# Patient Record
Sex: Female | Born: 1945 | Race: White | Hispanic: No | Smoking: Never smoker
Health system: Southern US, Community
[De-identification: ages and names within clinical notes are randomized; demographics above are authoritative.]

## PROBLEM LIST (undated history)

## (undated) DIAGNOSIS — G510 Bell's palsy: Secondary | ICD-10-CM

## (undated) DIAGNOSIS — R42 Dizziness and giddiness: Secondary | ICD-10-CM

## (undated) DIAGNOSIS — M1990 Unspecified osteoarthritis, unspecified site: Secondary | ICD-10-CM

## (undated) HISTORY — PX: COLONOSCOPY: SHX174

## (undated) HISTORY — PX: CARPAL TUNNEL RELEASE: SHX101

---

## 2005-10-04 ENCOUNTER — Ambulatory Visit: Payer: Self-pay | Admitting: Internal Medicine

## 2007-05-24 ENCOUNTER — Ambulatory Visit: Payer: Self-pay | Admitting: Internal Medicine

## 2008-06-17 ENCOUNTER — Ambulatory Visit: Payer: Self-pay | Admitting: Internal Medicine

## 2009-11-09 ENCOUNTER — Ambulatory Visit: Payer: Self-pay | Admitting: Internal Medicine

## 2010-11-10 ENCOUNTER — Ambulatory Visit: Payer: Self-pay | Admitting: Internal Medicine

## 2011-12-07 ENCOUNTER — Ambulatory Visit: Payer: Self-pay | Admitting: Internal Medicine

## 2011-12-14 ENCOUNTER — Ambulatory Visit: Payer: Self-pay | Admitting: Internal Medicine

## 2012-08-16 ENCOUNTER — Encounter (HOSPITAL_COMMUNITY): Payer: Self-pay | Admitting: Emergency Medicine

## 2012-08-16 ENCOUNTER — Emergency Department (HOSPITAL_COMMUNITY): Payer: Medicare Other

## 2012-08-16 ENCOUNTER — Inpatient Hospital Stay (HOSPITAL_COMMUNITY)
Admission: EM | Admit: 2012-08-16 | Discharge: 2012-08-16 | DRG: 074 | Disposition: A | Discharge status: Home / Self Care | Payer: Medicare Other | Attending: Emergency Medicine | Admitting: Emergency Medicine

## 2012-08-16 DIAGNOSIS — R4781 Slurred speech: Secondary | ICD-10-CM

## 2012-08-16 DIAGNOSIS — R531 Weakness: Secondary | ICD-10-CM | POA: Diagnosis present

## 2012-08-16 DIAGNOSIS — R29810 Facial weakness: Secondary | ICD-10-CM | POA: Diagnosis present

## 2012-08-16 DIAGNOSIS — R5381 Other malaise: Secondary | ICD-10-CM | POA: Diagnosis present

## 2012-08-16 DIAGNOSIS — M6281 Muscle weakness (generalized): Secondary | ICD-10-CM

## 2012-08-16 DIAGNOSIS — R4789 Other speech disturbances: Secondary | ICD-10-CM

## 2012-08-16 DIAGNOSIS — R5383 Other fatigue: Secondary | ICD-10-CM | POA: Diagnosis present

## 2012-08-16 DIAGNOSIS — G510 Bell's palsy: Principal | ICD-10-CM | POA: Diagnosis present

## 2012-08-16 LAB — BASIC METABOLIC PANEL
BUN: 10 mg/dL (ref 6–23)
Calcium: 9.9 mg/dL (ref 8.4–10.5)
GFR calc Af Amer: >90 mL/min (ref >90)
GFR calc non Af Amer: 85 mL/min — ABNORMAL LOW (ref >90)
Potassium: 4.1 mEq/L (ref 3.5–5.1)
Sodium: 138 mEq/L (ref 135–145)

## 2012-08-16 LAB — CBC WITH DIFFERENTIAL/PLATELET
Basophils Relative: 1 % (ref 0–1)
Eosinophils Absolute: 0.1 10*3/uL (ref 0.0–0.7)
MCH: 31.2 pg (ref 26.0–34.0)
MCHC: 35.5 g/dL (ref 30.0–36.0)
Monocytes Relative: 6 % (ref 3–12)
Neutrophils Relative %: 45 % (ref 43–77)
Platelets: 269 10*3/uL (ref 150–400)

## 2012-08-16 LAB — GLUCOSE, CAPILLARY: Glucose-Capillary: 109 mg/dL — ABNORMAL HIGH (ref 70–99)

## 2012-08-16 LAB — APTT: aPTT: 29 seconds (ref 24–37)

## 2012-08-16 MED ORDER — ARTIFICIAL TEARS OP OINT
TOPICAL_OINTMENT | Freq: Every day | OPHTHALMIC | Status: DC
  Filled 2012-08-16: qty 3.5

## 2012-08-16 MED ORDER — PREDNISONE 20 MG PO TABS
60.0000 mg | ORAL_TABLET | Freq: Once | ORAL | Status: AC
  Administered 2012-08-16: 60 mg via ORAL
  Filled 2012-08-16: qty 3

## 2012-08-16 MED ORDER — REFRESH P.M. OP OINT: TOPICAL_OINTMENT | OPHTHALMIC | Status: DC | PRN

## 2012-08-16 MED ORDER — PREDNISONE (PAK) 10 MG PO TABS: ORAL_TABLET | ORAL | Status: DC

## 2012-08-16 MED ORDER — ACETAMINOPHEN 325 MG PO TABS
650.0000 mg | ORAL_TABLET | Freq: Once | ORAL | Status: AC
  Administered 2012-08-16: 650 mg via ORAL
  Filled 2012-08-16: qty 2

## 2012-08-16 NOTE — Discharge Summary (Addendum)
Physician Discharge Summary  Anita Reilly ZOX:[id number] DOB: 02-Jan-1946 DOA: 08/16/2012  PCP: Dr. Dareen Piano in [place] Admit date: 08/16/2012 Discharge date: 08/16/2012  Recommendations for Outpatient Follow-up:  1. Follow up with PCP in 2-3 days. Take prednisone as directed. Tape right eye closed . [place] NS drops for eye   Discharge Diagnoses:  Principal Problem:  *Bell's palsy Active Problems:  Right sided weakness  Facial droop   Discharge Condition: Pt stable, non-toxic. Good condition for discharge to home  Diet recommendation: regular  Filed Weights   08/16/12 1240  Weight: 81.194 kg (179 lb)    History of present illness:  Very pleasant 66 yo with no significant medical hx presents to Ed with cc right sided facial numbness/tingling/droop and slurred speech. Info obtained from pt and family at bedside. Reports yesterday afternoon developed numbness in right side of tongue and cheek. Over next several hours the numbness progressed to lips, eye, and right facial droop developed with slurred speech. Denies right arm/leg numbness, tingling, weakness. Denies difficulty swallowing. Denies headache, fever, chills nausea. Denies sick contacts or recent illness. Report right eye began watering. Also reports generalized weakness "just feeling tired". In ED Labs unremarkable, CT head negative and MRI no acute stroke, intracranial hemorrhage, mass lesion, hydrocephalus, or extra-axial fluid.   Hospital Course:  Right sided facial drop and decrease taste sensation on 2/3 anterior aspect of her tongue: most likely secondary to Bells palsy: Will provide with prednisone taper. Recommend follow up with PCP in 2-3 days. If symptoms worsen or change has been instructed to return to ED. Will also add artificial tears, Eye pad and night artificial tears ointment for comfort and to prevent cornea abrasion due to dry eye.  Right sided weakness: on exam no significant difference in extremity strength.  Gait steady.   Mild dysarthria : secondary to decrease articulation due to bells palsy  Procedures: CT head w/o contrast (Negative noncontrast head CT.  MRI Brain w/o contrast (Mild age related changes. No acute intracranial findings).    Consultations:  none  Discharge Exam: Filed Vitals:   08/16/12 1240 08/16/12 1304 08/16/12 1557  BP: 150/89 142/67   Pulse: 76 67   Temp: 97.7 F (36.5 C)  98.7 F (37.1 C)  TempSrc: Oral    Resp: 20 18   Height: 5\' 4"  (1.626 m)    Weight: 81.194 kg (179 lb)    SpO2: 99% 100%     General: awake alert well nourished, NAD; mild erythema on his right eye and right facial droop. Cardiovascular: RRR, No MGR No LEE Respiratory: Normal effort. BSCTAB No wheeze, rhonchi Neuro: speech slurred. Obvious facial droop with right eye restricted eye closure. UE strength 4/5 L>R LE strength with L>R. Gait steady. No drift. Bedside swallow eval passed.   Discharge Instructions     Medication List     As of 08/16/2012  4:47 PM    TAKE these medications         CALTRATE 600+D PO   Take 1 tablet by mouth daily.      predniSONE 10 MG tablet   Commonly known as: STERAPRED UNI-PAK   Take 6 tabs 9/27, take 5 tabs 9/28 take 4 tabs 9/29 take 3 tabs 9/30 take 2 tabs 10/1 take 1 tab 10/2.          The results of significant diagnostics from this hospitalization (including imaging, microbiology, ancillary and laboratory) are listed below for reference.    Significant Diagnostic Studies: Ct Head Wo Contrast  08/16/2012  *RADIOLOGY REPORT*  Clinical Data: Acute onset right facial droop.  Possible stroke.  CT HEAD WITHOUT CONTRAST  Technique:  Contiguous axial images were obtained from the base of the skull through the vertex without contrast.  Comparison: None.  Findings: There is no evidence of intracranial hemorrhage, brain edema or other signs of acute infarction.  There is no evidence of intracranial mass lesion or mass effect.  No abnormal  extra-axial fluid collections are identified.  Ventricles are normal in size.  No other intracranial abnormality identified.  No evidence of skull fracture or other bone lesions.  IMPRESSION: Negative noncontrast head CT.   Original Report Authenticated By: Danae Orleans, M.D.    Mr Brain Wo Contrast  08/16/2012  *RADIOLOGY REPORT*  Clinical Data: Right Sided weakness with facial droop and slurred speech.  MRI HEAD WITHOUT CONTRAST  Technique:  Multiplanar, multiecho pulse sequences of the brain and surrounding structures were obtained according to standard protocol without intravenous contrast.  Comparison: CT head earlier today.  Findings: No acute stroke, intracranial hemorrhage, mass lesion, hydrocephalus, or extra-axial fluid.  Mild age related atrophy. Minimal white matter disease.  Dolichoectatic but patent cerebral vasculature.  Normal pituitary and cerebellar tonsils.  No acute sinus or mastoid disease. No change from prior unremarkable CT.  IMPRESSION: Mild age related changes.  No acute intracranial findings.   Original Report Authenticated By: Elsie Stain, M.D.     Labs: Basic Metabolic Panel:  Lab 08/16/12 0865  NA 138  K 4.1  CL 101  CO2 26  GLUCOSE 101*  BUN 10  CREATININE 0.76  CALCIUM 9.9  MG --  PHOS --   CBC: Lab 08/16/12 1245  WBC 4.8  NEUTROABS 2.2  HGB 15.0  HCT 42.2  MCV 87.7  PLT 269   CBG:  Lab 08/16/12 1250  GLUCAP 109*    Time coordinating discharge: 40  minutes  Signed:  Gwenyth Bender NP [facility] 08/16/2012, 4:47 PM  Patient seen and examined agree with physical exam, assessment and plan as documented above. Patient with typically Bell's palsy presentation. Had also negative CT and MRI to r/o life treating conditions like stroke. Will d/c home on prednisone tapering pack, eye pad, artificial tears and eye ointment to be applied at night.   Vassie Loll, MD [phone]

## 2012-08-16 NOTE — ED Notes (Addendum)
Patient states her last known normal was yesterday morning at 0800.  Patient states she began having a stiff neck and numbness to her right side.  Patient began having right sided facial drooping this morning, but c/o swelling on the right side of her face yesterday.  Patient is alert and oriented x 4, but has slurred speech and right sided weakness.  Patient denies trouble swallowing.

## 2012-08-16 NOTE — ED Notes (Signed)
Pt presenting to ed with c/o right side facial droop with slurred speech per pt's son at bedside. Pt is alert and oriented at this time. Pt states she also noticed right side numbness and right eye drainage. Pt states she also noticed unsteady gait today. Pt states onset yesterday with numbness

## 2012-08-16 NOTE — ED Provider Notes (Signed)
History     CSN: [id number]  Arrival date & time 08/16/12  1230   First MD Initiated Contact with Patient 08/16/12 1240      Chief Complaint  Patient presents with  . Weakness    (Consider location/radiation/quality/duration/timing/severity/associated sxs/prior treatment) HPI Comments: Patient reports that yesterday morning she began having a feeling of numbness in her tongue and the right side of her lip.  Then yesterday afternoon she began noticing that her face was drooping slightly.  Her symptoms then progressed.  At 8 AM this morning when she woke up she noticed that the right side of her face was numb and she was having blurred vision and clear drainage of her right eye.   She also stated that she felt generalized weakness.  At this time she feels that her gait is unsteady.  Her son states that he noticed that her speech was slurred.  Her son also stated that he noticed that she has been having some difficulty expressing herself and has been having some difficulty with word find.  She denies prior history of TIA or CVA.  No history of HTN, hyperlipidemia, or DM.  She denies headache.  Denies chest pain or SOB.    The history is provided by the patient.    History reviewed. No pertinent past medical history.  History reviewed. No pertinent past surgical history.  No family history on file.  History  Substance Use Topics  . Smoking status: Not on file  . Smokeless tobacco: Not on file  . Alcohol Use: Not on file    OB History    Grav Para Term Preterm Abortions TAB SAB Ect Mult Living                  Review of Systems  Constitutional: Negative for fever and chills.  HENT: Positive for neck pain. Negative for trouble swallowing.   Eyes: Positive for discharge and visual disturbance. Negative for pain and redness.  Respiratory: Negative for shortness of breath.   Cardiovascular: Negative for chest pain and palpitations.  Gastrointestinal: Negative for nausea and  vomiting.  Musculoskeletal: Positive for gait problem.  Neurological: Positive for dizziness, facial asymmetry, speech difficulty, weakness and numbness. Negative for tremors, syncope and headaches.  Psychiatric/Behavioral: Negative for confusion.    Allergies  Review of patient's allergies indicates not on file.  Home Medications  No current outpatient prescriptions on file.  BP 150/89  Pulse 76  Temp 97.7 F (36.5 C) (Oral)  Resp 20  Ht 5\' 4"  (1.626 m)  Wt 179 lb (81.194 kg)  BMI 30.73 kg/m2  SpO2 99%  Physical Exam  Nursing note and vitals reviewed. Constitutional: She is oriented to person, place, and time. She appears well-developed and well-nourished. No distress.  HENT:  Head: Normocephalic and atraumatic.  Mouth/Throat: Oropharynx is clear and moist.  Eyes: EOM are normal. Pupils are equal, round, and reactive to light.       Watery discharge of the right eye Patient able to close both of her eyes  Neck: Normal range of motion. Neck supple.  Cardiovascular: Normal rate, regular rhythm and normal heart sounds.   Pulmonary/Chest: Effort normal and breath sounds normal.  Neurological: She is alert and oriented to person, place, and time. A cranial nerve deficit and sensory deficit is present. Gait abnormal. Coordination normal.       Ataxia present Patient unable to raise her right eyebrow Left sided facial droop Speech mildly slurred. Decreased sensation on the right  side of the face UE muscle strength 3/5 right side, 5/5 left side LE muscle strength 3/5 right side, 5/5 right side   Skin: Skin is warm and dry. She is not diaphoretic.  Psychiatric: She has a normal mood and affect.    ED Course  Procedures (including critical care time)  Labs Reviewed - No data to display Ct Head Wo Contrast  08/16/2012  *RADIOLOGY REPORT*  Clinical Data: Acute onset right facial droop.  Possible stroke.  CT HEAD WITHOUT CONTRAST  Technique:  Contiguous axial images were  obtained from the base of the skull through the vertex without contrast.  Comparison: None.  Findings: There is no evidence of intracranial hemorrhage, brain edema or other signs of acute infarction.  There is no evidence of intracranial mass lesion or mass effect.  No abnormal extra-axial fluid collections are identified.  Ventricles are normal in size.  No other intracranial abnormality identified.  No evidence of skull fracture or other bone lesions.  IMPRESSION: Negative noncontrast head CT.   Original Report Authenticated By: Danae Orleans, M.D.    Mr Brain Wo Contrast  08/16/2012  *RADIOLOGY REPORT*  Clinical Data: Right Sided weakness with facial droop and slurred speech.  MRI HEAD WITHOUT CONTRAST  Technique:  Multiplanar, multiecho pulse sequences of the brain and surrounding structures were obtained according to standard protocol without intravenous contrast.  Comparison: CT head earlier today.  Findings: No acute stroke, intracranial hemorrhage, mass lesion, hydrocephalus, or extra-axial fluid.  Mild age related atrophy. Minimal white matter disease.  Dolichoectatic but patent cerebral vasculature.  Normal pituitary and cerebellar tonsils.  No acute sinus or mastoid disease. No change from prior unremarkable CT.  IMPRESSION: Mild age related changes.  No acute intracranial findings.   Original Report Authenticated By: Elsie Stain, M.D.      No diagnosis found.   Date: 08/16/2012  Rate: 73  Rhythm: normal sinus rhythm  QRS Axis: normal  Intervals: normal  ST/T Wave abnormalities: normal  Conduction Disutrbances:none  Narrative Interpretation:   Old EKG Reviewed: none available  Patient discussed with Dr. Oletta Lamas.    MDM  Patient presenting with a chief complaint of facial droop, slurred speech, numbness of the right side of her face, and weakness of the right UE and LE.  Onset of symptoms was yesterday so a code stroke was not called.  CT and MRI brain were both negative for stroke.   However, due to the symptoms involving both the face and the UE and LE the hospitalist was called for admission.          Pascal Lux [place], PA-C 08/17/12 1857

## 2012-08-16 NOTE — Progress Notes (Signed)
Anita Reilly, is a 66 y.o. female,   MRN: [id number]  -  DOB - 03-22-1946  Outpatient Primary MD for the patient is No primary provider on file.  in for    Chief Complaint  Patient presents with  . Weakness     Blood pressure 142/67, pulse 67, temperature 97.7 F (36.5 C), temperature source Oral, resp. rate 18, height 5\' 4"  (1.626 m), weight 81.194 kg (179 lb), SpO2 100.00%.  Principal Problem:  *Facial droop Active Problems:  Right sided weakness     Patient is delightful 66 yo with no significant medical hx who  reports that yesterday morning she began having a feeling of numbness in her tongue and the right side of her lip. Then yesterday afternoon she began noticing that her face was drooping slightly. Her symptoms then progressed. At 8 AM this morning when she woke up she noticed that the right side of her face was numb and she was having blurred vision and clear drainage of her right eye. She also stated that she felt generalized weakness. Her son states that he noticed that her speech was slurred and that the facial droop has worsened today. Denies HA, dizziness, CP, palpitation. Denies fever, chill, abdominal pain. Does report development of slight ache on right side of neck.   Work up in ED yield head ct negative noncontrast head CT.  MRI Mild age related changes. No acute intracranial findings. Labs unremarkable.   On exam, sitting up in bed with obvious facial droop. Right eye without blinking. Unable to raise right eye brow. Speech slurred. Reports feels as tho she got novacaine at dentist. Family reports progressive droop since last night. UE strength 4/5 L>R LE strenght 4/5 L>R  Will admit tele r/o cva, concern bells palsy

## 2012-08-16 NOTE — ED Notes (Signed)
Patient transported to CT 

## 2012-08-16 NOTE — Progress Notes (Signed)
Confirmed with pt that pcp is [facility] in [place] updated epic

## 2012-08-17 NOTE — ED Provider Notes (Signed)
Medical screening examination/treatment/procedure(s) were conducted as a shared visit with non-physician practitioner(s) and myself.  I personally evaluated the patient during the encounter  Pt with facial droop and numbness since yesterday.  On exam today, some assymetry to strength noted in right UE and LE compared to left 4+/5 versus 5/5.  I question effort on some of testing on examination.  MRI is neg suggesting no acute CVA.  Weakness and numbness persists per patient, will consult hospitalist to see and admit    Anita Reilly. Oletta Lamas, MD 08/17/12 1949

## 2012-08-20 NOTE — Discharge Summary (Signed)
See my d/c summary for details. Note and discussion with NP combined in that document.

## 2012-12-11 ENCOUNTER — Ambulatory Visit: Payer: Self-pay | Admitting: Internal Medicine

## 2013-12-30 ENCOUNTER — Ambulatory Visit: Payer: Self-pay | Admitting: Internal Medicine

## 2014-11-21 DIAGNOSIS — G510 Bell's palsy: Secondary | ICD-10-CM

## 2014-11-21 HISTORY — DX: Bell's palsy: G51.0

## 2015-02-23 ENCOUNTER — Ambulatory Visit
Admit: 2015-02-23 | Disposition: A | Discharge status: Home / Self Care | Payer: Self-pay | Attending: Internal Medicine | Admitting: Internal Medicine

## 2015-12-09 ENCOUNTER — Other Ambulatory Visit: Payer: Self-pay | Admitting: Unknown Physician Specialty

## 2015-12-09 DIAGNOSIS — N63 Unspecified lump in unspecified breast: Secondary | ICD-10-CM

## 2015-12-21 ENCOUNTER — Ambulatory Visit
Admission: RE | Admit: 2015-12-21 | Discharge: 2015-12-21 | Disposition: A | Discharge status: Home / Self Care | Payer: Medicare Other | Source: Ambulatory Visit | Attending: Unknown Physician Specialty | Admitting: Unknown Physician Specialty

## 2015-12-21 ENCOUNTER — Other Ambulatory Visit: Payer: Self-pay | Admitting: Unknown Physician Specialty

## 2015-12-21 DIAGNOSIS — N6002 Solitary cyst of left breast: Secondary | ICD-10-CM | POA: Insufficient documentation

## 2015-12-21 DIAGNOSIS — N63 Unspecified lump in unspecified breast: Secondary | ICD-10-CM

## 2015-12-21 DIAGNOSIS — N644 Mastodynia: Secondary | ICD-10-CM | POA: Diagnosis present

## 2015-12-28 ENCOUNTER — Other Ambulatory Visit: Payer: Self-pay | Admitting: Internal Medicine

## 2015-12-28 DIAGNOSIS — N6002 Solitary cyst of left breast: Secondary | ICD-10-CM

## 2016-02-11 ENCOUNTER — Ambulatory Visit
Admission: RE | Admit: 2016-02-11 | Discharge: 2016-02-11 | Disposition: A | Discharge status: Home / Self Care | Payer: Medicare Other | Source: Ambulatory Visit | Attending: Internal Medicine | Admitting: Internal Medicine

## 2016-02-11 DIAGNOSIS — N6002 Solitary cyst of left breast: Secondary | ICD-10-CM | POA: Insufficient documentation

## 2016-02-17 ENCOUNTER — Other Ambulatory Visit: Payer: Self-pay | Admitting: Internal Medicine

## 2016-02-17 DIAGNOSIS — N63 Unspecified lump in unspecified breast: Secondary | ICD-10-CM

## 2016-03-16 ENCOUNTER — Ambulatory Visit
Admission: RE | Admit: 2016-03-16 | Discharge: 2016-03-16 | Disposition: A | Discharge status: Home / Self Care | Payer: Medicare Other | Source: Ambulatory Visit | Attending: Internal Medicine | Admitting: Internal Medicine

## 2016-03-16 DIAGNOSIS — N63 Unspecified lump in unspecified breast: Secondary | ICD-10-CM

## 2016-03-16 DIAGNOSIS — R928 Other abnormal and inconclusive findings on diagnostic imaging of breast: Secondary | ICD-10-CM | POA: Diagnosis not present

## 2016-08-05 ENCOUNTER — Encounter: Payer: Self-pay | Admitting: Internal Medicine

## 2017-02-07 ENCOUNTER — Other Ambulatory Visit: Payer: Self-pay | Admitting: Internal Medicine

## 2017-02-07 DIAGNOSIS — Z1231 Encounter for screening mammogram for malignant neoplasm of breast: Secondary | ICD-10-CM

## 2017-03-27 ENCOUNTER — Ambulatory Visit: Payer: Medicare Other

## 2017-04-12 ENCOUNTER — Ambulatory Visit
Admission: RE | Admit: 2017-04-12 | Discharge: 2017-04-12 | Disposition: A | Discharge status: Home / Self Care | Payer: Medicare Other | Source: Ambulatory Visit | Attending: Internal Medicine | Admitting: Internal Medicine

## 2017-04-12 DIAGNOSIS — Z1231 Encounter for screening mammogram for malignant neoplasm of breast: Secondary | ICD-10-CM | POA: Insufficient documentation

## 2017-12-26 ENCOUNTER — Other Ambulatory Visit: Payer: Self-pay | Admitting: Physician Assistant

## 2017-12-26 ENCOUNTER — Ambulatory Visit
Admission: RE | Admit: 2017-12-26 | Discharge: 2017-12-26 | Disposition: A | Discharge status: Home / Self Care | Payer: Medicare Other | Source: Ambulatory Visit | Attending: Physician Assistant | Admitting: Physician Assistant

## 2017-12-26 DIAGNOSIS — M25562 Pain in left knee: Secondary | ICD-10-CM | POA: Insufficient documentation

## 2017-12-26 DIAGNOSIS — M79605 Pain in left leg: Secondary | ICD-10-CM

## 2017-12-26 DIAGNOSIS — M7989 Other specified soft tissue disorders: Secondary | ICD-10-CM | POA: Diagnosis present

## 2018-04-06 ENCOUNTER — Other Ambulatory Visit: Payer: Self-pay | Admitting: Internal Medicine

## 2018-04-06 DIAGNOSIS — Z1231 Encounter for screening mammogram for malignant neoplasm of breast: Secondary | ICD-10-CM

## 2018-05-08 ENCOUNTER — Ambulatory Visit
Admission: RE | Admit: 2018-05-08 | Discharge: 2018-05-08 | Disposition: A | Discharge status: Home / Self Care | Payer: Medicare Other | Source: Ambulatory Visit | Attending: Internal Medicine | Admitting: Internal Medicine

## 2018-05-08 DIAGNOSIS — Z1231 Encounter for screening mammogram for malignant neoplasm of breast: Secondary | ICD-10-CM

## 2018-09-11 ENCOUNTER — Encounter: Payer: Self-pay | Admitting: *Deleted

## 2018-09-11 ENCOUNTER — Other Ambulatory Visit: Payer: Self-pay

## 2018-09-17 NOTE — Discharge Instructions (Signed)

## 2018-09-19 ENCOUNTER — Encounter
Admission: RE | Disposition: A | Discharge status: Home / Self Care | Payer: Self-pay | Source: Ambulatory Visit | Attending: Ophthalmology

## 2018-09-19 ENCOUNTER — Ambulatory Visit: Payer: Medicare Other | Admitting: Anesthesiology

## 2018-09-19 ENCOUNTER — Ambulatory Visit
Admission: RE | Admit: 2018-09-19 | Discharge: 2018-09-19 | Disposition: A | Discharge status: Home / Self Care | Payer: Medicare Other | Source: Ambulatory Visit | Attending: Ophthalmology | Admitting: Ophthalmology

## 2018-09-19 DIAGNOSIS — H2511 Age-related nuclear cataract, right eye: Secondary | ICD-10-CM | POA: Diagnosis not present

## 2018-09-19 HISTORY — PX: CATARACT EXTRACTION W/PHACO: SHX586

## 2018-09-19 HISTORY — DX: Dizziness and giddiness: R42

## 2018-09-19 HISTORY — DX: Unspecified osteoarthritis, unspecified site: M19.90

## 2018-09-19 HISTORY — DX: Bell's palsy: G51.0

## 2018-09-19 SURGERY — PHACOEMULSIFICATION, CATARACT, WITH IOL INSERTION
Anesthesia: Monitor Anesthesia Care | Site: Eye | Laterality: Right

## 2018-09-19 MED ORDER — BRIMONIDINE TARTRATE-TIMOLOL 0.2-0.5 % OP SOLN
OPHTHALMIC | Status: DC | PRN
  Administered 2018-09-19: 1 [drp] via OPHTHALMIC

## 2018-09-19 MED ORDER — EPINEPHRINE PF 1 MG/ML IJ SOLN
INTRAOCULAR | Status: DC | PRN
  Administered 2018-09-19: 71 mL via OPHTHALMIC

## 2018-09-19 MED ORDER — LACTATED RINGERS IV SOLN: INTRAVENOUS | Status: DC

## 2018-09-19 MED ORDER — LACTATED RINGERS IV SOLN: 500.0000 mL | INTRAVENOUS | Status: DC

## 2018-09-19 MED ORDER — TETRACAINE HCL 0.5 % OP SOLN
1.0000 [drp] | OPHTHALMIC | Status: DC | PRN
  Administered 2018-09-19 (×2): 1 [drp] via OPHTHALMIC

## 2018-09-19 MED ORDER — FENTANYL CITRATE (PF) 100 MCG/2ML IJ SOLN
INTRAMUSCULAR | Status: DC | PRN
  Administered 2018-09-19: 50 ug via INTRAVENOUS

## 2018-09-19 MED ORDER — NA HYALUR & NA CHOND-NA HYALUR 0.4-0.35 ML IO KIT
PACK | INTRAOCULAR | Status: DC | PRN
  Administered 2018-09-19: 1 mL via INTRAOCULAR

## 2018-09-19 MED ORDER — CEFUROXIME OPHTHALMIC INJECTION 1 MG/0.1 ML
INJECTION | OPHTHALMIC | Status: DC | PRN
  Administered 2018-09-19: 0.1 mL via INTRACAMERAL

## 2018-09-19 MED ORDER — MIDAZOLAM HCL 2 MG/2ML IJ SOLN
INTRAMUSCULAR | Status: DC | PRN
  Administered 2018-09-19: 2 mg via INTRAVENOUS

## 2018-09-19 MED ORDER — DEXAMETHASONE SODIUM PHOSPHATE 4 MG/ML IJ SOLN: 8.0000 mg | Freq: Once | INTRAMUSCULAR | Status: DC | PRN

## 2018-09-19 MED ORDER — MOXIFLOXACIN HCL 0.5 % OP SOLN
1.0000 [drp] | OPHTHALMIC | Status: DC | PRN
  Administered 2018-09-19 (×3): 1 [drp] via OPHTHALMIC

## 2018-09-19 MED ORDER — ARMC OPHTHALMIC DILATING DROPS
1.0000 "application " | OPHTHALMIC | Status: DC | PRN
  Administered 2018-09-19 (×2): 1 via OPHTHALMIC

## 2018-09-19 MED ORDER — LIDOCAINE HCL (PF) 2 % IJ SOLN
INTRAOCULAR | Status: DC | PRN
  Administered 2018-09-19: .5 mL

## 2018-09-19 SURGICAL SUPPLY — 25 items

## 2018-09-19 NOTE — Anesthesia Procedure Notes (Signed)
Procedure Name: MAC Date/Time: 09/19/2018 12:09 PM Performed by: Janna Arch, CRNA Pre-anesthesia Checklist: Patient identified, Emergency Drugs available, Suction available, Timeout performed and Patient being monitored Patient Re-evaluated:Patient Re-evaluated prior to induction Oxygen Delivery Method: Nasal cannula Placement Confirmation: positive ETCO2

## 2018-09-19 NOTE — Anesthesia Preprocedure Evaluation (Signed)
Anesthesia Evaluation  Patient identified by MRN, date of birth, ID band Patient awake    Reviewed: Allergy & Precautions, NPO status , Patient's Chart, lab work & pertinent test results  History of Anesthesia Complications Negative for: history of anesthetic complications  Airway Mallampati: I  TM Distance: >3 FB Neck ROM: Full    Dental  (+)    Pulmonary neg pulmonary ROS,    Pulmonary exam normal breath sounds clear to auscultation       Cardiovascular Exercise Tolerance: Good negative cardio ROS Normal cardiovascular exam Rhythm:Regular Rate:Normal     Neuro/Psych PSYCHIATRIC DISORDERS Depression Vertigo   Neuromuscular disease (hx Bell's palsy)    GI/Hepatic GERD  ,  Endo/Other  negative endocrine ROS  Renal/GU negative Renal ROS     Musculoskeletal  (+) Arthritis , Osteoarthritis,    Abdominal   Peds  Hematology negative hematology ROS (+)   Anesthesia Other Findings   Reproductive/Obstetrics                             Anesthesia Physical Anesthesia Plan  ASA: II  Anesthesia Plan: MAC   Post-op Pain Management:    Induction: Intravenous  PONV Risk Score and Plan: 2 and TIVA and Midazolam  Airway Management Planned: Natural Airway  Additional Equipment:   Intra-op Plan:   Post-operative Plan:   Informed Consent: I have reviewed the patients History and Physical, chart, labs and discussed the procedure including the risks, benefits and alternatives for the proposed anesthesia with the patient or authorized representative who has indicated his/her understanding and acceptance.     Plan Discussed with: CRNA  Anesthesia Plan Comments:         Anesthesia Quick Evaluation

## 2018-09-19 NOTE — H&P (Signed)
The History and Physical notes are on paper, have been signed, and are to be scanned. The patient remains stable and unchanged from the H&P.   Previous H&P reviewed, patient examined, and there are no changes.  Anita Reilly 09/19/2018 11:38 AM

## 2018-09-19 NOTE — Transfer of Care (Signed)
Immediate Anesthesia Transfer of Care Note  Patient: Anita Reilly  Procedure(s) Performed: CATARACT EXTRACTION PHACO AND INTRAOCULAR LENS PLACEMENT (IOC) RIGHT (Right Eye)  Patient Location: PACU  Anesthesia Type: MAC  Level of Consciousness: awake, alert  and patient cooperative  Airway and Oxygen Therapy: Patient Spontanous Breathing and Patient connected to supplemental oxygen  Post-op Assessment: Post-op Vital signs reviewed, Patient's Cardiovascular Status Stable, Respiratory Function Stable, Patent Airway and No signs of Nausea or vomiting  Post-op Vital Signs: Reviewed and stable  Complications: No apparent anesthesia complications

## 2018-09-19 NOTE — Op Note (Signed)
LOCATION:  [facility]   PREOPERATIVE DIAGNOSIS:    Nuclear sclerotic cataract right eye. H25.11   POSTOPERATIVE DIAGNOSIS:  Nuclear sclerotic cataract right eye.     PROCEDURE:  Phacoemusification with posterior chamber intraocular lens placement of the right eye   LENS:   Implant Name Type Inv. Item Serial No. Manufacturer Lot No. LRB No. Used  LENS IOL DIOP 21.0 - [id number] Intraocular Lens LENS IOL DIOP 21.0 [id number]  Right 1        ULTRASOUND TIME: 21 % of 1 minutes, 22 seconds.  CDE 17.3   SURGEON:  Deirdre Evener, MD   ANESTHESIA:  Topical with tetracaine drops and 2% Xylocaine jelly, augmented with 1% preservative-free intracameral lidocaine.    COMPLICATIONS:  None.   DESCRIPTION OF PROCEDURE:  The patient was identified in the holding room and transported to the operating room and placed in the supine position under the operating microscope.  The right eye was identified as the operative eye and it was prepped and draped in the usual sterile ophthalmic fashion.   A 1 millimeter clear-corneal paracentesis was made at the 12:00 position.  0.5 ml of preservative-free 1% lidocaine was injected into the anterior chamber. The anterior chamber was filled with Viscoat viscoelastic.  A 2.4 millimeter keratome was used to make a near-clear corneal incision at the 9:00 position.  A curvilinear capsulorrhexis was made with a cystotome and capsulorrhexis forceps.  Balanced salt solution was used to hydrodissect and hydrodelineate the nucleus.   Phacoemulsification was then used in stop and chop fashion to remove the lens nucleus and epinucleus.  The remaining cortex was then removed using the irrigation and aspiration handpiece. Provisc was then placed into the capsular bag to distend it for lens placement.  A lens was then injected into the capsular bag.  The remaining viscoelastic was aspirated.   Wounds were hydrated with balanced salt solution.  The anterior  chamber was inflated to a physiologic pressure with balanced salt solution.  No wound leaks were noted. Cefuroxime 0.1 ml of a 10mg /ml solution was injected into the anterior chamber for a dose of 1 mg of intracameral antibiotic at the completion of the case.   Timolol and Brimonidine drops were applied to the eye.  The patient was taken to the recovery room in stable condition without complications of anesthesia or surgery.   Anita Reilly 09/19/2018, 12:30 PM

## 2018-09-19 NOTE — Anesthesia Postprocedure Evaluation (Signed)
Anesthesia Post Note  Patient: Anita Reilly  Procedure(s) Performed: CATARACT EXTRACTION PHACO AND INTRAOCULAR LENS PLACEMENT ([place]) RIGHT (Right Eye)  Patient location during evaluation: PACU Anesthesia Type: MAC Level of consciousness: awake and alert, oriented and patient cooperative Pain management: pain level controlled Vital Signs Assessment: post-procedure vital signs reviewed and stable Respiratory status: spontaneous breathing, nonlabored ventilation and respiratory function stable Cardiovascular status: blood pressure returned to baseline and stable Postop Assessment: adequate PO intake Anesthetic complications: no    Darrin Nipper

## 2018-09-20 ENCOUNTER — Encounter: Payer: Self-pay | Admitting: Ophthalmology

## 2018-10-04 ENCOUNTER — Encounter: Payer: Self-pay | Admitting: *Deleted

## 2018-10-04 ENCOUNTER — Other Ambulatory Visit: Payer: Self-pay

## 2018-10-04 NOTE — Discharge Instructions (Signed)

## 2018-10-10 ENCOUNTER — Ambulatory Visit: Payer: Medicare Other | Admitting: Anesthesiology

## 2018-10-10 ENCOUNTER — Encounter
Admission: RE | Disposition: A | Discharge status: Home / Self Care | Payer: Self-pay | Source: Ambulatory Visit | Attending: Ophthalmology

## 2018-10-10 ENCOUNTER — Encounter: Payer: Self-pay | Admitting: Anesthesiology

## 2018-10-10 ENCOUNTER — Ambulatory Visit
Admission: RE | Admit: 2018-10-10 | Discharge: 2018-10-10 | Disposition: A | Discharge status: Home / Self Care | Payer: Medicare Other | Source: Ambulatory Visit | Attending: Ophthalmology | Admitting: Ophthalmology

## 2018-10-10 DIAGNOSIS — Z9849 Cataract extraction status, unspecified eye: Secondary | ICD-10-CM | POA: Insufficient documentation

## 2018-10-10 DIAGNOSIS — Z888 Allergy status to other drugs, medicaments and biological substances status: Secondary | ICD-10-CM | POA: Insufficient documentation

## 2018-10-10 DIAGNOSIS — Z882 Allergy status to sulfonamides status: Secondary | ICD-10-CM | POA: Insufficient documentation

## 2018-10-10 DIAGNOSIS — M19011 Primary osteoarthritis, right shoulder: Secondary | ICD-10-CM | POA: Diagnosis not present

## 2018-10-10 DIAGNOSIS — M19012 Primary osteoarthritis, left shoulder: Secondary | ICD-10-CM | POA: Insufficient documentation

## 2018-10-10 DIAGNOSIS — Z8669 Personal history of other diseases of the nervous system and sense organs: Secondary | ICD-10-CM | POA: Insufficient documentation

## 2018-10-10 DIAGNOSIS — H2512 Age-related nuclear cataract, left eye: Secondary | ICD-10-CM | POA: Diagnosis present

## 2018-10-10 DIAGNOSIS — M1990 Unspecified osteoarthritis, unspecified site: Secondary | ICD-10-CM | POA: Insufficient documentation

## 2018-10-10 HISTORY — PX: CATARACT EXTRACTION W/PHACO: SHX586

## 2018-10-10 SURGERY — PHACOEMULSIFICATION, CATARACT, WITH IOL INSERTION
Anesthesia: Monitor Anesthesia Care | Laterality: Left

## 2018-10-10 MED ORDER — FENTANYL CITRATE (PF) 100 MCG/2ML IJ SOLN
INTRAMUSCULAR | Status: DC | PRN
  Administered 2018-10-10: 50 ug via INTRAVENOUS

## 2018-10-10 MED ORDER — TETRACAINE HCL 0.5 % OP SOLN
1.0000 [drp] | OPHTHALMIC | Status: DC | PRN
  Administered 2018-10-10 (×2): 1 [drp] via OPHTHALMIC

## 2018-10-10 MED ORDER — EPINEPHRINE PF 1 MG/ML IJ SOLN
INTRAOCULAR | Status: DC | PRN
  Administered 2018-10-10: 60 mL via OPHTHALMIC

## 2018-10-10 MED ORDER — NA HYALUR & NA CHOND-NA HYALUR 0.4-0.35 ML IO KIT
PACK | INTRAOCULAR | Status: DC | PRN
  Administered 2018-10-10: 1 mL via INTRAOCULAR

## 2018-10-10 MED ORDER — ARMC OPHTHALMIC DILATING DROPS
1.0000 "application " | OPHTHALMIC | Status: DC | PRN
  Administered 2018-10-10 (×3): 1 via OPHTHALMIC

## 2018-10-10 MED ORDER — MOXIFLOXACIN HCL 0.5 % OP SOLN
1.0000 [drp] | OPHTHALMIC | Status: DC | PRN
  Administered 2018-10-10 (×3): 1 [drp] via OPHTHALMIC

## 2018-10-10 MED ORDER — LIDOCAINE HCL (PF) 2 % IJ SOLN
INTRAOCULAR | Status: DC | PRN
  Administered 2018-10-10: 1 mL via INTRAMUSCULAR

## 2018-10-10 MED ORDER — ONDANSETRON HCL 4 MG/2ML IJ SOLN: 4.0000 mg | Freq: Once | INTRAMUSCULAR | Status: DC | PRN

## 2018-10-10 MED ORDER — BRIMONIDINE TARTRATE-TIMOLOL 0.2-0.5 % OP SOLN
OPHTHALMIC | Status: DC | PRN
  Administered 2018-10-10: 1 [drp] via OPHTHALMIC

## 2018-10-10 MED ORDER — LACTATED RINGERS IV SOLN: 10.0000 mL/h | INTRAVENOUS | Status: DC

## 2018-10-10 MED ORDER — CEFUROXIME OPHTHALMIC INJECTION 1 MG/0.1 ML
INJECTION | OPHTHALMIC | Status: DC | PRN
  Administered 2018-10-10: 1 mg via INTRACAMERAL

## 2018-10-10 MED ORDER — MIDAZOLAM HCL 2 MG/2ML IJ SOLN
INTRAMUSCULAR | Status: DC | PRN
  Administered 2018-10-10: 2 mg via INTRAVENOUS

## 2018-10-10 SURGICAL SUPPLY — 27 items
CANNULA ANT/CHMB 27G (MISCELLANEOUS) ×1 IMPLANT
CANNULA ANT/CHMB 27GA (MISCELLANEOUS) ×3 IMPLANT
CARTRIDGE ABBOTT (MISCELLANEOUS) IMPLANT
GLOVE SURG LX 7.5 STRW (GLOVE) ×2
GLOVE SURG LX STRL 7.5 STRW (GLOVE) ×1 IMPLANT
GLOVE SURG TRIUMPH 8.0 PF LTX (GLOVE) ×3 IMPLANT
GOWN STRL REUS W/ TWL LRG LVL3 (GOWN DISPOSABLE) ×2 IMPLANT
GOWN STRL REUS W/TWL LRG LVL3 (GOWN DISPOSABLE) ×4
LENS IOL TECNIS ITEC 20.5 (Intraocular Lens) ×2 IMPLANT
MARKER SKIN DUAL TIP RULER LAB (MISCELLANEOUS) ×3 IMPLANT
NDL FILTER BLUNT 18X1 1/2 (NEEDLE) ×1 IMPLANT
NDL RETROBULBAR .5 NSTRL (NEEDLE) IMPLANT
NEEDLE FILTER BLUNT 18X 1/2SAF (NEEDLE) ×2
NEEDLE FILTER BLUNT 18X1 1/2 (NEEDLE) ×1 IMPLANT
PACK CATARACT BRASINGTON (MISCELLANEOUS) ×3 IMPLANT
PACK EYE AFTER SURG (MISCELLANEOUS) ×3 IMPLANT
PACK OPTHALMIC (MISCELLANEOUS) ×3 IMPLANT
RING MALYGIN 7.0 (MISCELLANEOUS) IMPLANT
SUT ETHILON 10-0 CS-B-6CS-B-6 (SUTURE)
SUT VICRYL  9 0 (SUTURE)
SUT VICRYL 9 0 (SUTURE) IMPLANT
SUTURE EHLN 10-0 CS-B-6CS-B-6 (SUTURE) IMPLANT
SYR 3ML LL SCALE MARK (SYRINGE) ×3 IMPLANT
SYR 5ML LL (SYRINGE) ×3 IMPLANT
SYR TB 1ML LUER SLIP (SYRINGE) ×3 IMPLANT
WATER STERILE IRR 500ML POUR (IV SOLUTION) ×3 IMPLANT
WIPE NON LINTING 3.25X3.25 (MISCELLANEOUS) ×3 IMPLANT

## 2018-10-10 NOTE — Anesthesia Procedure Notes (Signed)
Procedure Name: MAC Date/Time: 10/10/2018 11:55 AM Performed by: Janna Arch, CRNA Pre-anesthesia Checklist: Patient identified, Emergency Drugs available, Suction available, Timeout performed and Patient being monitored Patient Re-evaluated:Patient Re-evaluated prior to induction Oxygen Delivery Method: Nasal cannula Placement Confirmation: positive ETCO2

## 2018-10-10 NOTE — Op Note (Signed)
OPERATIVE NOTE  Charlotte CrumbJanet Isley Rotunno [id number] 10/10/2018   PREOPERATIVE DIAGNOSIS:  Nuclear sclerotic cataract left eye. H25.12   POSTOPERATIVE DIAGNOSIS:    Nuclear sclerotic cataract left eye.     PROCEDURE:  Phacoemusification with posterior chamber intraocular lens placement of the left eye   LENS:   Implant Name Type Inv. Item Serial No. Manufacturer Lot No. LRB No. Used  tecnis aspheric IOL Intraocular Lens  [id number] [employer]  Left 1        ULTRASOUND TIME: 17  % of 0 minutes 42 seconds, CDE 7.1  SURGEON:  Deirdre Evenerhadwick R. Mikyla Schachter, MD   ANESTHESIA:  Topical with tetracaine drops and 2% Xylocaine jelly, augmented with 1% preservative-free intracameral lidocaine.    COMPLICATIONS:  None.   DESCRIPTION OF PROCEDURE:  The patient was identified in the holding room and transported to the operating room and placed in the supine position under the operating microscope.  The left eye was identified as the operative eye and it was prepped and draped in the usual sterile ophthalmic fashion.   A 1 millimeter clear-corneal paracentesis was made at the 1:30 position.  0.5 ml of preservative-free 1% lidocaine was injected into the anterior chamber.  The anterior chamber was filled with Viscoat viscoelastic.  A 2.4 millimeter keratome was used to make a near-clear corneal incision at the 10:30 position.  .  A curvilinear capsulorrhexis was made with a cystotome and capsulorrhexis forceps.  Balanced salt solution was used to hydrodissect and hydrodelineate the nucleus.   Phacoemulsification was then used in stop and chop fashion to remove the lens nucleus and epinucleus.  The remaining cortex was then removed using the irrigation and aspiration handpiece. Provisc was then placed into the capsular bag to distend it for lens placement.  A lens was then injected into the capsular bag.  The remaining viscoelastic was aspirated.   Wounds were hydrated with balanced salt solution.  The  anterior chamber was inflated to a physiologic pressure with balanced salt solution.  No wound leaks were noted. Cefuroxime 0.1 ml of a 10mg /ml solution was injected into the anterior chamber for a dose of 1 mg of intracameral antibiotic at the completion of the case.   Timolol and Brimonidine drops were applied to the eye.  The patient was taken to the recovery room in stable condition without complications of anesthesia or surgery.  Lailah Marcelli 10/10/2018, 12:11 PM

## 2018-10-10 NOTE — Anesthesia Postprocedure Evaluation (Signed)
Anesthesia Post Note  Patient: Anita Reilly  Procedure(s) Performed: CATARACT EXTRACTION PHACO AND INTRAOCULAR LENS PLACEMENT (IOC)  LEFT (Left )  Patient location during evaluation: PACU Anesthesia Type: MAC Level of consciousness: awake and alert Pain management: pain level controlled Vital Signs Assessment: post-procedure vital signs reviewed and stable Respiratory status: spontaneous breathing, nonlabored ventilation, respiratory function stable and patient connected to nasal cannula oxygen Cardiovascular status: stable and blood pressure returned to baseline Postop Assessment: no apparent nausea or vomiting Anesthetic complications: no    Daeja Helderman

## 2018-10-10 NOTE — Anesthesia Preprocedure Evaluation (Signed)
Anesthesia Evaluation  Patient identified by MRN, date of birth, ID band  Reviewed: NPO status   History of Anesthesia Complications Negative for: history of anesthetic complications  Airway Mallampati: II  TM Distance: >3 FB Neck ROM: full    Dental no notable dental hx.    Pulmonary neg pulmonary ROS,    Pulmonary exam normal        Cardiovascular Exercise Tolerance: Good negative cardio ROS Normal cardiovascular exam     Neuro/Psych  Neuromuscular disease (R bell's palsy : 2014) negative psych ROS   GI/Hepatic negative GI ROS, Neg liver ROS,   Endo/Other  negative endocrine ROS  Renal/GU negative Renal ROS  negative genitourinary   Musculoskeletal  (+) Arthritis ,   Abdominal   Peds  Hematology negative hematology ROS (+)   Anesthesia Other Findings   Reproductive/Obstetrics                             Anesthesia Physical Anesthesia Plan  ASA: II  Anesthesia Plan: MAC   Post-op Pain Management:    Induction:   PONV Risk Score and Plan:   Airway Management Planned:   Additional Equipment:   Intra-op Plan:   Post-operative Plan:   Informed Consent: I have reviewed the patients History and Physical, chart, labs and discussed the procedure including the risks, benefits and alternatives for the proposed anesthesia with the patient or authorized representative who has indicated his/her understanding and acceptance.     Plan Discussed with: CRNA  Anesthesia Plan Comments:         Anesthesia Quick Evaluation

## 2018-10-10 NOTE — H&P (Signed)
The History and Physical notes are on paper, have been signed, and are to be scanned. The patient remains stable and unchanged from the H&P.   Previous H&P reviewed, patient examined, and there are no changes.  Anita Reilly 10/10/2018 11:11 AM

## 2018-10-10 NOTE — Transfer of Care (Signed)
Immediate Anesthesia Transfer of Care Note  Patient: Anita Reilly  Procedure(s) Performed: CATARACT EXTRACTION PHACO AND INTRAOCULAR LENS PLACEMENT (IOC)  LEFT (Left )  Patient Location: PACU  Anesthesia Type: MAC  Level of Consciousness: awake, alert  and patient cooperative  Airway and Oxygen Therapy: Patient Spontanous Breathing and Patient connected to supplemental oxygen  Post-op Assessment: Post-op Vital signs reviewed, Patient's Cardiovascular Status Stable, Respiratory Function Stable, Patent Airway and No signs of Nausea or vomiting  Post-op Vital Signs: Reviewed and stable  Complications: No apparent anesthesia complications

## 2018-10-11 ENCOUNTER — Encounter: Payer: Self-pay | Admitting: Ophthalmology

## 2019-07-09 ENCOUNTER — Other Ambulatory Visit: Payer: Self-pay | Admitting: Internal Medicine

## 2019-07-09 DIAGNOSIS — Z1231 Encounter for screening mammogram for malignant neoplasm of breast: Secondary | ICD-10-CM

## 2020-02-04 ENCOUNTER — Ambulatory Visit
Admission: RE | Admit: 2020-02-04 | Discharge: 2020-02-04 | Disposition: A | Discharge status: Home / Self Care | Payer: Medicare Other | Source: Ambulatory Visit | Attending: Internal Medicine | Admitting: Internal Medicine

## 2020-02-04 DIAGNOSIS — Z1231 Encounter for screening mammogram for malignant neoplasm of breast: Secondary | ICD-10-CM | POA: Diagnosis present

## 2021-03-29 ENCOUNTER — Other Ambulatory Visit: Payer: Self-pay | Admitting: Internal Medicine

## 2021-03-29 DIAGNOSIS — Z1231 Encounter for screening mammogram for malignant neoplasm of breast: Secondary | ICD-10-CM

## 2021-04-14 ENCOUNTER — Other Ambulatory Visit: Payer: Self-pay

## 2021-04-14 ENCOUNTER — Ambulatory Visit
Admission: RE | Admit: 2021-04-14 | Discharge: 2021-04-14 | Disposition: A | Discharge status: Home / Self Care | Payer: Medicare Other | Source: Ambulatory Visit | Attending: Internal Medicine | Admitting: Internal Medicine

## 2021-04-14 DIAGNOSIS — Z1231 Encounter for screening mammogram for malignant neoplasm of breast: Secondary | ICD-10-CM | POA: Insufficient documentation

## 2021-04-21 ENCOUNTER — Other Ambulatory Visit: Payer: Self-pay | Admitting: Internal Medicine

## 2021-04-21 DIAGNOSIS — R928 Other abnormal and inconclusive findings on diagnostic imaging of breast: Secondary | ICD-10-CM

## 2021-04-27 ENCOUNTER — Other Ambulatory Visit: Payer: Self-pay

## 2021-04-27 ENCOUNTER — Ambulatory Visit
Admission: RE | Admit: 2021-04-27 | Discharge: 2021-04-27 | Disposition: A | Discharge status: Home / Self Care | Payer: Medicare Other | Source: Ambulatory Visit | Attending: Internal Medicine | Admitting: Internal Medicine

## 2021-04-27 DIAGNOSIS — R928 Other abnormal and inconclusive findings on diagnostic imaging of breast: Secondary | ICD-10-CM | POA: Diagnosis not present

## 2021-04-29 ENCOUNTER — Other Ambulatory Visit: Payer: Self-pay | Admitting: Internal Medicine

## 2021-04-29 DIAGNOSIS — R928 Other abnormal and inconclusive findings on diagnostic imaging of breast: Secondary | ICD-10-CM

## 2021-05-06 ENCOUNTER — Other Ambulatory Visit: Payer: Self-pay

## 2021-05-06 ENCOUNTER — Ambulatory Visit
Admission: RE | Admit: 2021-05-06 | Discharge: 2021-05-06 | Disposition: A | Discharge status: Home / Self Care | Payer: Medicare Other | Source: Ambulatory Visit | Attending: Internal Medicine | Admitting: Internal Medicine

## 2021-05-06 DIAGNOSIS — R928 Other abnormal and inconclusive findings on diagnostic imaging of breast: Secondary | ICD-10-CM | POA: Diagnosis not present

## 2021-05-06 HISTORY — PX: BREAST BIOPSY: SHX20

## 2021-05-10 ENCOUNTER — Encounter: Payer: Self-pay | Admitting: Internal Medicine

## 2021-05-10 LAB — SURGICAL PATHOLOGY

## 2022-03-10 ENCOUNTER — Other Ambulatory Visit: Payer: Self-pay | Admitting: Internal Medicine

## 2022-03-10 DIAGNOSIS — Z1231 Encounter for screening mammogram for malignant neoplasm of breast: Secondary | ICD-10-CM

## 2022-04-25 ENCOUNTER — Ambulatory Visit
Admission: RE | Admit: 2022-04-25 | Discharge: 2022-04-25 | Disposition: A | Discharge status: Home / Self Care | Payer: Medicare Other | Source: Ambulatory Visit | Attending: Internal Medicine | Admitting: Internal Medicine

## 2022-04-25 DIAGNOSIS — Z1231 Encounter for screening mammogram for malignant neoplasm of breast: Secondary | ICD-10-CM | POA: Diagnosis not present

## 2022-10-07 ENCOUNTER — Other Ambulatory Visit
Admission: RE | Admit: 2022-10-07 | Discharge: 2022-10-07 | Disposition: A | Discharge status: Home / Self Care | Payer: Medicare Other | Source: Ambulatory Visit | Attending: Internal Medicine | Admitting: Internal Medicine

## 2022-10-07 DIAGNOSIS — E785 Hyperlipidemia, unspecified: Secondary | ICD-10-CM | POA: Diagnosis present

## 2022-10-07 DIAGNOSIS — I10 Essential (primary) hypertension: Secondary | ICD-10-CM | POA: Insufficient documentation

## 2022-10-07 DIAGNOSIS — R7303 Prediabetes: Secondary | ICD-10-CM | POA: Diagnosis present

## 2022-10-07 DIAGNOSIS — Z0000 Encounter for general adult medical examination without abnormal findings: Secondary | ICD-10-CM | POA: Insufficient documentation

## 2022-10-07 DIAGNOSIS — J4520 Mild intermittent asthma, uncomplicated: Secondary | ICD-10-CM | POA: Insufficient documentation

## 2022-10-07 LAB — D-DIMER, QUANTITATIVE: D-Dimer, Quant: 0.33 ug/mL-FEU (ref 0.00–0.50)

## 2023-04-03 ENCOUNTER — Other Ambulatory Visit: Payer: Self-pay | Admitting: Internal Medicine

## 2023-04-03 DIAGNOSIS — Z1231 Encounter for screening mammogram for malignant neoplasm of breast: Secondary | ICD-10-CM

## 2023-05-02 ENCOUNTER — Ambulatory Visit
Admission: RE | Admit: 2023-05-02 | Discharge: 2023-05-02 | Disposition: A | Discharge status: Home / Self Care | Payer: Medicare Other | Source: Ambulatory Visit | Attending: Internal Medicine | Admitting: Internal Medicine

## 2023-05-02 DIAGNOSIS — Z1231 Encounter for screening mammogram for malignant neoplasm of breast: Secondary | ICD-10-CM | POA: Diagnosis present

## 2023-08-08 IMAGING — MG MM DIGITAL SCREENING BILAT W/ TOMO AND CAD
8 series · 8 of 24 positions shown · non-contrast
Comparison: Previous exam(s).

CLINICAL DATA: Screening.

EXAM:
DIGITAL SCREENING BILATERAL MAMMOGRAM WITH TOMOSYNTHESIS AND CAD
TECHNIQUE: Bilateral screening digital craniocaudal and mediolateral oblique
mammograms were obtained. Bilateral screening digital breast
tomosynthesis was performed. The images were evaluated with
computer-aided detection.

[L MLO synth-2D]
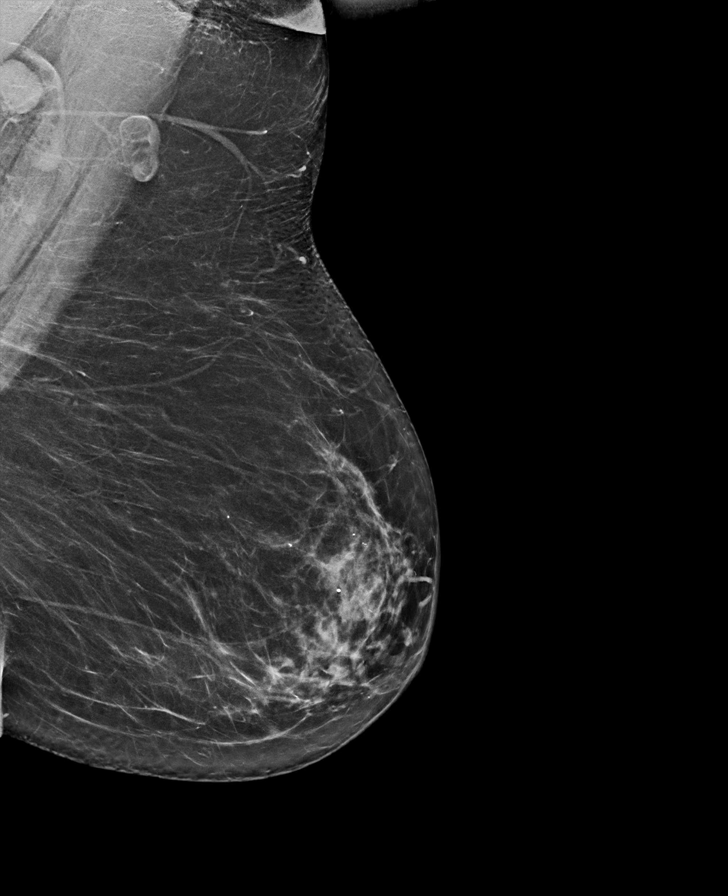

[L CC synth-2D]
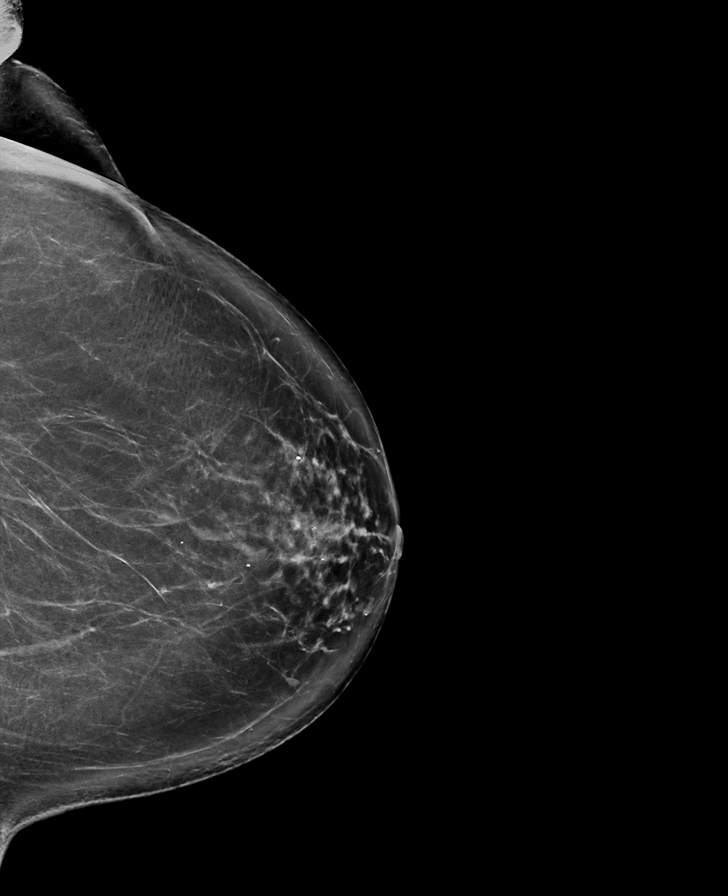

[R MLO synth-2D]
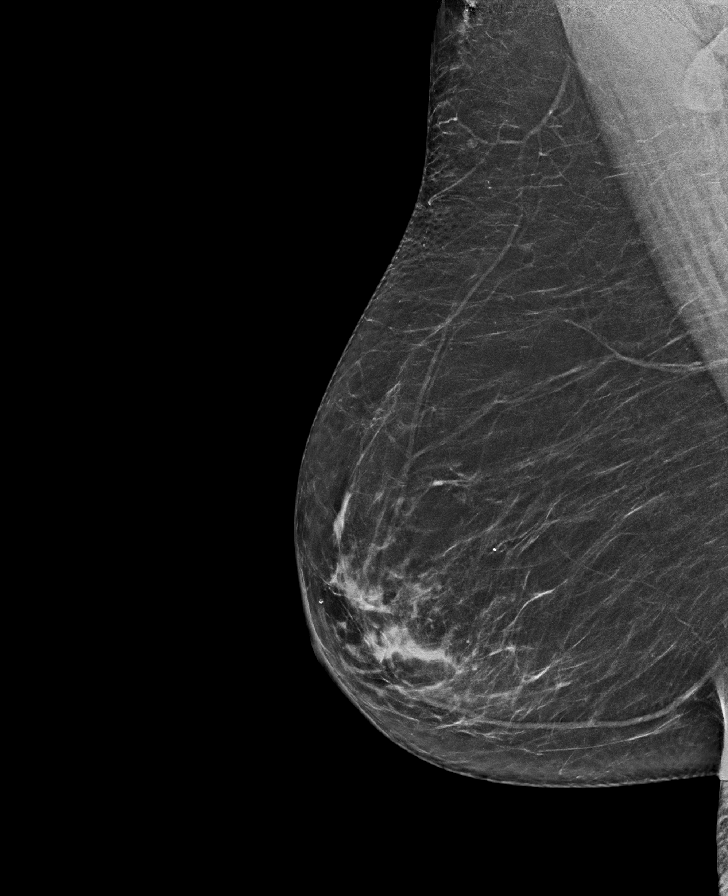

[R CC synth-2D]
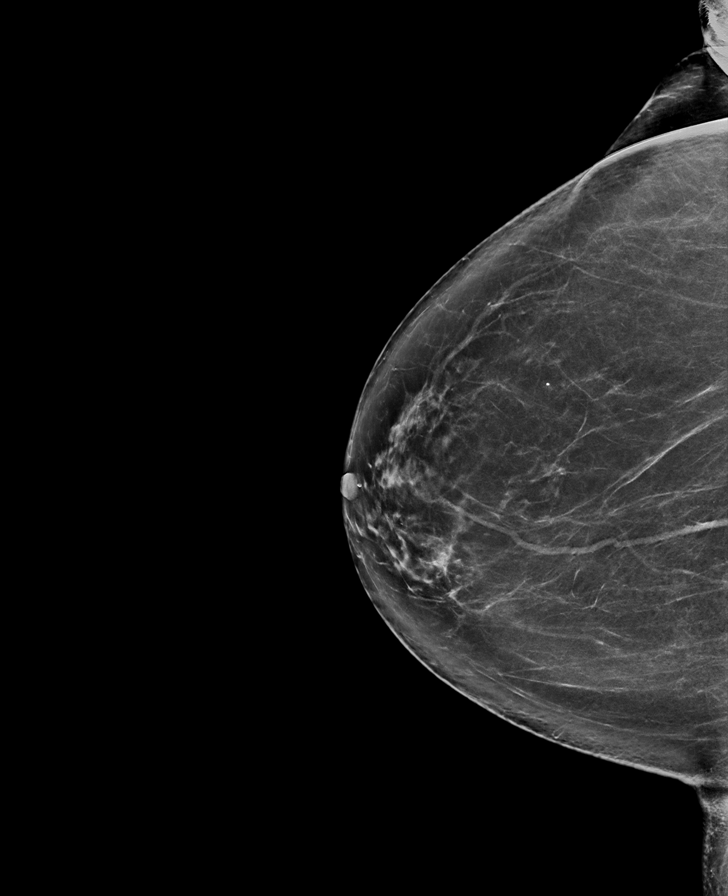

[L CC tomo · tomo slice 41/82.0]
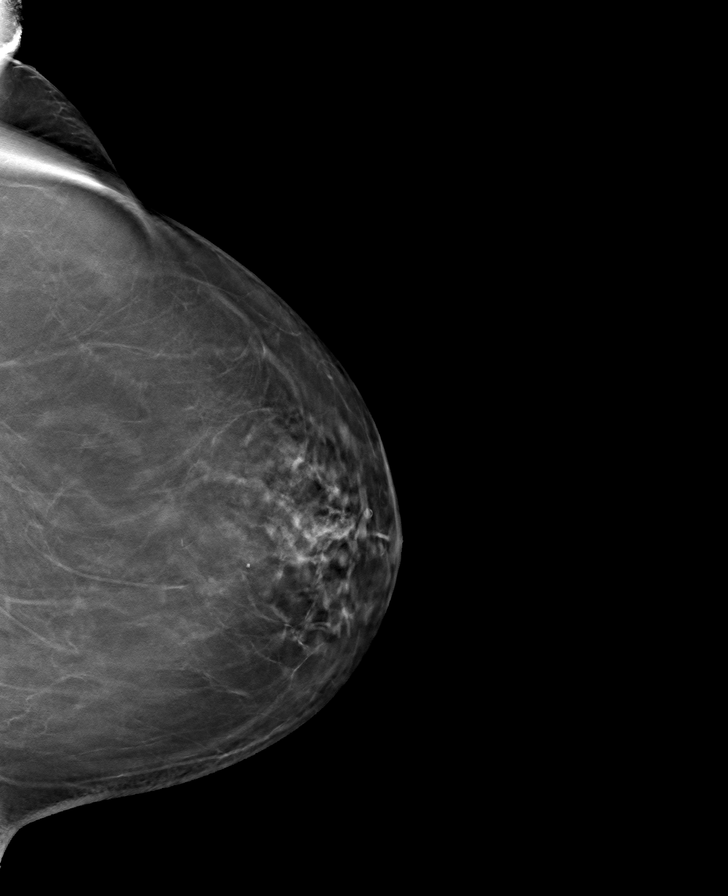

[L MLO tomo · tomo slice 39/77.0]
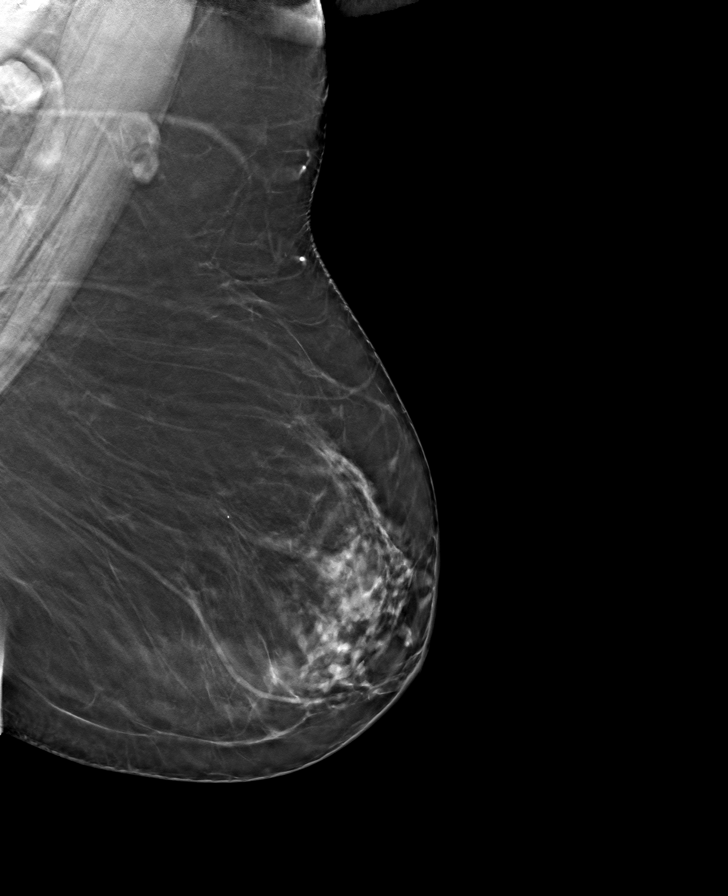

[R MLO tomo · tomo slice 37/72.0]
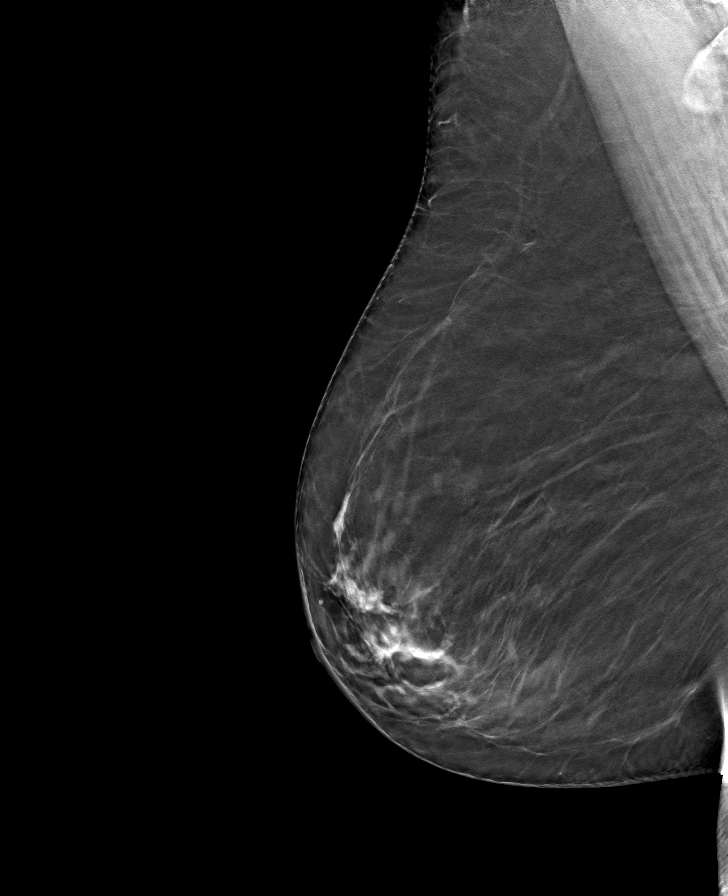

[R CC tomo · tomo slice 41/82.0]
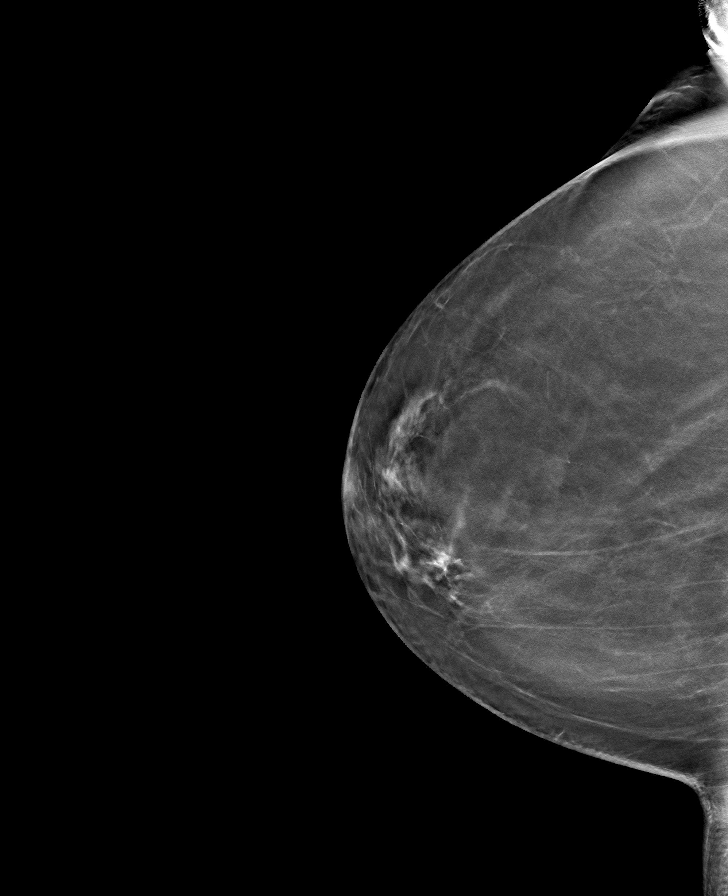

[8 of 24 positions shown; findings below may reference images not displayed]

ACR Breast Density Category b: There are scattered areas of
fibroglandular density.
FINDINGS: There are no findings suspicious for malignancy.
IMPRESSION: No mammographic evidence of malignancy. A result letter of this
screening mammogram will be mailed directly to the patient.

RECOMMENDATION:
Screening mammogram in one year. (Code:51-O-LD2)

BI-RADS CATEGORY  1: Negative.

## 2024-04-24 ENCOUNTER — Other Ambulatory Visit: Payer: Self-pay | Admitting: Internal Medicine

## 2024-04-24 DIAGNOSIS — Z1231 Encounter for screening mammogram for malignant neoplasm of breast: Secondary | ICD-10-CM

## 2024-04-27 ENCOUNTER — Emergency Department

## 2024-04-27 ENCOUNTER — Encounter: Payer: Self-pay | Admitting: Emergency Medicine

## 2024-04-27 ENCOUNTER — Inpatient Hospital Stay

## 2024-04-27 ENCOUNTER — Other Ambulatory Visit: Payer: Self-pay

## 2024-04-27 ENCOUNTER — Inpatient Hospital Stay
Admission: EM | Admit: 2024-04-27 | Discharge: 2024-05-03 | DRG: 062 | Disposition: A | Discharge status: Rehabilitation Facility | Attending: Family Medicine | Admitting: Family Medicine

## 2024-04-27 DIAGNOSIS — E119 Type 2 diabetes mellitus without complications: Secondary | ICD-10-CM | POA: Diagnosis present

## 2024-04-27 DIAGNOSIS — Z882 Allergy status to sulfonamides status: Secondary | ICD-10-CM | POA: Diagnosis not present

## 2024-04-27 DIAGNOSIS — I639 Cerebral infarction, unspecified: Secondary | ICD-10-CM

## 2024-04-27 DIAGNOSIS — Z751 Person awaiting admission to adequate facility elsewhere: Secondary | ICD-10-CM | POA: Diagnosis not present

## 2024-04-27 DIAGNOSIS — Z66 Do not resuscitate: Secondary | ICD-10-CM | POA: Diagnosis not present

## 2024-04-27 DIAGNOSIS — F32A Depression, unspecified: Secondary | ICD-10-CM | POA: Diagnosis present

## 2024-04-27 DIAGNOSIS — Z7902 Long term (current) use of antithrombotics/antiplatelets: Secondary | ICD-10-CM | POA: Diagnosis not present

## 2024-04-27 DIAGNOSIS — Z79899 Other long term (current) drug therapy: Secondary | ICD-10-CM | POA: Diagnosis not present

## 2024-04-27 DIAGNOSIS — I6389 Other cerebral infarction: Secondary | ICD-10-CM | POA: Diagnosis not present

## 2024-04-27 DIAGNOSIS — R29810 Facial weakness: Secondary | ICD-10-CM | POA: Diagnosis present

## 2024-04-27 DIAGNOSIS — Z794 Long term (current) use of insulin: Secondary | ICD-10-CM | POA: Diagnosis not present

## 2024-04-27 DIAGNOSIS — R471 Dysarthria and anarthria: Secondary | ICD-10-CM | POA: Diagnosis present

## 2024-04-27 DIAGNOSIS — G8194 Hemiplegia, unspecified affecting left nondominant side: Secondary | ICD-10-CM | POA: Diagnosis present

## 2024-04-27 DIAGNOSIS — Z91040 Latex allergy status: Secondary | ICD-10-CM | POA: Diagnosis not present

## 2024-04-27 DIAGNOSIS — Z7982 Long term (current) use of aspirin: Secondary | ICD-10-CM

## 2024-04-27 DIAGNOSIS — G2581 Restless legs syndrome: Secondary | ICD-10-CM | POA: Diagnosis present

## 2024-04-27 DIAGNOSIS — K5900 Constipation, unspecified: Secondary | ICD-10-CM | POA: Diagnosis present

## 2024-04-27 DIAGNOSIS — I10 Essential (primary) hypertension: Secondary | ICD-10-CM | POA: Diagnosis present

## 2024-04-27 DIAGNOSIS — R29706 NIHSS score 6: Secondary | ICD-10-CM | POA: Diagnosis present

## 2024-04-27 DIAGNOSIS — K219 Gastro-esophageal reflux disease without esophagitis: Secondary | ICD-10-CM | POA: Diagnosis present

## 2024-04-27 DIAGNOSIS — N179 Acute kidney failure, unspecified: Secondary | ICD-10-CM | POA: Diagnosis present

## 2024-04-27 DIAGNOSIS — Z888 Allergy status to other drugs, medicaments and biological substances status: Secondary | ICD-10-CM

## 2024-04-27 DIAGNOSIS — I6381 Other cerebral infarction due to occlusion or stenosis of small artery: Secondary | ICD-10-CM | POA: Diagnosis present

## 2024-04-27 DIAGNOSIS — R531 Weakness: Secondary | ICD-10-CM | POA: Diagnosis not present

## 2024-04-27 DIAGNOSIS — G510 Bell's palsy: Secondary | ICD-10-CM | POA: Diagnosis present

## 2024-04-27 DIAGNOSIS — J45909 Unspecified asthma, uncomplicated: Secondary | ICD-10-CM | POA: Diagnosis present

## 2024-04-27 DIAGNOSIS — E785 Hyperlipidemia, unspecified: Secondary | ICD-10-CM | POA: Diagnosis present

## 2024-04-27 DIAGNOSIS — K5901 Slow transit constipation: Secondary | ICD-10-CM | POA: Diagnosis not present

## 2024-04-27 DIAGNOSIS — R29898 Other symptoms and signs involving the musculoskeletal system: Secondary | ICD-10-CM

## 2024-04-27 DIAGNOSIS — R739 Hyperglycemia, unspecified: Secondary | ICD-10-CM | POA: Diagnosis not present

## 2024-04-27 DIAGNOSIS — Z880 Allergy status to penicillin: Secondary | ICD-10-CM | POA: Diagnosis not present

## 2024-04-27 DIAGNOSIS — R7303 Prediabetes: Secondary | ICD-10-CM | POA: Diagnosis not present

## 2024-04-27 DIAGNOSIS — Z789 Other specified health status: Secondary | ICD-10-CM | POA: Diagnosis not present

## 2024-04-27 DIAGNOSIS — R55 Syncope and collapse: Secondary | ICD-10-CM | POA: Diagnosis not present

## 2024-04-27 HISTORY — DX: Cerebral infarction, unspecified: I63.9

## 2024-04-27 LAB — CBC
HCT: 41.6 % (ref 36.0–46.0)
Hemoglobin: 14.3 g/dL (ref 12.0–15.0)
MCH: 31.5 pg (ref 26.0–34.0)
MCHC: 34.4 g/dL (ref 30.0–36.0)
MCV: 91.6 fL (ref 80.0–100.0)
Platelets: 289 10*3/uL (ref 150–400)
RBC: 4.54 MIL/uL (ref 3.87–5.11)
RDW: 13 % (ref 11.5–15.5)
WBC: 7.9 10*3/uL (ref 4.0–10.5)
nRBC: 0 % (ref 0.0–0.2)

## 2024-04-27 LAB — COMPREHENSIVE METABOLIC PANEL WITH GFR
ALT: 19 U/L (ref 0–44)
AST: 26 U/L (ref 15–41)
Albumin: 4.1 g/dL (ref 3.5–5.0)
Alkaline Phosphatase: 77 U/L (ref 38–126)
Anion gap: 10 (ref 5–15)
BUN: 28 mg/dL — ABNORMAL HIGH (ref 8–23)
CO2: 26 mmol/L (ref 22–32)
Calcium: 10.4 mg/dL — ABNORMAL HIGH (ref 8.9–10.3)
Chloride: 100 mmol/L (ref 98–111)
Creatinine, Ser: 1.61 mg/dL — ABNORMAL HIGH (ref 0.44–1.00)
GFR, Estimated: 32 mL/min — ABNORMAL LOW (ref >60)
Glucose, Bld: 145 mg/dL — ABNORMAL HIGH (ref 70–99)
Potassium: 3.8 mmol/L (ref 3.5–5.1)
Sodium: 136 mmol/L (ref 135–145)
Total Bilirubin: 0.7 mg/dL (ref 0.0–1.2)
Total Protein: 7.8 g/dL (ref 6.5–8.1)

## 2024-04-27 LAB — DIFFERENTIAL
Abs Immature Granulocytes: 0.02 10*3/uL (ref 0.00–0.07)
Basophils Absolute: 0.1 10*3/uL (ref 0.0–0.1)
Basophils Relative: 1 %
Eosinophils Absolute: 0.1 10*3/uL (ref 0.0–0.5)
Eosinophils Relative: 2 %
Immature Granulocytes: 0 %
Lymphocytes Relative: 35 %
Lymphs Abs: 2.8 10*3/uL (ref 0.7–4.0)
Monocytes Absolute: 0.5 10*3/uL (ref 0.1–1.0)
Monocytes Relative: 7 %
Neutro Abs: 4.5 10*3/uL (ref 1.7–7.7)
Neutrophils Relative %: 55 %

## 2024-04-27 LAB — ETHANOL: Alcohol, Ethyl (B): <15 mg/dL (ref <15)

## 2024-04-27 LAB — APTT: aPTT: 26 s (ref 24–36)

## 2024-04-27 LAB — CBG MONITORING, ED: Glucose-Capillary: 132 mg/dL — ABNORMAL HIGH (ref 70–99)

## 2024-04-27 LAB — PROTIME-INR
INR: 1 (ref 0.8–1.2)
Prothrombin Time: 13.5 s (ref 11.4–15.2)

## 2024-04-27 MED ORDER — TENECTEPLASE FOR STROKE: 0.2500 mg/kg | PACK | Freq: Once | INTRAVENOUS | Status: DC

## 2024-04-27 MED ORDER — TENECTEPLASE FOR STROKE: 0.2500 mg/kg | PACK | Freq: Once | INTRAVENOUS | Status: AC

## 2024-04-27 MED ORDER — SODIUM CHLORIDE 0.9% FLUSH: 3.0000 mL | Freq: Once | INTRAVENOUS | Status: DC

## 2024-04-27 MED ORDER — SODIUM CHLORIDE 0.9% FLUSH
3.0000 mL | Freq: Two times a day (BID) | INTRAVENOUS | Status: DC
  Administered 2024-04-28 (×2): 3 mL via INTRAVENOUS
  Administered 2024-04-29 – 2024-05-02 (×8): 10 mL via INTRAVENOUS
  Administered 2024-05-03: 5 mL via INTRAVENOUS

## 2024-04-27 MED ORDER — STROKE: EARLY STAGES OF RECOVERY BOOK: Freq: Once | Status: AC

## 2024-04-27 MED ORDER — SODIUM CHLORIDE 0.9% FLUSH: 3.0000 mL | INTRAVENOUS | Status: DC | PRN

## 2024-04-27 MED ORDER — ACETAMINOPHEN 650 MG RE SUPP: 650.0000 mg | RECTAL | Status: DC | PRN

## 2024-04-27 MED ORDER — IOHEXOL 350 MG/ML SOLN
100.0000 mL | Freq: Once | INTRAVENOUS | Status: AC | PRN
  Administered 2024-04-27: 100 mL via INTRAVENOUS

## 2024-04-27 MED ORDER — ACETAMINOPHEN 325 MG PO TABS
650.0000 mg | ORAL_TABLET | ORAL | Status: AC | PRN
Start: 2024-04-27 — End: ?
  Administered 2024-04-28 – 2024-05-01 (×2): 650 mg via ORAL
  Filled 2024-04-27 (×2): qty 2

## 2024-04-27 MED ORDER — ACETAMINOPHEN 160 MG/5ML PO SOLN: 650.0000 mg | ORAL | Status: DC | PRN

## 2024-04-27 MED ORDER — PANTOPRAZOLE SODIUM 40 MG IV SOLR
40.0000 mg | Freq: Every day | INTRAVENOUS | Status: DC
Start: 2024-04-27 — End: 2024-04-29
  Administered 2024-04-28: 40 mg via INTRAVENOUS
  Filled 2024-04-27: qty 10

## 2024-04-27 MED ORDER — SENNOSIDES-DOCUSATE SODIUM 8.6-50 MG PO TABS
1.0000 | ORAL_TABLET | Freq: Every evening | ORAL | Status: AC | PRN
Start: 2024-04-27 — End: ?
  Administered 2024-04-30 – 2024-05-01 (×2): 1 via ORAL
  Filled 2024-04-27 (×2): qty 1

## 2024-04-27 MED ORDER — TENECTEPLASE FOR STROKE
PACK | INTRAVENOUS | Status: AC
  Administered 2024-04-27: 20 mg via INTRAVENOUS
  Filled 2024-04-27: qty 10

## 2024-04-27 NOTE — H&P (Signed)
 NAME:  Anita Reilly, MRN:  [id number], DOB:  Oct 27, 1946, LOS: 0 ADMISSION DATE:  04/27/2024, CONSULTATION DATE:  04/27/24 REFERRING MD: Iven Mark CHIEF COMPLAINT:  stroke like symptoms    HPI  HPI: Anita Reilly is an 78 y.o. female with PMHx of right face Bell's Palsy, HTN, HLD, RLS, GERD, Asthma and Depression who presented to the ED as a code stroke.  Per EMS runsheet, patient was at a wedding when she c/o feeling very hot and went to sit down. A bystander noticed she was almost unresponsive and called 911. When EMS arrive, pt was sitting upright on the curb alongside FD, MED1, and wedding attendees. Pt was a & ox4 with clammy skin however bystanders advised they had poured water on her in attempts to alleviate her symptom(s).  Initial stroke screen performed noted total loss of motor function in her left leg as well as left facial droop with an LVO score of 2 and a " last known well" of 20:15. Pt denied taking blood thinners or known cardiac hx. Pt was able to stand and pivot to lay on stretcher where she was secured with straps and rails. pt' s left leg deficit had resolved and she had regained normal motor function however she still had residual left- sided facial droop throughout transport. Pt does have reported hx of Bell' s Palsy affecting the right- side of her face. " Code Stroke" activated en route to the ED.   ED Course: Initial vital signs showed HR of 77 beats/minute, BP 123/80 mm Hg, the RR 18 breaths/minute, and the oxygen saturation 94% on RA  and a temperature of 98.36F (36.0C). CODE stroke was initiated and patient evaluated by a teleneurologist. A stat CTH and CTA head obtained did not reveal any large vessel occlusion and for that reason the patient was not considered a thrombectomy candidate  however she deemed her a candidate for TNK.  Pertinent Labs/Diagnostics Findings: Glucose:145 BUN/Cr.:28/1.61 WBC: unremarkable Date last known well: 04/27/24 Time last  known well: Time: 21:15 Initial NIHSS:6 TNK Given: Yes @22 :37  Past Medical History:  Diagnosis Date   Arthritis    shoulders   Bell's palsy 2016   Vertigo    none in several years    Past Surgical History:  Procedure Laterality Date   BREAST BIOPSY Left 05/06/2021   u/s bx, hydromark, path pending   CARPAL TUNNEL RELEASE     left    CATARACT EXTRACTION W/PHACO Right 09/19/2018   Procedure: CATARACT EXTRACTION PHACO AND INTRAOCULAR LENS PLACEMENT (IOC) RIGHT;  Surgeon: Annell Kidney, MD;  Location: [facility];  Service: Ophthalmology;  Laterality: Right;  latex sensitivity   CATARACT EXTRACTION W/PHACO Left 10/10/2018   Procedure: CATARACT EXTRACTION PHACO AND INTRAOCULAR LENS PLACEMENT (IOC)  LEFT;  Surgeon: Annell Kidney, MD;  Location: [facility];  Service: Ophthalmology;  Laterality: Left;  Latex sensitivity requests arrival around 10 or 10:30   COLONOSCOPY      Family History  Problem Relation Age of Onset   Breast cancer Neg Hx    Social History:  reports that she has never smoked. She has never used smokeless tobacco. She reports that she does not drink alcohol and does not use drugs.  Allergies:  Allergies  Allergen Reactions   Latex     Dental gloves caused lip swelling   Sulfa Antibiotics Swelling    lips   Tylox [Oxycodone-Acetaminophen ]    Penicillins Rash    Veetids caused rash  Medications: I have reviewed the patient's current medications. Prior to Admission:  Medications Prior to Admission  Medication Sig Dispense Refill Last Dose/Taking   desipramine (NOPRAMIN) 10 MG tablet Take 10 mg by mouth daily.   Taking   losartan (COZAAR) 50 MG tablet Take 50 mg by mouth daily.   Taking   Calcium Carbonate-Vitamin D (CALTRATE 600+D PO) Take 1 tablet by mouth daily.      pregabalin (LYRICA) 25 MG capsule Take 1 capsule by mouth 2 (two) times daily.      Scheduled:   stroke: early stages of recovery book   Does not apply  Once   pantoprazole (PROTONIX) IV  40 mg Intravenous QHS   sodium chloride flush  3 mL Intravenous Once   sodium chloride flush  3-10 mL Intravenous Q12H   Continuous: PRN:acetaminophen  **OR** acetaminophen  (TYLENOL ) oral liquid 160 mg/5 mL **OR** acetaminophen , senna-docusate, sodium chloride flush (Not in a hospital admission)  Scheduled Meds:  [START ON 04/28/2024]  stroke: early stages of recovery book   Does not apply Once   pantoprazole (PROTONIX) IV  40 mg Intravenous QHS   sodium chloride flush  3 mL Intravenous Once   sodium chloride flush  3-10 mL Intravenous Q12H   Continuous Infusions: PRN Meds:.acetaminophen  **OR** acetaminophen  (TYLENOL ) oral liquid 160 mg/5 mL **OR** acetaminophen , senna-docusate, sodium chloride flush  ROS:  PHYSICAL EXAMINATION: Blood pressure (!) 122/105, pulse 72, temperature 97.7 F (36.5 C), resp. rate 16, height 5\' 4"  (1.626 m), weight 79.6 kg, SpO2 100%.   HEENT-  Normocephalic, no lesions, without obvious abnormality.  Normal external eye and conjunctiva.  Normal TM's bilaterally.  Normal auditory canals and external ears. Normal external nose, mucus membranes and septum.  Normal pharynx. CARDIOVASCULAR- S1, S2 normal, pulses palpable throughout   LUNGS- chest clear, no wheezing, rales, normal symmetric air entry ABDOMEN- soft, non-tender; bowel sounds normal; no masses,  no organomegaly EXTREMITIES- no edema LYMPH-NO ADENOPATHY PALPABLE MUSCULOSKELETAL-no joint tenderness, deformity or swelling SKIN-warm and dry, no hyperpigmentation, vitiligo, or suspicious lesions  NEUROLOGICAL EXAM   Mental Status: Alert, oriented, thought content appropriate.  Speech fluent without evidence of aphasia.  Able to follow 3 step commands without difficulty. Attention span and concentration seemed appropriate  Cranial Nerves: II: Discs flat bilaterally; Visual fields grossly normal, pupils equal, round, reactive to light and accommodation III,IV, VI:  ptosis not present, extra-ocular motions intact bilaterally V,VII: Facial asymmetric L>R, facial light touch sensation decreased VIII: hearing normal bilaterally IX,X: gag reflex present XI: bilateral shoulder shrug XII: midline tongue extension Motor: Left sided drift and weakness Tone and bulk:normal tone throughout; no atrophy noted Sensory: Decreased sensation on the left. Deep Tendon Reflexes: 2+ and symmetric throughout Plantars: Right: mute                              Left: mute Cerebellar: Finger-to-nose testing intact bilaterally. Heel to shin testing normal bilaterally Gait: not tested due to safety concerns  Data Reviewed  Laboratory Studies:  Basic Metabolic Panel: Recent Labs  Lab 04/27/24 2140  NA 136  K 3.8  CL 100  CO2 26  GLUCOSE 145*  BUN 28*  CREATININE 1.61*  CALCIUM 10.4*    Liver Function Tests: Recent Labs  Lab 04/27/24 2140  AST 26  ALT 19  ALKPHOS 77  BILITOT 0.7  PROT 7.8  ALBUMIN 4.1   No results for input(s): "LIPASE", "AMYLASE" in the last 168 hours. No  results for input(s): "AMMONIA" in the last 168 hours.  CBC: Recent Labs  Lab 04/27/24 2140  WBC 7.9  NEUTROABS 4.5  HGB 14.3  HCT 41.6  MCV 91.6  PLT 289    Cardiac Enzymes: No results for input(s): "CKTOTAL", "CKMB", "CKMBINDEX", "TROPONINI" in the last 168 hours.  BNP: Invalid input(s): "POCBNP"  CBG: Recent Labs  Lab 04/27/24 2140  GLUCAP 132*    Microbiology: No results found for this or any previous visit.  Coagulation Studies: Recent Labs    04/27/24 2140  LABPROT 13.5  INR 1.0    Urinalysis: No results for input(s): "COLORURINE", "LABSPEC", "PHURINE", "GLUCOSEU", "HGBUR", "BILIRUBINUR", "KETONESUR", "PROTEINUR", "UROBILINOGEN", "NITRITE", "LEUKOCYTESUR" in the last 168 hours.  Invalid input(s): "APPERANCEUR"  Lipid Panel: No results found for: "CHOL", "TRIG", "HDL", "CHOLHDL", "VLDL", "LDLCALC"  HgbA1C: No results found for:  "HGBA1C"  Urine Drug Screen:  No results found for: "LABOPIA", "COCAINSCRNUR", "LABBENZ", "AMPHETMU", "THCU", "LABBARB"  Alcohol Level:  Recent Labs  Lab 04/27/24 2140  ETH <15    Other results: EKG: normal EKG, normal sinus rhythm, unchanged from previous tracings.  IMAGING: CT ANGIO HEAD NECK W WO CM W PERF (CODE STROKE) Result Date: 04/27/2024 CLINICAL DATA:  Acute neurologic deficit EXAM: CT ANGIOGRAPHY HEAD AND NECK CT PERFUSION BRAIN TECHNIQUE: Multidetector CT imaging of the head and neck was performed using the standard protocol during bolus administration of intravenous contrast. Multiplanar CT image reconstructions and MIPs were obtained to evaluate the vascular anatomy. Carotid stenosis measurements (when applicable) are obtained utilizing NASCET criteria, using the distal internal carotid diameter as the denominator. Multiphase CT imaging of the brain was performed following IV bolus contrast injection. Subsequent parametric perfusion maps were calculated using RAPID software. RADIATION DOSE REDUCTION: This exam was performed according to the departmental dose-optimization program which includes automated exposure control, adjustment of the mA and/or kV according to patient size and/or use of iterative reconstruction technique. CONTRAST:  OMNIPAQUE IOHEXOL 350 MG/ML SOLN COMPARISON:  Head CT 04/27/2024 FINDINGS: CTA NECK FINDINGS Skeleton: No acute abnormality or high grade bony spinal canal stenosis. Other neck: Normal pharynx, larynx and major salivary glands. No cervical lymphadenopathy. Unremarkable thyroid gland. Upper chest: No pneumothorax or pleural effusion. No nodules or masses. Aortic arch: There is calcific atherosclerosis of the aortic arch. Conventional 3 vessel aortic branching pattern. RIGHT carotid system: Normal without aneurysm, dissection or stenosis. LEFT carotid system: Normal without aneurysm, dissection or stenosis. Vertebral arteries: Codominant configuration.  There is no dissection, occlusion or flow-limiting stenosis to the skull base (V1-V3 segments). CTA HEAD FINDINGS POSTERIOR CIRCULATION: Vertebral arteries are normal. No proximal occlusion of the anterior or inferior cerebellar arteries. Basilar artery is normal. Superior cerebellar arteries are normal. Posterior cerebral arteries are normal. ANTERIOR CIRCULATION: Intracranial internal carotid arteries are normal. Anterior cerebral arteries are normal. Middle cerebral arteries are normal. Venous sinuses: As permitted by contrast timing, patent. Anatomic variants: None Review of the MIP images confirms the above findings. CT Brain Perfusion Findings: ASPECTS: 10 CBF (<30%) Volume: 0mL Perfusion (Tmax>6.0s) volume: 0mL Mismatch Volume: 0mL Infarction Location:None IMPRESSION: 1. No emergent large vessel occlusion or high-grade stenosis of the intracranial arteries. 2. Normal CT perfusion. Aortic Atherosclerosis (ICD10-I70.0). Electronically Signed   By: Juanetta Nordmann M.D.   On: 04/27/2024 22:29   CT HEAD CODE STROKE WO CONTRAST Result Date: 04/27/2024 CLINICAL DATA:  Code stroke. Neuro deficit, concern for stroke, left-sided facial weakness, slurred speech. EXAM: CT HEAD WITHOUT CONTRAST TECHNIQUE: Contiguous axial images were obtained from  the base of the skull through the vertex without intravenous contrast. RADIATION DOSE REDUCTION: This exam was performed according to the departmental dose-optimization program which includes automated exposure control, adjustment of the mA and/or kV according to patient size and/or use of iterative reconstruction technique. COMPARISON:  MRI head 08/16/2012. FINDINGS: Brain: No acute intracranial hemorrhage. No CT evidence of acute infarct. No edema, mass effect, or midline shift. The basilar cisterns are patent. Ventricles: The ventricles are normal. Vascular: Atherosclerotic calcifications of the carotid siphons and intracranial vertebral arteries. No hyperdense vessel. Skull:  No acute or aggressive finding. Orbits: Bilateral lens replacement. Sinuses: The visualized paranasal sinuses are clear. Other: Mastoid air cells are clear. ASPECTS [facility] Stroke Program Early CT Score) - Ganglionic level infarction (caudate, lentiform nuclei, internal capsule, insula, M1-M3 cortex): 7 - Supraganglionic infarction (M4-M6 cortex): 3 Total score (0-10 with 10 being normal): 10 IMPRESSION: 1. No CT evidence of acute intracranial abnormality. 2. ASPECTS is 10 These results were called by telephone at the time of interpretation on 04/27/2024 at 9:55 pm to provider [facility] , who verbally acknowledged these results. Electronically Signed   By: Denny Flack M.D.   On: 04/27/2024 21:55   ACTIVE HOSPITAL PROBLEM LIST   See systems below  ASSESSMENT & PLAN:   #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 ~presenting w/near syncope, left facial droop and left sided weakness Hx of Right Facial Paralysis (Bells'Palsy) -Vital signs/blood pressure with neuro checks/NIHSS  per TNK protocol for the first 24 hours  -No lines, catheters, antiplatelet or anticoagulants x24 hr after IV TNK unless required  -keep BP <185/100 with short-acting antihypertensives (Labetolol, Hydralazine or Nicardipine Gtt as needed).  -MRI brain without contrast per stroke protocol -TTE to r/o cardioembolic source, assess EF  -check fasting Lipid Panel with Direct LDL in AM.  -check HgA1c, TSH  -Will repeat head CT in 24 hours s/p IV TNK per protocol. -Place SCDs for DVT prevention. -NPO for now until passes swallow screen  -PT consult, OT consult, Speech consult -Aspiration precautions, Bleeding precautions -Obtain STAT head CT for any new acute headache or new neurological deficits -Neurology consult  #Hx of Hypertension #HLD -BP control as above -check Lipid panel  #Hx of Post herpetic Neuralgia #Right Facial Paralysis -Continue Lyrica    BEST PRACTICE:  Diet:  NPO Pain/Anxiety/Delirium  protocol (if indicated): No VAP protocol (if indicated): Not indicated DVT prophylaxis: Contraindicated GI prophylaxis: PPI Glucose control:  SSI No Central venous access:  N/A Arterial line:  N/A Foley:  N/A Mobility:  bed rest  PT consulted: Yes Last date of multidisciplinary goals of care discussion [updated at the bedside] Code Status:  full code Disposition: ICU     Alonza Arthurs DNP, CCRN, FNP-C, AGACNP-BC Acute Care & Family Nurse Practitioner [facility] PCCM on call pager [phone]

## 2024-04-27 NOTE — ED Notes (Signed)
 Pt's son and daughter at bedside, tele-stroke MD speaking with patient's family at bedside at this time.

## 2024-04-27 NOTE — ED Notes (Signed)
 Pt returned from CT perfusion with primary RN, this RN, Pattie Borders, Tele-stroke RN at bedside with patient, patient with noted intermittent waxing and waning of symptoms, worsening of L facial droop and slurred speech that will resolve then worsen again.

## 2024-04-27 NOTE — ED Triage Notes (Signed)
 Pt to ED via [facility] with c/o Code Stroke. Per EMS pt was at a wedding  when she c/o feeling hot and needing to sit down, per EMS family reported pt having a possible syncopal episode at the wedding, however patient does not remember. Per EMS upon arrival L leg was flaccid and patient had L facial froop (pt has hx of R bell's palsy), per EMS en route L leg flacid/numbness has resvolved however reports ongoing L facial droop and some slurred speech.

## 2024-04-27 NOTE — ED Notes (Signed)
 High fall bundle in place by this RN.

## 2024-04-27 NOTE — Consult Note (Signed)
 [facility] Consult Services   Patient Name:   Marque, Bango Date of Birth:   [birth year] Identification Number:   MRN - [id number] Date of Service:   04/27/2024 21:47:56  Diagnosis:       R55 - Syncope (blackout, fainting, vasovagal attack)       I63.89 - Cerebrovascular accident (CVA) due to other mechanism [facility])  Impression:      Stroke    Patient is still within the window for treatment with IV thrombolytic therapy    Discussed suspected stroke diagnosis and potential emergent stroke therapies. Reviewed indications and benefits of IV Tenceteplase and potential side effects and adverse effects including bleeding, life-threatening bleeding and ICH, angioedema/allergy: and requirement for ICU and increased neurological monitoring post administration. Patient verbalizes understanding of the risks and benefits.    Patient further: Denies history of intracranial bleeding (ICH, SAH) or mass, coagulopathy or bleeding disorders, GI/GU bleeding within 21 days: GI malignance, stroke/trauma/head injury within 3 months, intracranial/intraspinal surgery within 3 months, OAC/NOAC within 48 hours, infective endocarditis symptoms, aortic arch dissection symptoms. CT negative for ICH, severe hypoattenuation, and intracranial neoplasm.      Plan    Administer IV tenecteplase    Post tenecteplase orders/protocol    MRI of the brain without contrast    CTA head and neck did not reveal any large vessel occlusion and for this reason the patient is not considered a thrombectomy candidate          Our recommendations are outlined below. Recommendations: IV Tenecteplase recommended.  I confirmed the following. (Patient name, DOB, MRN, Blood Pressure, dose of Thrombolytic and waste, weight completed by stretcher/scale not stated weight, have ED staff inform ED MD of thrombolytic decision) Thrombolytic bolus given Without Complication.   IV Tenecteplase Total  Dose - 19.9 mg (Dose Rounding Per Facility Protocol)   Routine post Thrombolytic monitoring including neuro checks and blood pressure control during/after treatment Monitor blood pressure Check blood pressure and neuro assessment every 15 min for 2 h, then every 30 min for 6 h, and finally every hour for 16 h.  Manage Blood Pressure per post Thrombolytic protocol.        Follow designated hospital protocol for admission and post thrombolytic care       CT brain 24 hours post Thrombolytic       NPO until swallowing screen performed and passed       No antiplatelet agents or anticoagulants (including heparin for DVT prophylaxis) in first 24 hours       No Foley catheter, nasogastric tube, arterial catheter or central venous catheter for 24 hr, unless absolutely necessary       Telemetry       Bedside swallow evaluation       HOB less than 30 degrees       Euglycemia       Avoid hyperthermia, PRN acetaminophen        DVT prophylaxis       Inpatient Neurology Consultation       Stroke evaluation as per inpatient neurology recommendations  Discussed with ED physician    ------------------------------------------------------------------------------  Advanced Imaging: CTA Head and Neck Completed.  CTP Completed.  LVO:No  Patient is not a candidate for NIR   Metrics: Last Known Well: 04/27/2024 20:15:00 Dispatch Time: 04/27/2024 21:47:56 Arrival Time: 04/27/2024 21:38:00 Initial Response Time: 04/27/2024 21:50:00 Symptoms: Syncopal episode and slurred speech . Initial patient interaction: 04/27/2024 21:53:00 NIHSS Assessment Completed: 04/27/2024 21:56:00 Patient is a candidate for  Thrombolytic. Thrombolytic Medical Decision: 04/27/2024 22:28:30 Needle Time: 04/27/2024 22:37:45 Weight Noted by Staff: 79.6 kg  CT Head: I personally reviewed all the CT images that were available to me and it showed: no acute abnormality  Primary Provider Notified of Diagnostic Impression and  Management Plan on: 04/27/2024 22:19:30    ------------------------------------------------------------------------------  Extended thrombolytic management related to:     Awaiting family arrival before decision as per patient's request     More extensive discussion on the management decision was requested by patient or family  Thrombolytic Contraindications:  Last Known Well > 4.5 hours: No CT Head showing hemorrhage: No Ischemic stroke within 3 months: No Severe head trauma within 3 months: No Intracranial/intraspinal surgery within 3 months: No History of intracranial hemorrhage: No Symptoms and signs consistent with an SAH: No GI malignancy or GI bleed within 21 days: No Coagulopathy: Platelets <100 000 /mm3, INR >1.7, aPTT>40 s, or PT >15 s: No Treatment dose of LMWH within the previous 24 hrs: No Use of NOACs in past 48 hours: No Glycoprotein IIb/IIIa receptor inhibitors use: No Symptoms consistent with infective endocarditis: No Suspected aortic arch dissection: No Intra-axial intracranial neoplasm: No  Thrombolytic Decision and Management Plan: Management with thrombolytic treatment was explained to the Patient and Family as was risks and benefits and alternatives to the treatment. Patient agrees with the decision to proceed with thrombolytic treatment. . All questions were answered and the Patient and Family expressed understanding of the treatment plan.   History of Present Illness: Patient is a 78 year old Female.  Patient was brought by EMS for symptoms of Syncopal episode and slurred speech . 78 year old female with no sig past medical history other than prior R sided Bell's palsy Patient was at a Wedding, when she reportedly had a syncopal episode On arrival , EMS Noted her L side was weak and speech was slurred On arrival to the ED her L sided weakness resolved, and when I evaluated her following her CTH - she had only minor L sided facial droop with loss Of  nasolabial fold  Additional stroke scale from the was only a 1  However after evaluating the patient and not feeling that the patient would be an IV thrombolytic candidate due to very mild nondisabling symptoms as well as a likely alternative diagnosis that being a syncopal event I felt the patient should still proceed with a CTA head and neck and perfusion study  As the patient was coming back from the CT a I was notified by the stroke nurse that the symptoms have reoccurred she now has much more profound dysarthria, left facial droop as well as left hemiparesis  I immediately reconnected on video reevaluated the patient the stroke scale at this time had increased from a 1 to a 6  I asked nursing staff to recycle the blood pressure to assure that we were within a safe range for treatment with IV thrombolytic therapy  Final read of the noncontrast CT of the head had also been back which was unremarkable  CTA head and neck and perfusion were already performed and per my interpretation did not reveal any large vessel occlusion      Past Medical History:      There is no history of Hypertension      There is no history of Diabetes Mellitus      There is no history of Hyperlipidemia      There is no history of Atrial Fibrillation  There is no history of Coronary Artery Disease      There is no history of Stroke      There is no history of Covid-19      There is no history of Seizures      There is no history of Migraine Headaches      There is no history of Dementia/MCI  Medications:  No Anticoagulant use  No Antiplatelet use Reviewed EMR for current medications  Allergies:  Reviewed  Social History: Alcohol Use: No Drug Use: No  Family History:  There is no family history of premature cerebrovascular disease pertinent to this consultation  ROS : 14 Points Review of Systems was performed and was negative except mentioned in HPI.  Past Surgical History: There Is No  Surgical History Contributory To Today's Visit    Examination: BP(122/88), Pulse(74), Blood Glucose(132) 1A: Level of Consciousness - Alert; keenly responsive + 0 1B: Ask Month and Age - Both Questions Right + 0 1C: Blink Eyes & Squeeze Hands - Performs Both Tasks + 0 2: Test Horizontal Extraocular Movements - Normal + 0 3: Test Visual Fields - No Visual Loss + 0 4: Test Facial Palsy (Use Grimace if Obtunded) - Partial paralysis (lower face) + 2 5A: Test Left Arm Motor Drift - Drift, but doesn't hit bed + 1 5B: Test Right Arm Motor Drift - No Drift for 10 Seconds + 0 6A: Test Left Leg Motor Drift - Drift, hits bed + 2 6B: Test Right Leg Motor Drift - No Drift for 5 Seconds + 0 7: Test Limb Ataxia (FNF/Heel-Shin) - No Ataxia + 0 8: Test Sensation - Normal; No sensory loss + 0 9: Test Language/Aphasia - Normal; No aphasia + 0 10: Test Dysarthria - Mild-Moderate Dysarthria: Slurring but can be understood + 1 11: Test Extinction/Inattention - No abnormality + 0  NIHSS Score: 6  Pre-Morbid Modified Rankin Scale: 0 Points = No symptoms at all  Spoke with : Dr. Alejo Amsler  This consult was conducted in real time using interactive audio and Immunologist. Patient was informed of the technology being used for this visit and agreed to proceed. Patient located in hospital and provider located at home/office setting.   Patient is being evaluated for possible acute neurologic impairment and high probability of imminent or life-threatening deterioration. I spent total of 35 minutes providing care to this patient, including time for face to face visit via telemedicine, review of medical records, imaging studies and discussion of findings with providers, the patient and/or family.   Dr Shellee Devonshire   [facility] For Inpatient follow-up with [facility] physician please call [facility] at [phone]. As we are not an outpatient service for any post hospital discharge needs please contact  the hospital for assistance.  If you have any questions for the [facility] physicians or need to reconsult for clinical or diagnostic changes please contact us  via [facility] at [phone].

## 2024-04-27 NOTE — ED Notes (Signed)
 Code stroke activated in the field

## 2024-04-27 NOTE — ED Provider Notes (Signed)
 [facility] Provider Note   Event Date/Time   First MD Initiated Contact with Patient 04/27/24 2138     (approximate) History  Code Stroke  HPI Anita Reilly is a 78 y.o. female with a past medical history of Bell's palsy on the right who presents with concerns of left facial droop, right lower extremity weakness, and difficulty word finding.  Patient's symptoms have resolved except for mild left facial droop upon arrival.  Patient denies any symptoms similar to this in the past.  Last known well time 2015 ROS: Patient currently denies any vision changes, tinnitus, difficulty speaking, facial droop, sore throat, chest pain, shortness of breath, abdominal pain, nausea/vomiting/diarrhea, dysuria, or weakness/numbness/paresthesias in any extremity   Physical Exam  Triage Vital Signs: ED Triage Vitals  Encounter Vitals Group     BP 04/27/24 2152 123/80     Systolic BP Percentile --      Diastolic BP Percentile --      Pulse Rate 04/27/24 2152 70     Resp 04/27/24 2152 (!) 8     Temp 04/27/24 2158 98 F (36.7 C)     Temp Source 04/27/24 2158 Oral     SpO2 04/27/24 2151 95 %     Weight 04/27/24 2157 175 lb 8 oz (79.6 kg)     Height 04/27/24 2157 5\' 4"  (1.626 m)     Head Circumference --      Peak Flow --      Pain Score 04/27/24 2156 0     Pain Loc --      Pain Education --      Exclude from Growth Chart --    Most recent vital signs: Vitals:   04/27/24 2252 04/27/24 2307  BP: (!) 152/45 (!) 150/88  Pulse: 74 74  Resp: 16 18  Temp: 97.7 F (36.5 C)   SpO2: 100% 100%   General: Awake, oriented x4. CV:  Good peripheral perfusion. Resp:  Normal effort. Abd:  No distention. Other:  Elderly obese Caucasian female resting comfortably in no acute distress.  Mild left facial droop ED Results / Procedures / Treatments  Labs (all labs ordered are listed, but only abnormal results are displayed) Labs Reviewed  COMPREHENSIVE METABOLIC PANEL WITH GFR  - Abnormal; Notable for the following components:      Result Value   Glucose, Bld 145 (*)    BUN 28 (*)    Creatinine, Ser 1.61 (*)    Calcium 10.4 (*)    GFR, Estimated 32 (*)    All other components within normal limits  CBG MONITORING, ED - Abnormal; Notable for the following components:   Glucose-Capillary 132 (*)    All other components within normal limits  PROTIME-INR  APTT  CBC  DIFFERENTIAL  ETHANOL   EKG ED ECG REPORT I, Charleen Conn, the attending physician, personally viewed and interpreted this ECG. Date: 04/27/2024 EKG Time: 2205 Rate: 70 Rhythm: normal sinus rhythm QRS Axis: normal Intervals: normal ST/T Wave abnormalities: normal Narrative Interpretation: no evidence of acute ischemia RADIOLOGY ED MD interpretation: CT of the head without contrast interpreted by me shows no evidence of acute abnormalities including no intracerebral hemorrhage, obvious masses, or significant edema CT angiography of the head and neck with perfusion shows no emergent large vessel occlusion or high-grade stenosis of the intracranial arteries and a normal CT perfusion - All radiology independently interpreted and agree with radiology assessment Official radiology report(s): CT ANGIO HEAD NECK W WO  CM W PERF (CODE STROKE) Result Date: 04/27/2024 CLINICAL DATA:  Acute neurologic deficit EXAM: CT ANGIOGRAPHY HEAD AND NECK CT PERFUSION BRAIN TECHNIQUE: Multidetector CT imaging of the head and neck was performed using the standard protocol during bolus administration of intravenous contrast. Multiplanar CT image reconstructions and MIPs were obtained to evaluate the vascular anatomy. Carotid stenosis measurements (when applicable) are obtained utilizing NASCET criteria, using the distal internal carotid diameter as the denominator. Multiphase CT imaging of the brain was performed following IV bolus contrast injection. Subsequent parametric perfusion maps were calculated using RAPID software.  RADIATION DOSE REDUCTION: This exam was performed according to the departmental dose-optimization program which includes automated exposure control, adjustment of the mA and/or kV according to patient size and/or use of iterative reconstruction technique. CONTRAST:  OMNIPAQUE IOHEXOL 350 MG/ML SOLN COMPARISON:  Head CT 04/27/2024 FINDINGS: CTA NECK FINDINGS Skeleton: No acute abnormality or high grade bony spinal canal stenosis. Other neck: Normal pharynx, larynx and major salivary glands. No cervical lymphadenopathy. Unremarkable thyroid gland. Upper chest: No pneumothorax or pleural effusion. No nodules or masses. Aortic arch: There is calcific atherosclerosis of the aortic arch. Conventional 3 vessel aortic branching pattern. RIGHT carotid system: Normal without aneurysm, dissection or stenosis. LEFT carotid system: Normal without aneurysm, dissection or stenosis. Vertebral arteries: Codominant configuration. There is no dissection, occlusion or flow-limiting stenosis to the skull base (V1-V3 segments). CTA HEAD FINDINGS POSTERIOR CIRCULATION: Vertebral arteries are normal. No proximal occlusion of the anterior or inferior cerebellar arteries. Basilar artery is normal. Superior cerebellar arteries are normal. Posterior cerebral arteries are normal. ANTERIOR CIRCULATION: Intracranial internal carotid arteries are normal. Anterior cerebral arteries are normal. Middle cerebral arteries are normal. Venous sinuses: As permitted by contrast timing, patent. Anatomic variants: None Review of the MIP images confirms the above findings. CT Brain Perfusion Findings: ASPECTS: 10 CBF (<30%) Volume: 0mL Perfusion (Tmax>6.0s) volume: 0mL Mismatch Volume: 0mL Infarction Location:None IMPRESSION: 1. No emergent large vessel occlusion or high-grade stenosis of the intracranial arteries. 2. Normal CT perfusion. Aortic Atherosclerosis (ICD10-I70.0). Electronically Signed   By: Juanetta Nordmann M.D.   On: 04/27/2024 22:29   CT  HEAD CODE STROKE WO CONTRAST Result Date: 04/27/2024 CLINICAL DATA:  Code stroke. Neuro deficit, concern for stroke, left-sided facial weakness, slurred speech. EXAM: CT HEAD WITHOUT CONTRAST TECHNIQUE: Contiguous axial images were obtained from the base of the skull through the vertex without intravenous contrast. RADIATION DOSE REDUCTION: This exam was performed according to the departmental dose-optimization program which includes automated exposure control, adjustment of the mA and/or kV according to patient size and/or use of iterative reconstruction technique. COMPARISON:  MRI head 08/16/2012. FINDINGS: Brain: No acute intracranial hemorrhage. No CT evidence of acute infarct. No edema, mass effect, or midline shift. The basilar cisterns are patent. Ventricles: The ventricles are normal. Vascular: Atherosclerotic calcifications of the carotid siphons and intracranial vertebral arteries. No hyperdense vessel. Skull: No acute or aggressive finding. Orbits: Bilateral lens replacement. Sinuses: The visualized paranasal sinuses are clear. Other: Mastoid air cells are clear. ASPECTS [facility] Stroke Program Early CT Score) - Ganglionic level infarction (caudate, lentiform nuclei, internal capsule, insula, M1-M3 cortex): 7 - Supraganglionic infarction (M4-M6 cortex): 3 Total score (0-10 with 10 being normal): 10 IMPRESSION: 1. No CT evidence of acute intracranial abnormality. 2. ASPECTS is 10 These results were called by telephone at the time of interpretation on 04/27/2024 at 9:55 pm to provider [facility] , who verbally acknowledged these results. Electronically Signed   By: Denny Flack  M.D.   On: 04/27/2024 21:55   PROCEDURES: Critical Care performed: Yes, see critical care procedure note(s) .1-3 Lead EKG Interpretation  Performed by: Charleen Conn, MD Authorized by: Charleen Conn, MD     Interpretation: normal     ECG rate:  71   ECG rate assessment: normal     Rhythm: sinus rhythm     Ectopy:  none     Conduction: normal   CRITICAL CARE Performed by: Abbigail Anstey K Dejan Angert  Total critical care time: 41 minutes  Critical care time was exclusive of separately billable procedures and treating other patients.  Critical care was necessary to treat or prevent imminent or life-threatening deterioration.  Critical care was time spent personally by me on the following activities: development of treatment plan with patient and/or surrogate as well as nursing, discussions with consultants, evaluation of patient's response to treatment, examination of patient, obtaining history from patient or surrogate, ordering and performing treatments and interventions, ordering and review of laboratory studies, ordering and review of radiographic studies, pulse oximetry and re-evaluation of patient's condition.  MEDICATIONS ORDERED IN ED: Medications  sodium chloride flush (NS) 0.9 % injection 3 mL (3 mLs Intravenous Not Given 04/27/24 2216)  iohexol (OMNIPAQUE) 350 MG/ML injection 100 mL (100 mLs Intravenous Contrast Given 04/27/24 2209)  tenecteplase (TNKASE) injection for Stroke 20 mg (20 mg Intravenous Given 04/27/24 2237)   IMPRESSION / MDM / ASSESSMENT AND PLAN / ED COURSE  I reviewed the triage vital signs and the nursing notes.                             The patient is on the cardiac monitor to evaluate for evidence of arrhythmia and/or significant heart rate changes. Patient's presentation is most consistent with acute presentation with potential threat to life or bodily function. Stroke alert PMH risk factors: Vertigo Neurologic Deficits: Left-sided facial droop, difficulty word finding, left lower extremity weakness Last known Well Time: 2015 NIH Stroke Score: 1-->6 Given History and Exam I have lower suspicion for infectious etiology, neurologic changes secondary to toxicologic ingestion, seizure, complex migraine. Presentation concerning for possible stroke requiring workup.  Workup: Labs: POC  glucose, CBC, BMP, LFTs, Troponin, PT/INR, PTT, Type and Screen Other Diagnostics: ECG, CXR, non-contrast head CT followed by CTA brain and neck (to r/o large vessel occlusion amenable to thrombectomy) Interventions: Patient low on NIH scale and out of the window for tpa.  Consult: Neurology. Discussed with Dr. Shann Darnel regarding patient's neurological symptoms and last well-known time and eligibility for TPA criteria.  As patient had recurrence of her symptoms here in our emergency department, he does recommend TNK administration at this time Disposition: Admission to ICU.   FINAL CLINICAL IMPRESSION(S) / ED DIAGNOSES   Final diagnoses:  Cerebrovascular accident (CVA), unspecified mechanism (HCC)  Facial droop  Weakness of right lower extremity  Dysarthria   Rx / DC Orders   ED Discharge Orders     None      Note:  This document was prepared using Dragon voice recognition software and may include unintentional dictation errors.   Porshea Janowski K, MD 04/27/24 [phone]

## 2024-04-27 NOTE — ED Notes (Addendum)
 This RN, Arts development officer at bedside, pt with sudden onset worsening of L facial droop and slurred speech.

## 2024-04-27 NOTE — Progress Notes (Signed)
 2142 - Code Stroke Activated, Patient in CT   LKWT 2015, mRS 0. Patient was at a wedding when she states she got hot and did not feel well. She went to sit down and had a possible syncopal episode per family, though patient does not recollect this happening. Once coherent patient had L facial droop, L leg flaccid, & slurred speech. Patient can now move L leg on arrival to ED.  2147 - Tele-Neurologist paged   2150 - Patient returned to room from CT   2152 - Dr. Ellery Guthrie joined stroke cart  2155 - NCCT results given on camera to Dr. Ellery Guthrie  2205 - Patient to back to CT for advanced imaging   2212 - Made Dr. Ellery Guthrie aware patient in advanced imaging and just prior to leaving her L facial droop and slurred speech worsened.  2222 - Dr. Ellery Guthrie re-joined stroke cart to discuss TNK due to patient worsening   2230 - CTA & CTP results given to Dr. Ellery Guthrie   2231 - Timeout to verify patient, contraindications, and dosing  2237 - TNK pushed 20 mg

## 2024-04-28 ENCOUNTER — Inpatient Hospital Stay

## 2024-04-28 ENCOUNTER — Inpatient Hospital Stay (HOSPITAL_COMMUNITY)
Admit: 2024-04-28 | Discharge: 2024-04-28 | Disposition: A | Discharge status: Home / Self Care | Attending: Nurse Practitioner | Admitting: Nurse Practitioner

## 2024-04-28 DIAGNOSIS — G510 Bell's palsy: Secondary | ICD-10-CM

## 2024-04-28 DIAGNOSIS — R55 Syncope and collapse: Secondary | ICD-10-CM | POA: Diagnosis not present

## 2024-04-28 DIAGNOSIS — I6389 Other cerebral infarction: Secondary | ICD-10-CM | POA: Diagnosis not present

## 2024-04-28 DIAGNOSIS — R7303 Prediabetes: Secondary | ICD-10-CM

## 2024-04-28 DIAGNOSIS — I10 Essential (primary) hypertension: Secondary | ICD-10-CM | POA: Diagnosis not present

## 2024-04-28 DIAGNOSIS — E785 Hyperlipidemia, unspecified: Secondary | ICD-10-CM

## 2024-04-28 DIAGNOSIS — I639 Cerebral infarction, unspecified: Secondary | ICD-10-CM | POA: Diagnosis not present

## 2024-04-28 LAB — LIPID PANEL
Cholesterol: 253 mg/dL — ABNORMAL HIGH (ref 0–200)
HDL: 49 mg/dL (ref >40)
LDL Cholesterol: 179 mg/dL — ABNORMAL HIGH (ref 0–99)
Total CHOL/HDL Ratio: 5.2 ratio
Triglycerides: 125 mg/dL (ref <150)
VLDL: 25 mg/dL (ref 0–40)

## 2024-04-28 LAB — ECHOCARDIOGRAM COMPLETE
AR max vel: 2 cm2
AV Peak grad: 10.8 mmHg
Ao pk vel: 1.64 m/s
Area-P 1/2: 2.37 cm2
Calc EF: 63.6 %
Height: 64 in
S' Lateral: 2.8 cm
Single Plane A2C EF: 62.7 %
Single Plane A4C EF: 64.5 %
Weight: 2807.78 [oz_av]

## 2024-04-28 LAB — GLUCOSE, CAPILLARY: Glucose-Capillary: 128 mg/dL — ABNORMAL HIGH (ref 70–99)

## 2024-04-28 LAB — MRSA NEXT GEN BY PCR, NASAL: MRSA by PCR Next Gen: NOT DETECTED

## 2024-04-28 MED ORDER — TRAZODONE HCL 50 MG PO TABS: 50.0000 mg | ORAL_TABLET | Freq: Every evening | ORAL | Status: DC | PRN

## 2024-04-28 MED ORDER — ORAL CARE MOUTH RINSE: 15.0000 mL | OROMUCOSAL | Status: DC | PRN

## 2024-04-28 MED ORDER — LORAZEPAM 2 MG/ML IJ SOLN
1.0000 mg | Freq: Once | INTRAMUSCULAR | Status: AC
  Administered 2024-04-28: 1 mg via INTRAVENOUS
  Filled 2024-04-28: qty 1

## 2024-04-28 MED ORDER — CHLORHEXIDINE GLUCONATE CLOTH 2 % EX PADS
6.0000 | MEDICATED_PAD | Freq: Every day | CUTANEOUS | Status: DC
  Administered 2024-04-28 – 2024-04-29 (×2): 6 via TOPICAL

## 2024-04-28 MED ORDER — PERFLUTREN LIPID MICROSPHERE
1.0000 mL | INTRAVENOUS | Status: AC | PRN
  Administered 2024-04-28: 5 mL via INTRAVENOUS
  Filled 2024-04-28: qty 10

## 2024-04-28 MED ORDER — ATORVASTATIN CALCIUM 80 MG PO TABS
80.0000 mg | ORAL_TABLET | Freq: Every day | ORAL | Status: DC
  Administered 2024-04-28: 80 mg via ORAL
  Filled 2024-04-28: qty 1

## 2024-04-28 NOTE — Progress Notes (Signed)
 PT Cancellation Note  Patient Details Name: Anita Reilly MRN: [id number] DOB: Dec 09, 1945   Cancelled Treatment:    Reason Eval/Treat Not Completed: Medical issues which prohibited therapy (Consult received, chart reviewed. Per chart, pt arrived as code stroke and was adminstered tNK at 2237 at 6/7. Awaiting 24hrs s/p tNK and follow up CT. Will re-attempt tomorrow if pt medically stable.)   Alzina Golda H. Bevin Bucks, PT, DPT, NCS 04/28/24, 9:08 AM [phone]

## 2024-04-28 NOTE — Progress Notes (Signed)
 NEUROLOGY CONSULT FOLLOW UP NOTE   Date of service: April 28, 2024 Patient Name: Anita Reilly MRN:  [id number] DOB:  1946-11-15  Interval Hx/subjective   Confirms history provided to video neurologist yesterday.  Daughter at bedside.  Feels that she is improving but not at baseline.  Does have some minor sensory complaints today on her left lower leg and in her mouth but these are minor  Vitals   Vitals:   04/28/24 0615 04/28/24 0630 04/28/24 0637 04/28/24 0800  BP: 125/71 122/69 122/69   Pulse: 70 69    Resp: 18 16    Temp:    97.8 F (36.6 C)  TempSrc:    Axillary  SpO2: 98% 97%    Weight:      Height:         Body mass index is 30.12 kg/m.  Physical Exam   Constitutional: Appears well-developed and well-nourished.  Psych: Affect appropriate to situation, mildly anxious, pleasant, cooperative Eyes: No scleral injection HENT: No oropharyngeal obstruction.  MSK: no major joint deformities.  Cardiovascular: Normal rate and regular rhythm. Perfusing extremities well Respiratory: Effort normal, non-labored breathing GI: Soft.  No distension. There is no tenderness.  Skin: Warm dry and intact visible skin  Neurologic Examination   Mental Status: Patient is awake, alert, oriented to person, place, month, year, and situation. Patient is able to give a clear and coherent history. No signs of aphasia or neglect Cranial Nerves: II: Visual Fields are full. Pupils are equal, round, and reactive to light.   III,IV, VI: EOMI without ptosis or diploplia.  V: Facial sensation is symmetric to temperature VII: Facial movement is notable for baseline reduced movement of the right face, no synkinesis. Slightly fluctuating left facial droop as well.  VIII: hearing is intact to voice X: Uvula reduced elevation on the right  XI: Shoulder shrug is symmetric. XII: tongue is midline without atrophy or fasciculations.  Motor: Tone is normal. Bulk is normal. 4/5 deltoid and hip  flexion on the right otherwise 5/5 on the right, 4- to 4+ on the left in an upper motor neuron pattern Sensory: Sensation is symmetric on the face and arms but slightly reduced in the left leg  Cerebellar: FNF and HKS are intact bilaterally Gait:  Deferred in acute setting   Medications  Current Facility-Administered Medications:     stroke: early stages of recovery book, , Does not apply, Once, Ouma, Phoebe Breed, NP   acetaminophen  (TYLENOL ) tablet 650 mg, 650 mg, Oral, Q4H PRN **OR** acetaminophen  (TYLENOL ) 160 MG/5ML solution 650 mg, 650 mg, Per Tube, Q4H PRN **OR** acetaminophen  (TYLENOL ) suppository 650 mg, 650 mg, Rectal, Q4H PRN, Ouma, Phoebe Breed, NP   Oral care mouth rinse, 15 mL, Mouth Rinse, PRN, Auston Left, Kurian, MD   pantoprazole (PROTONIX) injection 40 mg, 40 mg, Intravenous, QHS, Ouma, Phoebe Breed, NP   senna-docusate (Senokot-S) tablet 1 tablet, 1 tablet, Oral, QHS PRN, Ouma, Phoebe Breed, NP   sodium chloride flush (NS) 0.9 % injection 3 mL, 3 mL, Intravenous, Once, Bradler, Evan K, MD   sodium chloride flush (NS) 0.9 % injection 3-10 mL, 3-10 mL, Intravenous, Q12H, Ouma, Phoebe Breed, NP   sodium chloride flush (NS) 0.9 % injection 3-10 mL, 3-10 mL, Intravenous, PRN, Ouma, Phoebe Breed, NP  Facility-Administered Medications Ordered in Other Encounters:    perflutren lipid microspheres (DEFINITY) IV suspension, 1-10 mL, Intravenous, PRN, Ouma, Elizabeth Achieng, NP, 5 mL at 04/28/24 0856  Labs and Diagnostic Imaging   CBC:  Recent Labs  Lab 04/27/24 2140  WBC 7.9  NEUTROABS 4.5  HGB 14.3  HCT 41.6  MCV 91.6  PLT 289    Basic Metabolic Panel:  Lab Results  Component Value Date   NA 136 04/27/2024   K 3.8 04/27/2024   CO2 26 04/27/2024   GLUCOSE 145 (H) 04/27/2024   BUN 28 (H) 04/27/2024   CREATININE 1.61 (H) 04/27/2024   CALCIUM 10.4 (H) 04/27/2024   GFRNONAA 32 (L) 04/27/2024   GFRAA >90 08/16/2012   Lipid Panel: No  results found for: "LDLCALC"  HgbA1c: No results found for: "HGBA1C"  Alcohol Level     Component Value Date/Time   ETH <15 04/27/2024 2140   INR  Lab Results  Component Value Date   INR 1.0 04/27/2024   APTT  Lab Results  Component Value Date   APTT 26 04/27/2024   AED levels: No results found for: "PHENYTOIN", "ZONISAMIDE", "LAMOTRIGINE", "LEVETIRACETA"  CT Head without contrast(report reviewed): 1. No CT evidence of acute intracranial abnormality. 2. ASPECTS is 10  CT angio Head and Neck with contrast with perfusion (Personally reviewed): 1. No emergent large vessel occlusion or high-grade stenosis of the intracranial arteries. 2. Normal CT perfusion.  MRI Brain(Personally reviewed): 1. No acute intracranial abnormality. 2. Chronic microangiopathic white matter changes.   ECHO:  Pending  Assessment   Anita Reilly is a 78 y.o. female with a past medical history significant for right Bell's palsy (2014), hypertension, hyperlipidemia, prediabetes, BMI 30, who presented with sudden onset left-sided weakness after syncopal episode while attending a wedding.  Initially her symptoms were rapidly improving however recurred and therefore TNK was administered due to disabling symptoms.  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 negative brainstem stroke, s/p TNK - Stroke labs HgbA1c, fasting lipid panel pending - Repeat Head CT 24 hours post TNK ~22:00 today - Frequent neuro checks - Echocardiogram report pending - Hold antiplatelets and chemo DVT Ppx for 24 hours until post TNK head imaging completed  - Risk factor modification - Telemetry monitoring - Blood pressure goal   - Post TNK for 24  hours < 180/105, then gradually normalize over 2-4 days  - PT consult, OT  consult, Speech consult - Neurology team to follow ______________________________________________________________________   Signed, Ronnette Coke, MD [facility]  [facility] coverage for [facility] is from 8 AM to 4 AM in-house and 4 PM to 8 PM by telephone/video. 8 PM to 8 AM emergent questions or overnight urgent questions should be addressed to Teleneurology On-call or Arlin Benes neurohospitalist; contact information can be found on AMION   CRITICAL CARE Performed by: Ronnette Coke   Total critical care time: 30 minutes  Critical care time was exclusive of separately billable procedures and treating other patients.  Critical care was necessary to treat or prevent imminent or life-threatening deterioration, close monitoring s/p TNK  Critical care was time spent personally by me on the following activities: development of treatment plan with patient and/or surrogate as well as nursing, discussions with consultants, evaluation of patient's response to treatment, examination of patient, obtaining history from patient or surrogate, ordering and performing treatments and interventions, ordering and review of laboratory studies, ordering and review of radiographic studies, pulse oximetry and re-evaluation of patient's condition.

## 2024-04-28 NOTE — Progress Notes (Signed)
 OT Cancellation Note  Patient Details Name: Anita Reilly MRN: [id number] DOB: [birth year]   Cancelled Treatment:    Reason Eval/Treat Not Completed: Medical issues which prohibited therapy. Order received, chart reviewed. Per chart, pt arrived as code stroke and was adminstered tNK at 2237 at 6/7. Awaiting 24hrs s/p tNK and follow up CT. Will re-attempt tomorrow if pt medically stable.  Rhian Funari E Kamdin Follett 04/28/2024, 8:36 AM

## 2024-04-28 NOTE — Progress Notes (Signed)
 NAME:  Anita Reilly, MRN:  [id number], DOB:  Apr 22, 1946, LOS: 1 ADMISSION DATE:  04/27/2024, CONSULTATION DATE: 04/28/2024  History of Present Illness:  Anita Reilly is an 78 y.o. female with PMHx of right face Bell's Palsy, HTN, HLD, RLS, GERD, Asthma and Depression who presented to the ED as a code stroke.   Per EMS runsheet, patient was at a wedding when she c/o feeling very hot and went to sit down. A bystander noticed she was almost unresponsive and called 911. When EMS arrive, pt was sitting upright on the curb alongside FD, MED1, and wedding attendees. Pt was a & ox4 with clammy skin however bystanders advised they had poured water on her in attempts to alleviate her symptom(s).  Initial stroke screen performed noted total loss of motor function in her left leg as well as left facial droop with an LVO score of 2 and a " last known well" of 20:15. Pt denied taking blood thinners or known cardiac hx. Pt was able to stand and pivot to lay on stretcher where she was secured with straps and rails. pt' s left leg deficit had resolved and she had regained normal motor function however she still had residual left- sided facial droop throughout transport. Pt does have reported hx of Bell' s Palsy affecting the right- side of her face. " Code Stroke" activated en route to the ED.    ED Course: Initial vital signs showed HR of 77 beats/minute, BP 123/80 mm Hg, the RR 18 breaths/minute, and the oxygen saturation 94% on RA  and a temperature of 98.43F (36.0C). CODE stroke was initiated and patient evaluated by a teleneurologist. A stat CTH and CTA head obtained did not reveal any large vessel occlusion and for that reason the patient was not considered a thrombectomy candidate  however she deemed her a candidate for TNK.   Pertinent Labs/Diagnostics Findings: Glucose:145 BUN/Cr.:28/1.61 WBC: unremarkable Date last known well: 04/27/24 Time last known well: Time: 21:15 Initial NIHSS:6 TNK  Given: Yes @22 :37  Pertinent  Medical History  Arthritis  Bell's Palsy (2016) Vertigo  Asthma  Essential HTN  Prediabetes   Significant Hospital Events: Including procedures, antibiotic start and stop dates in addition to other pertinent events   06/07: Pt admitted with stroke-like symptoms s/p TNK  06/08: MR Brain: No acute intracranial abnormality. Chronic microangiopathic white matter        changes. 06/09: Pt continues to have left-sided facial droop and left upper/lower extremity hemiparesis   Interim History / Subjective:  As outlined above under significant events   Objective    Blood pressure 122/69, pulse 69, temperature 98 F (36.7 C), temperature source Oral, resp. rate 16, height 5\' 4"  (1.626 m), weight 79.6 kg, SpO2 97%.       No intake or output data in the 24 hours ending 04/28/24 0746 Filed Weights   04/27/24 2157 04/27/24 2213  Weight: 79.6 kg 79.6 kg    Examination: General: Acutely-ill appearing female, NAD on 2L O2 via nasal canula HENT: Supple, no JVD  Lungs: Clear throughout, even, non labored  Cardiovascular: NSR, s1s2, no m/r/g, 2+ radial/2+ distal pulses, no edema  Abdomen: +BS x4, soft, non tender, non distended  Extremities: Left-sided upper/lower extremity weakness, left facial droop  Neuro: Alert and oriented, follows commands, PERRLA, left-sided upper/lower extremity weakness, left facial droop  GU: Voiding   Resolved problem list   Assessment and Plan   #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 ~  presenting w/near syncope, left facial droop and left sided weakness Hx: Essential HTN and HLD  - Continuous telemetry monitoring  - Neuro exams per NIH stroke scale  - Provide stroke education  - No lines, catheters, antiplatelet or anticoagulants x24 hr after IV TNK   - Allow for permissive hypertension, keep BP <185/100  - TTE to r/o cardioembolic source, assess EF  - Lipid panel, HgA1c, and thyroid panel pending  - Avoid  chemical VTE px for now  - Repeat CT Head 24 hours post TNK  - PT consult, OT consult, Speech consult  - NPO until able to pass swallow evaluation  - Stat CT Head for acute neurological changes  - Neurology consulted appreciate input    #Right facial paralysis (Bell's Palsy) - Resume outpatient lyrica once able to tolerate po's   #Prediabetes - Hemoglobin A1c pending  - Follow hypo/hyperglycemic protocol   Best Practice (right click and "Reselect all SmartList Selections" daily)   Diet/type: NPO until able to pass swallowing evaluation  DVT prophylaxis SCD Pressure ulcer(s): N/A GI prophylaxis: N/A Lines: N/A Foley:  N/A Code Status:  full code Last date of multidisciplinary goals of care discussion [04/28/2024]  06/08: Updated pt at bedside regarding plan of care Labs   CBC: Recent Labs  Lab 04/27/24 2140  WBC 7.9  NEUTROABS 4.5  HGB 14.3  HCT 41.6  MCV 91.6  PLT 289    Basic Metabolic Panel: Recent Labs  Lab 04/27/24 2140  NA 136  K 3.8  CL 100  CO2 26  GLUCOSE 145*  BUN 28*  CREATININE 1.61*  CALCIUM 10.4*   GFR: Estimated Creatinine Clearance: 28.9 mL/min (A) (by C-G formula based on SCr of 1.61 mg/dL (H)). Recent Labs  Lab 04/27/24 2140  WBC 7.9    Liver Function Tests: Recent Labs  Lab 04/27/24 2140  AST 26  ALT 19  ALKPHOS 77  BILITOT 0.7  PROT 7.8  ALBUMIN 4.1   No results for input(s): "LIPASE", "AMYLASE" in the last 168 hours. No results for input(s): "AMMONIA" in the last 168 hours.  ABG No results found for: "PHART", "PCO2ART", "PO2ART", "HCO3", "TCO2", "ACIDBASEDEF", "O2SAT"   Coagulation Profile: Recent Labs  Lab 04/27/24 2140  INR 1.0    Cardiac Enzymes: No results for input(s): "CKTOTAL", "CKMB", "CKMBINDEX", "TROPONINI" in the last 168 hours.  HbA1C: No results found for: "HGBA1C"  CBG: Recent Labs  Lab 04/27/24 2140 04/28/24 0046  GLUCAP 132* 128*    Review of Systems: Positives in BOLD   Gen: Denies  fever, chills, weight change, fatigue, night sweats HEENT: Denies blurred vision, double vision, hearing loss, tinnitus, sinus congestion, rhinorrhea, sore throat, neck stiffness, dysphagia PULM: Denies shortness of breath, cough, sputum production, hemoptysis, wheezing CV: Denies chest pain, edema, orthopnea, paroxysmal nocturnal dyspnea, palpitations GI: Denies abdominal pain, nausea, vomiting, diarrhea, hematochezia, melena, constipation, change in bowel habits GU: Denies dysuria, hematuria, polyuria, oliguria, urethral discharge Endocrine: Denies hot or cold intolerance, polyuria, polyphagia or appetite change Derm: Denies rash, dry skin, scaling or peeling skin change Heme: Denies easy bruising, bleeding, bleeding gums Neuro: headache, numbness, left-sided upper/lower extremity weakness, left facial droop slurred speech, loss of memory or consciousness  Past Medical History:  She,  has a past medical history of Arthritis, Bell's palsy (2016), and Vertigo.   Surgical History:   Past Surgical History:  Procedure Laterality Date   BREAST BIOPSY Left 05/06/2021   u/s bx, hydromark, path pending   CARPAL TUNNEL RELEASE  left    CATARACT EXTRACTION W/PHACO Right 09/19/2018   Procedure: CATARACT EXTRACTION PHACO AND INTRAOCULAR LENS PLACEMENT (IOC) RIGHT;  Surgeon: Annell Kidney, MD;  Location: [facility];  Service: Ophthalmology;  Laterality: Right;  latex sensitivity   CATARACT EXTRACTION W/PHACO Left 10/10/2018   Procedure: CATARACT EXTRACTION PHACO AND INTRAOCULAR LENS PLACEMENT (IOC)  LEFT;  Surgeon: Annell Kidney, MD;  Location: [facility];  Service: Ophthalmology;  Laterality: Left;  Latex sensitivity requests arrival around 10 or 10:30   COLONOSCOPY       Social History:   reports that she has never smoked. She has never used smokeless tobacco. She reports that she does not drink alcohol and does not use drugs.   Family History:  Her family  history is negative for Breast cancer.   Allergies Allergies  Allergen Reactions   Latex     Dental gloves caused lip swelling   Sulfa Antibiotics Swelling    lips   Tylox [Oxycodone-Acetaminophen ]    Penicillins Rash    Veetids caused rash     Home Medications  Prior to Admission medications   Medication Sig Start Date End Date Taking? Authorizing Provider  desipramine (NOPRAMIN) 10 MG tablet Take 10 mg by mouth daily.   Yes [provider]  losartan (COZAAR) 50 MG tablet Take 50 mg by mouth daily.   Yes [provider]  Calcium Carbonate-Vitamin D (CALTRATE 600+D PO) Take 1 tablet by mouth daily.    [provider]  pregabalin (LYRICA) 25 MG capsule Take 1 capsule by mouth 2 (two) times daily. 02/26/24   [provider]     Critical care time: 40 minutes       Janey Meek, AGNP  Pulmonary/Critical Care Pager [phone] (please enter 7 digits) PCCM Consult Pager [phone] (please enter 7 digits)

## 2024-04-28 NOTE — Evaluation (Addendum)
 Clinical/Bedside Swallow Evaluation Patient Details  Name: Anita Reilly MRN: [id number] Date of Birth: 16-Oct-1946  Today's Date: 04/28/2024 Time: SLP Start Time (ACUTE ONLY): 0915 SLP Stop Time (ACUTE ONLY): 1020 SLP Time Calculation (min) (ACUTE ONLY): 65 min  Past Medical History:  Past Medical History:  Diagnosis Date   Arthritis    shoulders   Bell's palsy 2016   Vertigo    none in several years   Past Surgical History:  Past Surgical History:  Procedure Laterality Date   BREAST BIOPSY Left 05/06/2021   u/s bx, hydromark, path pending   CARPAL TUNNEL RELEASE     left    CATARACT EXTRACTION W/PHACO Right 09/19/2018   Procedure: CATARACT EXTRACTION PHACO AND INTRAOCULAR LENS PLACEMENT (IOC) RIGHT;  Surgeon: Annell Kidney, MD;  Location: [facility];  Service: Ophthalmology;  Laterality: Right;  latex sensitivity   CATARACT EXTRACTION W/PHACO Left 10/10/2018   Procedure: CATARACT EXTRACTION PHACO AND INTRAOCULAR LENS PLACEMENT (IOC)  LEFT;  Surgeon: Annell Kidney, MD;  Location: [facility];  Service: Ophthalmology;  Laterality: Left;  Latex sensitivity requests arrival around 10 or 10:30   COLONOSCOPY     HPI:  Pt is an 78 y.o. female with PMHx of Right face Bell's Palsy(2016), HTN, HLD, RLS, GERD, Asthma and Depression who presented to the ED as a code stroke. Per EMS runsheet, patient was at a wedding when she c/o feeling very hot and went to sit down w/ decreased responsiveness. 911 called. When EMS arrive, pt was sitting upright on the curb alongside FD, MED1, and wedding attendees. Pt was a & o x4 with clammy skin however bystanders advised they had poured water on her in attempts to alleviate her symptom(s).  Initial stroke screen performed noting loss of motor function in her left leg as well as left facial droop.  Pt was able to stand and pivot to lay on stretcher for EMS.  Weakness was resolving.  Pt reported hx of Bell' s Palsy affecting  the right- side of her face.  MRI at admit at time of TNK administration was "No acute intracranial abnormality.  2. Chronic microangiopathic white matter changes.".  A repeat MRI is scheduled on 04/28/2024, 24 hours post initation of TNK and last MRI.  No CXR per chart.  Pt has no c/o swallowing issues prior; speech is "normal" and intelligible w/ facial "droop" more pronounced "when she is tired" at home, per Family report.    Assessment / Plan / Recommendation  Clinical Impression   Pt seen for BSE this AM. pt awake, alert and talkative; most pleasant. She followed instructions appropriately. Family present. Pt eager to have po's.  Pt initially on [place] o2 2L but this was removed during session by NSG, afebrile. WBC WNL.  Pt appears to present w/ functional oropharyngeal phase swallowing w/ No oropharyngeal phase dysphagia noted, No sensorimotor deficits impacting swallowing noted. Pt consumed po trials w/ No overt, clinical s/s of aspiration during po trials.  Pt appears at reduced risk for aspiration following general aspiration precautions during oral intake. OF NOTE: pt has no c/o swallowing issues prior; speech is "normal" and intelligible w/ R facial "droop" more pronounced "when she is tired" at home, per Family report.   During po trials, pt consumed all consistencies w/ no overt coughing, decline in vocal quality, or change in respiratory presentation during/post trials. O2 sats 100%. Oral phase appeared [facility] w/ timely bolus management, mastication, and control of bolus propulsion for A-P transfer for  swallowing. Oral clearing achieved w/ all trial consistencies -- moistened, soft foods given.  OM Exam revealed: no unilateral lingual weakness noted but Baseline Bell's Palsy affecting R side; any Left facial decreased tone was inconsistent. Speech fully intelligible w/ few labial sounds impacted slightly by decreased R labial tone(BP). Pt fed self w/ setup support. Bel[place]+- pt has baseline  GERD.  Recommend a Regular consistency diet w/ well-Cut meats, moistened foods; Thin liquids. Recommend general aspiration precautions, support tray setup and sitting up for pt to feed self. Reduce talking and distractions during meals. Pills WHOLE in Puree if easier for swallowing -- it was encouraged now and for D/C to the pt if desired. Recommended no straws if coughing noted.   Education given on Pills in Puree; food consistencies and easy to eat options/prep; general aspiration precautions; Reflux precautions to pt and Family present. Pt appears at her baseline re: swallowing w/ no acute needs. MDNSG to reconsult if any new needs arise. NSG updated, agreed. Recommend GI and Dietician f/u for support as needed. Precautions posted in room, chart. SLP Visit Diagnosis: Dysphagia, unspecified (R13.10) (Bell's Palsy baseline; GERD)    Aspiration Risk   (reduced following general precs.)    Diet Recommendation   Thin;Age appropriate regular = a Regular consistency diet w/ well-Cut meats, moistened foods; Thin liquids. Recommend general aspiration precautions, support tray setup and sitting up for pt to feed self. Reduce talking and distractions during meals.   Medication Administration: Whole meds with puree (if needed for ease of swallowing)    Other  Recommendations Oral Care Recommendations: Oral care BID;Patient independent with oral care (setup)     Assistance Recommended at Discharge  PRN  Functional Status Assessment Patient has not had a recent decline in their functional status  Frequency and Duration  (n/a)   (n/a)       Prognosis Prognosis for improved oropharyngeal function: Good Barriers/Prognosis Comment: Bell's Palsy baseline; GERD      Swallow Study   General Date of Onset: 04/27/24 HPI: Pt is an 78 y.o. female with PMHx of Right face Bell's Palsy(2016), HTN, HLD, RLS, GERD, Asthma and Depression who presented to the ED as a code stroke. Per EMS runsheet, patient was at a  wedding when she c/o feeling very hot and went to sit down w/ decreased responsiveness. 911 called. When EMS arrive, pt was sitting upright on the curb alongside FD, MED1, and wedding attendees. Pt was a & o x4 with clammy skin however bystanders advised they had poured water on her in attempts to alleviate her symptom(s).  Initial stroke screen performed noting loss of motor function in her left leg as well as left facial droop.  Pt was able to stand and pivot to lay on stretcher for EMS.  Weakness was resolving.  Pt reported hx of Bell' s Palsy affecting the right- side of her face.  MRI at admit at time of TNK administration was "No acute intracranial abnormality.  2. Chronic microangiopathic white matter changes.".  A repeat MRI is scheduled on 04/28/2024, 24 hours post initation of TNK and last MRI.  No CXR per chart.  Pt has no c/o swallowing issues prior; speech is "normal" and intelligible w/ facial "droop" more pronounced "when she is tired" at home, per Family report. Type of Study: Bedside Swallow Evaluation Previous Swallow Assessment: none Diet Prior to this Study: NPO Temperature Spikes Noted: No (wbc 7.9) Respiratory Status: Nasal cannula (2L but removed by NSG during session: 100% O2 sats)  History of Recent Intubation: No Behavior/Cognition: Alert;Cooperative;Pleasant mood Oral Cavity Assessment: Within Functional Limits Oral Care Completed by SLP: Yes Oral Cavity - Dentition: Adequate natural dentition Vision: Functional for self-feeding Self-Feeding Abilities: Able to feed self;Needs set up Patient Positioning: Upright in bed (supported) Baseline Vocal Quality: Normal Volitional Cough: Strong Volitional Swallow: Able to elicit    Oral/Motor/Sensory Function Overall Oral Motor/Sensory Function: Mild impairment (Baseline Bell's Palsy affecting R side; any Left facial decreased tone was inconsistent) Facial ROM: Reduced right;Suspected CN VII (facial) dysfunction (Bell's Palsy  baseline) Facial Symmetry: Abnormal symmetry right;Suspected CN VII (facial) dysfunction (Bell's Palsy baseline) Facial Strength: Within Functional Limits Lingual ROM: Within Functional Limits Lingual Symmetry: Within Functional Limits Lingual Strength: Within Functional Limits Velum:  (slightly decreased on R side?) Mandible: Within Functional Limits   Ice Chips Ice chips: Within functional limits Presentation: Spoon (fed; 3 trials)   Thin Liquid Thin Liquid: Within functional limits Presentation: Cup;Self Fed (~8 ozs)    Nectar Thick Nectar Thick Liquid: Not tested   Honey Thick Honey Thick Liquid: Not tested   Puree Puree: Within functional limits Presentation: Self Fed;Spoon (4 ozs)   Solid     Solid: Within functional limits Presentation: Self Fed (8+ trials)        Darla Edward, MS, CCC-SLP Speech Language Pathologist Rehab Services; [facility] [phone] (ascom) Tanasha Menees 04/28/2024,1:48 PM

## 2024-04-28 NOTE — Progress Notes (Signed)
 eLink Physician-Brief Progress Note Patient Name: Anita Reilly DOB: 09-Apr-1946 MRN: [id number]   Date of Service  04/28/2024  HPI/Events of Note  eICU Brief new admit note: 78 y.o. female with a past medical history of Bell's palsy on the right who presents with concerns of left facial droop, right lower extremity weakness, and difficulty word finding.  CTH neg for bleed or CVA. CTA head, neck: no LVO, aspect score 10. S/p tnkase 20 mg.    Data: EKG normal sinus. Labs are normal.   Camera: Awake and alert. Getting neuro checks done at bed side. VS stable.   eICU Interventions  Follow MRI imaging reports, pending. Neuro checks.  Echo Sz precautions     Intervention Category Major Interventions: Other:;Change in mental status - evaluation and management Evaluation Type: New Patient Evaluation  Rexann Catalan 04/28/2024, 12:43 AM

## 2024-04-28 NOTE — Plan of Care (Signed)
  Problem: Education: Goal: Knowledge of disease or condition will improve Outcome: Progressing   Problem: Coping: Goal: Will verbalize positive feelings about self Outcome: Progressing Goal: Will identify appropriate support needs Outcome: Progressing   

## 2024-04-28 NOTE — Progress Notes (Signed)
  Echocardiogram 2D Echocardiogram has been performed. Definity IV ultrasound imaging agent used on this study.  Nichola Barges 04/28/2024, 8:59 AM

## 2024-04-28 NOTE — Progress Notes (Signed)
 MD Bhagat informed this RN that she needed Lipid panel drawn on patient. Policy stands as no sticks. MD aware and wishes to proceed. Lab at bedside to draw lipid panel. Resulted.

## 2024-04-29 DIAGNOSIS — I639 Cerebral infarction, unspecified: Secondary | ICD-10-CM | POA: Diagnosis not present

## 2024-04-29 LAB — CBC WITH DIFFERENTIAL/PLATELET
Abs Immature Granulocytes: 0.01 10*3/uL (ref 0.00–0.07)
Basophils Absolute: 0.1 10*3/uL (ref 0.0–0.1)
Basophils Relative: 1 %
Eosinophils Absolute: 0.2 10*3/uL (ref 0.0–0.5)
Eosinophils Relative: 3 %
HCT: 40.2 % (ref 36.0–46.0)
Hemoglobin: 13.6 g/dL (ref 12.0–15.0)
Immature Granulocytes: 0 %
Lymphocytes Relative: 45 %
Lymphs Abs: 2.7 10*3/uL (ref 0.7–4.0)
MCH: 31.3 pg (ref 26.0–34.0)
MCHC: 33.8 g/dL (ref 30.0–36.0)
MCV: 92.4 fL (ref 80.0–100.0)
Monocytes Absolute: 0.4 10*3/uL (ref 0.1–1.0)
Monocytes Relative: 7 %
Neutro Abs: 2.6 10*3/uL (ref 1.7–7.7)
Neutrophils Relative %: 44 %
Platelets: 255 10*3/uL (ref 150–400)
RBC: 4.35 MIL/uL (ref 3.87–5.11)
RDW: 13.1 % (ref 11.5–15.5)
WBC: 6 10*3/uL (ref 4.0–10.5)
nRBC: 0 % (ref 0.0–0.2)

## 2024-04-29 LAB — BASIC METABOLIC PANEL WITH GFR
Anion gap: 6 (ref 5–15)
BUN: 20 mg/dL (ref 8–23)
CO2: 27 mmol/L (ref 22–32)
Calcium: 9.2 mg/dL (ref 8.9–10.3)
Chloride: 103 mmol/L (ref 98–111)
Creatinine, Ser: 1.06 mg/dL — ABNORMAL HIGH (ref 0.44–1.00)
GFR, Estimated: 53 mL/min — ABNORMAL LOW (ref >60)
Glucose, Bld: 110 mg/dL — ABNORMAL HIGH (ref 70–99)
Potassium: 4.5 mmol/L (ref 3.5–5.1)
Sodium: 136 mmol/L (ref 135–145)

## 2024-04-29 LAB — MAGNESIUM: Magnesium: 2.1 mg/dL (ref 1.7–2.4)

## 2024-04-29 LAB — HEMOGLOBIN A1C
Hgb A1c MFr Bld: 6.2 % — ABNORMAL HIGH (ref 4.8–5.6)
Mean Plasma Glucose: 131 mg/dL

## 2024-04-29 LAB — PHOSPHORUS: Phosphorus: 4 mg/dL (ref 2.5–4.6)

## 2024-04-29 MED ORDER — POLYETHYLENE GLYCOL 3350 17 G PO PACK
17.0000 g | PACK | Freq: Every day | ORAL | Status: DC
  Administered 2024-04-29 – 2024-05-02 (×4): 17 g via ORAL
  Filled 2024-04-29 (×4): qty 1

## 2024-04-29 MED ORDER — ATORVASTATIN CALCIUM 20 MG PO TABS
40.0000 mg | ORAL_TABLET | Freq: Every day | ORAL | Status: DC
  Administered 2024-04-29 – 2024-05-02 (×4): 40 mg via ORAL
  Filled 2024-04-29 (×4): qty 2

## 2024-04-29 MED ORDER — PANTOPRAZOLE SODIUM 40 MG PO TBEC
40.0000 mg | DELAYED_RELEASE_TABLET | Freq: Every day | ORAL | Status: DC
  Administered 2024-04-29 – 2024-05-03 (×5): 40 mg via ORAL
  Filled 2024-04-29 (×5): qty 1

## 2024-04-29 MED ORDER — CLOPIDOGREL BISULFATE 300 MG PO TABS
300.0000 mg | ORAL_TABLET | Freq: Once | ORAL | Status: AC
  Administered 2024-04-29: 300 mg via ORAL
  Filled 2024-04-29: qty 1

## 2024-04-29 MED ORDER — ASPIRIN 81 MG PO TBEC
81.0000 mg | DELAYED_RELEASE_TABLET | Freq: Every day | ORAL | Status: DC
  Administered 2024-04-29 – 2024-05-03 (×5): 81 mg via ORAL
  Filled 2024-04-29 (×5): qty 1

## 2024-04-29 MED ORDER — CLOPIDOGREL BISULFATE 75 MG PO TABS
75.0000 mg | ORAL_TABLET | Freq: Every day | ORAL | Status: DC
  Administered 2024-04-30 – 2024-05-03 (×4): 75 mg via ORAL
  Filled 2024-04-29 (×4): qty 1

## 2024-04-29 NOTE — Progress Notes (Addendum)
 SLP Cancellation Note  Patient Details Name: Anita Reilly MRN: [id number] DOB: [birth year]   Cancelled treatment:       Reason Eval/Treat Not Completed: SLP screened, no needs identified, will sign off (chart reviewed; consulted NSG then met w/ pt and family)   Pt admitted w/ concern for stroke. Head Imaging/MRI before and post TNK given revealed: "No acute intracranial abnormality.  2. Chronic microangiopathic white matter changes; Generalized atrophy and findings of chronic microvascular disease..". Neurology is following re: medications.   Pt denied any difficulty w/ her speech; felt that her communication skills were at Baseline for her. Family present agreed. Pt conversed in Full conversation w/ this SLP and multiple Family members switching topics and answering questions w/out expressive/receptive deficits noted; speech clear and Fully intelligible. MD present also and engaged in Full conversation w/ pt and Family.  Pt has Baseline dx of Bell' s Palsy(2016) affecting the right side of her mouth/face. Per Family report, the facial "droop" more pronounced "when she is tired" at home.   No further skilled Acute ST services indicated as pt appears at her communication Baseline per pt/Family report. If any decline from her previous baseline is noted post D/C to next venue of care, then PCP/MD can order ST f/u then. Pt has Family support and Supervision at D/C per pt/Family. Pt and Family agreed. NSG to reconsult if any change in status while admitted.      Darla Edward, MS, CCC-SLP Speech Language Pathologist Rehab Services; [facility] [phone] (ascom) Anita Reilly 04/29/2024, 11:26 AM

## 2024-04-29 NOTE — Progress Notes (Signed)

## 2024-04-29 NOTE — TOC Initial Note (Signed)
 Transition of Care [facility]) - Initial/Assessment Note    Patient Details  Name: Anita Reilly MRN: [id number] Date of Birth: Aug 23, 1946  Transition of Care [facility]) CM/SW Contact:    Crayton Docker, RN 04/29/2024, 4:45 PM  Clinical Narrative:                  CM to patient's room regarding TOC screening assessment. CM introduced case management role and discharge care planning process. Patient's son and patient's nephew, at patient's bedside. Patient verbalized understanding and agreement with screening interview. Patient states lives alone. Patient states has walker, single point cane and wheelchair.   CM and patient discussed acute rehab recommendations. Patient is amenable to acute rehab. CM will continue to follow for discharge care planning needs.   Noted, patient screened for CIR, and patient is possible candidate for CIR.  Expected Discharge Plan: IP Rehab Facility Barriers to Discharge: Continued Medical Work up   Patient Goals and CMS Choice    Acute Rehab    Expected Discharge Plan and Services    Acute Rehab   Living arrangements for the past 2 months: Single Family Home                   Prior Living Arrangements/Services Living arrangements for the past 2 months: Single Family Home Lives with:: Self Patient language and need for interpreter reviewed:: No        Need for Family Participation in Patient Care: Yes (Comment) Care giver support system in place?: Yes (comment) Current home services: DME (Wheelchair, SPC, walker) Criminal Activity/Legal Involvement Pertinent to Current Situation/Hospitalization: No - Comment as needed  Activities of Daily Living      Permission Sought/Granted Permission sought to share information with : Case Manager, Family Supports Permission granted to share information with : Yes, Verbal Permission Granted  Share Information with NAME: Olene Berne, Jr/Stephanie/Tina     Permission granted to share info w Relationship:  yes  Permission granted to share info w Contact Information: yes  Emotional Assessment Appearance:: Appears stated age Attitude/Demeanor/Rapport: Engaged Affect (typically observed): Calm Orientation: : Oriented to Self, Oriented to Place, Oriented to  Time, Oriented to Situation Alcohol / Substance Use: Not Applicable    Admission diagnosis:  Facial droop [R29.810] Dysarthria [R47.1] Weakness of right lower extremity [R29.898] Cerebrovascular accident (CVA), unspecified mechanism (HCC) [I63.9] Acute stroke due to ischemia [facility]) [I63.9] Patient Active Problem List   Diagnosis Date Noted   Acute stroke due to ischemia (HCC) 04/27/2024   Right sided weakness 08/16/2012   Facial droop 08/16/2012   Bell's palsy 08/16/2012   PCP:  Jimmy Moulding, MD Pharmacy:   [facility] [address], [place] - [address] [place] [address] Phone: [phone] Fax: [phone]     Social Drivers of Health (SDOH) Social History: SDOH Screenings   Food Insecurity: No Food Insecurity (04/28/2024)  Housing: Low Risk  (04/28/2024)  Transportation Needs: No Transportation Needs (04/28/2024)  Utilities: Not At Risk (04/28/2024)  Financial Resource Strain: Low Risk  (10/31/2023)   [facility]  Social Connections: Unknown (04/28/2024)  Tobacco Use: Low Risk  (04/27/2024)   SDOH Interventions:     Readmission Risk Interventions     No data to display

## 2024-04-29 NOTE — Progress Notes (Addendum)
 Progress Note   Patient: Anita Reilly UJW:[id number] DOB: Aug 26, 1946 DOA: 04/27/2024     2 DOS: the patient was seen and examined on 04/29/2024   Brief hospital course:  Anita Reilly is an 78 y.o. female with PMHx of right face Bell's Palsy, HTN, HLD, RLS, GERD, Asthma and Depression who presented to the ED as a code stroke.   Per EMS runsheet, patient was at a wedding when she c/o feeling very hot and went to sit down. A bystander noticed she was almost unresponsive and called 911. When EMS arrive, pt was sitting upright on the curb alongside FD, MED1, and wedding attendees. Pt was a & ox4 with clammy skin however bystanders advised they had poured water on her in attempts to alleviate her symptom(s).  Initial stroke screen performed noted total loss of motor function in her left leg as well as left facial droop with an LVO score of 2 and a " last known well" of 20:15. Pt denied taking blood thinners or known cardiac hx. Pt was able to stand and pivot to lay on stretcher where she was secured with straps and rails. pt' s left leg deficit had resolved and she had regained normal motor function however she still had residual left- sided facial droop throughout transport. Pt does have reported hx of Bell' s Palsy affecting the right- side of her face. " Code Stroke" activated en route to the ED.    ED Course: Initial vital signs showed HR of 77 beats/minute, BP 123/80 mm Hg, the RR 18 breaths/minute, and the oxygen saturation 94% on RA  and a temperature of 98.13F (36.0C). CODE stroke was initiated and patient evaluated by a teleneurologist. A stat CTH and CTA head obtained did not reveal any large vessel occlusion and for that reason the patient was not considered a thrombectomy candidate  however she deemed her a candidate for TNK.   Pertinent Labs/Diagnostics Findings: Glucose:145 BUN/Cr.:28/1.61 WBC: unremarkable Date last known well: 04/27/24 Time last known well: Time:  21:15 Initial NIHSS:6 TNK Given: Yes @22 :37   Assessment and Plan:  Acute CVA  ??  Stuttering lacunar MRI negative brainstem stroke status post TNK Patient presented for evaluation of near syncope, left facial droop and left-sided weakness Appreciate neurology input Continue atorvastatin and dual antiplatelet therapy with aspirin and Plavix for 21 days and then monotherapy with Plavix. Appreciate PT input, they recommend acute inpatient rehab    Hypertension Patient is normotensive Continue to hold losartan   Right facial paralysis (Bell's palsy) Continue Lyrica   AKI Improved Serum creatinine on admission was 1.60 and is back to baseline of 1.06 Hold Losartan for now     Subjective: No new complaints.  Daughter-in-law at the bedside  Physical Exam: Vitals:   04/29/24 1000 04/29/24 1037 04/29/24 1100 04/29/24 1436  BP: 132/74 (!) 145/85 (!) 123/95 134/82  Pulse: 77   76  Resp: (!) 21  19 18   Temp:    98.4 F (36.9 C)  TempSrc:      SpO2: 99%   98%  Weight:      Height:       General: Acutely-ill appearing female, NAD on 2L O2 via nasal canula HENT: Supple, no JVD  Lungs: Clear throughout, even, non labored  Cardiovascular: NSR, s1s2, no m/r/g, 2+ radial/2+ distal pulses, no edema  Abdomen: +BS x4, soft, non tender, non distended  Extremities: Left-sided upper/lower extremity weakness, left facial droop  Neuro: Alert and oriented, follows commands, PERRLA,  left-sided upper/lower extremity weakness, left facial droop  GU: Voiding     Data Reviewed: Hemoglobin A1c 6.2, creatinine 1.06, cholesterol 253 Labs Reviewed  Family Communication: Plan of care was discussed with patient and her daughter in law at the bedside.  They verbalized understanding and agree with the plan.  Disposition: Status is: Inpatient Remains inpatient appropriate because: Needs acute inpatient rehab  Planned Discharge Destination: Acute inpatient rehab    Time spent: 40  minutes  Author: Read Camel, MD 04/29/2024 3:18 PM  For on call review [email or web address].

## 2024-04-29 NOTE — Evaluation (Signed)
 Physical Therapy Evaluation Patient Details Name: Anita Reilly MRN: [id number] DOB: January 05, 1946 Today's Date: 04/29/2024  History of Present Illness  Pt is an 78 y.o. female with PMHx of right face Bell's Palsy, HTN, HLD, RLS, GERD, Asthma and Depression who presented to the ED as a code stroke with L sided facial droop and hemiparesis s/p tNK. MRI negative.  Clinical Impression  Pt is a 78 year old F admitted to hospital on 04/27/24 for stroke like symptoms. At baseline, pt is IND with ADL's, IADL's, driving, and ambulation without AD.   Pt presents with L sided weakness, ataxic movement, decreased gross balance, decreased activity tolerance, decreased insight into deficits, and delayed processing, resulting in impaired functional mobility from baseline. Pt received sitting EOB with OT; has c/o generally feeling unwell since seated EOB. Due to deficits, pt required min assist +2 for transfers and to ambulate a total of 78ft in room with RW. Increased unsteadiness noted with both seated and standing balance, with pt demonstrating posterior bias which increases her risk of falls. Vestibular screen negative with saccades, smooth pursuits, and bilateral dix-hallpike. She may benefit from follow up with head impulse test to assess for VOR hypofunction if dizziness persists.  Deficits limit the pt's ability to safely and independently perform ADL's, transfer, and ambulate. Pt will benefit from acute skilled PT services to address deficits for return to baseline function. Pt will benefit from post acute therapy services to address deficits for return to baseline function.         If plan is discharge home, recommend the following: A lot of help with walking and/or transfers;A little help with bathing/dressing/bathroom;Assistance with cooking/housework;Assist for transportation;Help with stairs or ramp for entrance   Can travel by private vehicle        Equipment Recommendations  (defer to post  acute)  Recommendations for Other Services  Rehab consult    Functional Status Assessment Patient has had a recent decline in their functional status and demonstrates the ability to make significant improvements in function in a reasonable and predictable amount of time.     Precautions / Restrictions Precautions Precautions: Fall Restrictions Weight Bearing Restrictions Per Provider Order: No      Mobility  Bed Mobility               General bed mobility comments: seated EOB with OT upon entry    Transfers Overall transfer level: Needs assistance Equipment used: Rolling walker (2 wheels) Transfers: Sit to/from Stand Sit to Stand: Min assist, +2 safety/equipment           General transfer comment: Min assist +2 for power to stand with bilateral foot blocking with RW; verbal cues for safety, sequencing, and hand placement. Increased difficulty with coordinated movement to stand. Min assist +2 for controlled descent to sit EOB and in recliner with RW; verbal cues for safety, sequencing, and hand placement.    Ambulation/Gait Ambulation/Gait assistance: Min assist, +2 safety/equipment Gait Distance (Feet): 5 Feet (multiple lateral steps towards HOB; 94ft x1 EOB>recliner) Assistive device: Rolling walker (2 wheels)         General Gait Details: Min assist +2 for safety to ambulate short distance with RW. Demonstrates narrow BOS, ataxic LE movement, decreased step length/foot clearance bilaterally, and decreased standing balance especially during SLS phase of gait. Multimodal cues for safety, sequencing, RW proximity.     Balance Overall balance assessment: Needs assistance   Sitting balance-Leahy Scale: Fair Sitting balance - Comments: increased posterior bias with  fatigue, requiring cueing for correct posture; able to maintain with BUE support on RW     Standing balance-Leahy Scale: Fair Standing balance comment: increased posterior bias in standing requiring  increased assist and cues for anterior weight shift                             Pertinent Vitals/Pain Pain Assessment Pain Assessment: No/denies pain    Home Living Family/patient expects to be discharged to:: Private residence Living Arrangements: Alone Available Help at Discharge: Family;Available PRN/intermittently Type of Home: House Home Access: Stairs to enter;Ramped entrance Entrance Stairs-Rails: None Entrance Stairs-Number of Steps: 1 STE after ramp Alternate Level Stairs-Number of Steps: 6, landing and 6+ Home Layout: Two level;Able to live on main level with bedroom/bathroom        Prior Function Prior Level of Function : Driving;Independent/Modified Independent             Mobility Comments: independent ADLs Comments: independent including driving     Extremity/Trunk Assessment   Upper Extremity Assessment Upper Extremity Assessment: Defer to OT evaluation    Lower Extremity Assessment Lower Extremity Assessment: Generalized weakness;LLE deficits/detail (observed to be at least 3+/5 bilaterally as pt is able to WB through LE. Ataxic movement on LLE with decreased L stance time during gait indicating potential LLE weakness) LLE Deficits / Details: difficulty with sustained motor control during open chain movements LLE Coordination: decreased gross motor    Cervical / Trunk Assessment Cervical / Trunk Assessment: Normal  Communication   Communication Communication: No apparent difficulties Factors Affecting Communication:  (baseline Bell's Palsy)    Cognition Arousal: Alert Behavior During Therapy: WFL for tasks assessed/performed                           PT - Cognition Comments: delayed processing requiring increased time/effort for 2-step commands Following commands: Intact       Cueing Cueing Techniques: Verbal cues     General Comments General comments (skin integrity, edema, etc.): Vestibular screen performed due to  c/o spinning dizziness, nausea, and hx of vertigo. Smooth pursuits, saccades, and bilateral Dix-Hallpike negative. May benefit from head impulse test to assess for VOR hypofunction if symptoms persist.    Exercises Other Exercises Other Exercises: Pt and family educated re: PT role/POC, DC recommendations (benefits of post acute rehab placement vs HH services based on visit number and neuroplasticity for gained independence), safety with functional mobility, vestibular screen, OOB to chair, call for help. They verbalized understanding.   Assessment/Plan    PT Assessment Patient needs continued PT services  PT Problem List Decreased strength;Decreased activity tolerance;Decreased balance;Decreased mobility;Decreased coordination;Decreased cognition;Decreased safety awareness       PT Treatment Interventions DME instruction;Gait training;Stair training;Functional mobility training;Therapeutic activities;Therapeutic exercise;Balance training;Neuromuscular re-education;Patient/family education    PT Goals (Current goals can be found in the Care Plan section)  Acute Rehab PT Goals Patient Stated Goal: "get better" PT Goal Formulation: With patient/family Time For Goal Achievement: 05/13/24 Potential to Achieve Goals: Good    Frequency Min 3X/week     Co-evaluation PT/OT/SLP Co-Evaluation/Treatment: Yes Reason for Co-Treatment: Complexity of the patient's impairments (multi-system involvement);For patient/therapist safety PT goals addressed during session: Mobility/safety with mobility;Balance OT goals addressed during session: ADL's and self-care       AM-PAC PT "6 Clicks" Mobility  Outcome Measure Help needed turning from your back to your side while in a flat bed  without using bedrails?: A Little Help needed moving from lying on your back to sitting on the side of a flat bed without using bedrails?: A Little Help needed moving to and from a bed to a chair (including a wheelchair)?:  A Little Help needed standing up from a chair using your arms (e.g., wheelchair or bedside chair)?: A Little Help needed to walk in hospital room?: A Little Help needed climbing 3-5 steps with a railing? : A Lot 6 Click Score: 17    End of Session Equipment Utilized During Treatment: Gait belt Activity Tolerance: Patient tolerated treatment well Patient left: in chair;with call bell/phone within reach;with chair alarm set;with family/visitor present Nurse Communication: Mobility status PT Visit Diagnosis: Unsteadiness on feet (R26.81);Muscle weakness (generalized) (M62.81);History of falling (Z91.81);Ataxic gait (R26.0)    Time: 1020-1048 PT Time Calculation (min) (ACUTE ONLY): 28 min   Charges:   PT Evaluation $PT Eval Moderate Complexity: 1 Mod PT Treatments $Therapeutic Activity: 8-22 mins PT General Charges $$ ACUTE PT VISIT: 1 Visit        Branda Cain, PT, DPT 12:14 PM,04/29/24 Physical Therapist - [facility]

## 2024-04-29 NOTE — Evaluation (Signed)
 Occupational Therapy Evaluation Patient Details Name: Anita Reilly MRN: [id number] DOB: Jul 23, 1946 Today's Date: 04/29/2024   History of Present Illness   Pt is an 78 y.o. female with PMHx of right face Bell's Palsy, HTN, HLD, RLS, GERD, Asthma and Depression who presented to the ED as a code stroke with L sided facial droop and hemiparesis s/p tNK. MRI negative. Per neurology note "likely stuttering lacunar MRI negative brainstem stroke".     Clinical Impressions Pt admitted with above diagnosis. PTA, pt was independent with ADLs/IADLs/driving and mobility; pt lives alone. Overall, pt presents with L sided weakness with ataxic movement greater in LLE vs LUE, decreased processing, lacks insight into deficits and has decreased balance with mild posterior lean both in sitting and standing. Pt requires MIN A +1 for bed mobility, reports feeling generally unwell and slightly dizzy once seated EOB. VSS. Denies changes to vision, reports LLE feeling "weak". Pt currently requires MAX A to doff socks and don shoes seated EOB, anticipate up to MAX A for LB ADLs, and was able to perform functional STS transfers using RW with MIN A +2 with bilateral foot block, step pivot performed with RW and MIN A +2 to transfer from bed to recliner. Pt would benefit from skilled OT services to address noted impairments and functional limitations (see below for any additional details) in order to maximize safety and independence while minimizing falls risk and caregiver burden. Anticipate the need for follow up OT services upon acute hospital DC. Patient will benefit from intensive inpatient follow-up therapy, >3 hours/day.      If plan is discharge home, recommend the following:   Two people to help with walking and/or transfers;A lot of help with bathing/dressing/bathroom;Assistance with cooking/housework;Assist for transportation;Help with stairs or ramp for entrance     Functional Status Assessment   Patient  has had a recent decline in their functional status and demonstrates the ability to make significant improvements in function in a reasonable and predictable amount of time.     Equipment Recommendations   None recommended by OT (defer to next LOC)     Recommendations for Other Services   Rehab consult     Precautions/Restrictions   Precautions Precautions: Fall Precaution/Restrictions Comments: L sided ataxia Restrictions Weight Bearing Restrictions Per Provider Order: No     Mobility Bed Mobility Overal bed mobility: Needs Assistance Bed Mobility: Supine to Sit     Supine to sit: Min assist, HOB elevated, Used rails     General bed mobility comments: increased time, use of bed features    Transfers Overall transfer level: Needs assistance Equipment used: Rolling walker (2 wheels) Transfers: Sit to/from Stand Sit to Stand: Min assist, +2 safety/equipment           General transfer comment: requires bilateral foot blocking, pt with decreased L sided coordination, cues for hand placement and edu for AD use as pt does not use RW at baseline. Required RW support in standing, is able to take lateral steps towards [facility] with MIN A +2, decreased step clearance with LLE, +2 for safety to transfer with step pivot to recliner      Balance Overall balance assessment: Needs assistance Sitting-balance support: Feet supported, No upper extremity supported Sitting balance-Leahy Scale: Fair Sitting balance - Comments: increased posterior bias with fatigue, requiring cueing for correct posture; able to maintain with BUE support on RW     Standing balance-Leahy Scale: Fair Standing balance comment: increased posterior bias in standing requiring increased assist  and cues for anterior weight shift                           ADL either performed or assessed with clinical judgement   ADL Overall ADL's : Needs assistance/impaired Eating/Feeding: Sitting;Set up    Grooming: Sitting;Set up   Upper Body Bathing: Sitting;Minimal assistance   Lower Body Bathing: Sit to/from stand;Sitting/lateral leans;Maximal assistance   Upper Body Dressing : Sitting;Contact guard assist   Lower Body Dressing: Sit to/from stand;Sitting/lateral leans;Maximal assistance Lower Body Dressing Details (indicate cue type and reason): maxA to doff socks and on shoes Toilet Transfer: +2 for physical assistance;+2 for safety/equipment;Minimal assistance;BSC/3in1;Rolling walker (2 wheels) Toilet Transfer Details (indicate cue type and reason): clinical judgement bed > recliner. anticipate pt will require BSC for safe transfers until LLE axaxia improves Toileting- Clothing Manipulation and Hygiene: Maximal assistance;Sit to/from stand       Functional mobility during ADLs: Cueing for safety;Cueing for sequencing;+2 for physical assistance;Minimal assistance General ADL Comments: +2 MIN A with RW to take lateral sidesteps towards HOB, +2 for step pivot to recliner using RW.     Vision Baseline Vision/History: 1 Wears glasses Ability to See in Adequate Light: 0 Adequate Patient Visual Report: No change from baseline Vision Assessment?: Wears glasses for reading            Pertinent Vitals/Pain Pain Assessment Pain Assessment: No/denies pain     Extremity/Trunk Assessment Upper Extremity Assessment Upper Extremity Assessment: Right hand dominant;RUE deficits/detail;Generalized weakness;LUE deficits/detail RUE Deficits / Details: 4/5 grossly (limited ROM due to IV site discomfort) LUE Deficits / Details: sensation WFL, strength 3+/5 MMT LUE Sensation: decreased proprioception LUE Coordination: decreased gross motor   Lower Extremity Assessment Lower Extremity Assessment: Defer to PT evaluation LLE Deficits / Details: difficulty with sustained motor control during open chain movements LLE Coordination: decreased gross motor   Cervical / Trunk Assessment Cervical /  Trunk Assessment: Normal   Communication Communication Communication: No apparent difficulties Factors Affecting Communication:  (baseline Bells Palsy)   Cognition Arousal: Alert Behavior During Therapy: Anxious Cognition: Difficult to assess             OT - Cognition Comments: pt anxious, occassionally requires additional time for processing                 Following commands: Intact       Cueing  General Comments   Cueing Techniques: Verbal cues  VSS   Exercises Exercises: Other exercises Other Exercises Other Exercises: Vestibular screen with PT Other Exercises: Pt and family edu on role of OT in acute setting, discharge recommendations, use of call bell and therapy interventions   Shoulder Instructions      Home Living Family/patient expects to be discharged to:: Private residence Living Arrangements: Alone Available Help at Discharge: Family;Available PRN/intermittently Type of Home: House Home Access: Stairs to enter;Ramped entrance Entrance Stairs-Number of Steps: 1 STE after ramp Entrance Stairs-Rails: None Home Layout: Two level;Able to live on main level with bedroom/bathroom Alternate Level Stairs-Number of Steps: 6, landing and 6+ Alternate Level Stairs-Rails: Right Bathroom Shower/Tub: Tub/shower unit;Walk-in shower   Bathroom Toilet: Standard     Home Equipment: None          Prior Functioning/Environment Prior Level of Function : Driving;Independent/Modified Independent             Mobility Comments: independent ADLs Comments: independent including driving    OT Problem List: Decreased strength;Decreased range of  motion;Decreased activity tolerance;Impaired balance (sitting and/or standing);Decreased coordination;Decreased knowledge of precautions;Decreased knowledge of use of DME or AE   OT Treatment/Interventions: Self-care/ADL training;Therapeutic exercise;Neuromuscular education;DME and/or AE instruction;Therapeutic  activities;Patient/family education;Balance training      OT Goals(Current goals can be found in the care plan section)   Acute Rehab OT Goals Patient Stated Goal: to get better OT Goal Formulation: With patient/family Time For Goal Achievement: 05/13/24 Potential to Achieve Goals: Good ADL Goals Pt Will Perform Grooming: standing;with contact guard assist Pt Will Perform Upper Body Dressing: with contact guard assist;sitting Pt Will Perform Lower Body Dressing: sitting/lateral leans;sit to/from stand;with contact guard assist Pt Will Transfer to Toilet: with contact guard assist;ambulating Pt Will Perform Toileting - Clothing Manipulation and hygiene: with min assist;sit to/from stand;sitting/lateral leans   OT Frequency:  Min 3X/week    Co-evaluation PT/OT/SLP Co-Evaluation/Treatment: Yes Reason for Co-Treatment: Complexity of the patient's impairments (multi-system involvement);For patient/therapist safety PT goals addressed during session: Mobility/safety with mobility;Balance OT goals addressed during session: ADL's and self-care      AM-PAC OT "6 Clicks" Daily Activity     Outcome Measure Help from another person eating meals?: None Help from another person taking care of personal grooming?: None Help from another person toileting, which includes using toliet, bedpan, or urinal?: A Lot Help from another person bathing (including washing, rinsing, drying)?: A Lot Help from another person to put on and taking off regular upper body clothing?: A Lot Help from another person to put on and taking off regular lower body clothing?: A Lot 6 Click Score: 16   End of Session Equipment Utilized During Treatment: Rolling walker (2 wheels);Gait belt Nurse Communication: Mobility status  Activity Tolerance: Patient tolerated treatment well Patient left: in chair;with call bell/phone within reach;with bed alarm set;with family/visitor present  OT Visit Diagnosis: Other symptoms and  signs involving the nervous system (R29.898);Other abnormalities of gait and mobility (R26.89);Muscle weakness (generalized) (M62.81);Unsteadiness on feet (R26.81);Ataxia, unspecified (R27.0)                Time: 8119-1478 OT Time Calculation (min): 58 min Charges:  OT General Charges $OT Visit: 1 Visit OT Evaluation $OT Eval Moderate Complexity: 1 Mod OT Treatments $Self Care/Home Management : 8-22 mins  Alacia Rehmann L. Samiel Peel, OTR/L  04/29/24, 2:01 PM

## 2024-04-29 NOTE — Progress Notes (Signed)
 NEUROLOGY CONSULT FOLLOW UP NOTE   Date of service: April 29, 2024 Patient Name: Anita Reilly MRN:  [id number] DOB:  1945-12-08  Interval Hx/subjective   Still with mild symptoms  Vitals   Vitals:   04/29/24 0600 04/29/24 0630 04/29/24 0637 04/29/24 1037  BP: 122/86 (!) 154/83 (!) 154/83 (!) 145/85  Pulse: 73 82    Resp: 16 (!) 23    Temp:      TempSrc:      SpO2: 95% 94%    Weight:      Height:         Body mass index is 30.12 kg/m.  Physical Exam   Constitutional: Appears well-developed and well-nourished.  Psych: Affect appropriate to situation, mildly anxious, pleasant, cooperative Eyes: No scleral injection HENT: No oropharyngeal obstruction.  MSK: no major joint deformities.  Cardiovascular: Normal rate and regular rhythm. Perfusing extremities well Respiratory: Effort normal, non-labored breathing GI: Soft.  No distension. There is no tenderness.  Skin: Warm dry and intact visible skin  Neurologic Examination   Mental Status: Patient is awake, alert, oriented to person, place, month, year, and situation. Patient is able to give a clear and coherent history. No signs of aphasia or neglect Cranial Nerves: II: Visual Fields are full. Pupils are equal, round, and reactive to light.   III,IV, VI: EOMI without ptosis or diploplia.  V: Facial sensation is symmetric to temperature VII: Facial movement is notable for left facial weakness VIII: hearing is intact to voice X: Uvula reduced elevation on the right  XI: Shoulder shrug is symmetric. XII: tongue is midline without atrophy or fasciculations.  Motor: Tone is normal. Bulk is normal. 5/5 on the right, 4+/5 in the LUE, 4/5 in th eLLE Sensory: Sensation is symmetric on the face and arms but slightly reduced in the left leg  Cerebellar: FNF and HKS are intact bilaterally Gait:  Deferred in acute setting   Medications  Current Facility-Administered Medications:    acetaminophen  (TYLENOL ) tablet  650 mg, 650 mg, Oral, Q4H PRN, 650 mg at 04/28/24 1706 **OR** acetaminophen  (TYLENOL ) 160 MG/5ML solution 650 mg, 650 mg, Per Tube, Q4H PRN **OR** acetaminophen  (TYLENOL ) suppository 650 mg, 650 mg, Rectal, Q4H PRN, Ouma, Phoebe Breed, NP   atorvastatin (LIPITOR) tablet 80 mg, 80 mg, Oral, QHS, Bhagat, Srishti L, MD, 80 mg at 04/28/24 2115   Chlorhexidine Gluconate Cloth 2 % PADS 6 each, 6 each, Topical, Daily, Kasa, Kurian, MD, 6 each at 04/28/24 2115   Oral care mouth rinse, 15 mL, Mouth Rinse, PRN, Kasa, Kurian, MD   pantoprazole (PROTONIX) injection 40 mg, 40 mg, Intravenous, QHS, Ouma, Phoebe Breed, NP, 40 mg at 04/28/24 2115   senna-docusate (Senokot-S) tablet 1 tablet, 1 tablet, Oral, QHS PRN, Ouma, Elizabeth Achieng, NP   sodium chloride flush (NS) 0.9 % injection 3 mL, 3 mL, Intravenous, Once, Bradler, Evan K, MD   sodium chloride flush (NS) 0.9 % injection 3-10 mL, 3-10 mL, Intravenous, Q12H, Ouma, Phoebe Breed, NP, 3 mL at 04/28/24 2116   sodium chloride flush (NS) 0.9 % injection 3-10 mL, 3-10 mL, Intravenous, PRN, Ouma, Elizabeth Achieng, NP   traZODone (DESYREL) tablet 50 mg, 50 mg, Oral, QHS PRN, Gideon Kussmaul, NP  Labs and Diagnostic Imaging   CBC:  Recent Labs  Lab 04/27/24 2140 04/29/24 0222  WBC 7.9 6.0  NEUTROABS 4.5 2.6  HGB 14.3 13.6  HCT 41.6 40.2  MCV 91.6 92.4  PLT 289 255    Basic Metabolic  Panel:  Lab Results  Component Value Date   NA 136 04/29/2024   K 4.5 04/29/2024   CO2 27 04/29/2024   GLUCOSE 110 (H) 04/29/2024   BUN 20 04/29/2024   CREATININE 1.06 (H) 04/29/2024   CALCIUM 9.2 04/29/2024   GFRNONAA 53 (L) 04/29/2024   GFRAA >90 08/16/2012   Lipid Panel:  Lab Results  Component Value Date   LDLCALC 179 (H) 04/28/2024    HgbA1c:  Lab Results  Component Value Date   HGBA1C 6.2 (H) 04/27/2024    Alcohol Level     Component Value Date/Time   [facility] <15 04/27/2024 2140   INR  Lab Results  Component Value Date    INR 1.0 04/27/2024   APTT  Lab Results  Component Value Date   APTT 26 04/27/2024    CT Head without contrast(report reviewed): 1. No CT evidence of acute intracranial abnormality. 2. ASPECTS is 10  CT angio Head and Neck with contrast with perfusion (Personally reviewed): 1. No emergent large vessel occlusion or high-grade stenosis of the intracranial arteries. 2. Normal CT perfusion.  MRI Brain(Personally reviewed): 1. No acute intracranial abnormality. 2. Chronic microangiopathic white matter changes.  Repeat CT - negative.     ECHO:  No intracardiac source of embolism detected  Assessment   Anita Reilly is a 78 y.o. female with a past medical history significant for right Bell's palsy (2014), hypertension, hyperlipidemia, prediabetes, BMI 30, who presented with sudden onset left-sided weakness after syncopal episode while attending a wedding.  Initially her symptoms were rapidly improving however recurred and therefore TNK was administered due to disabling symptoms.  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   She tried simvastatin in the past and had side effects therefore I would favor starting with 40mg  rather than 80mg  of lipitor. If she continues to not tolerate statins, would need to consider PCSK9 inhibitor.   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 She reports history of GI upset, and therefore after 3 weeks, would continue plavix monotherapy.  - Telemetry monitoring while inpatient - Blood pressure goal   - gradually normalize over 2-4 days  - PT consult, OT consult, Speech consult - Neurology will be available as needed. Please call with further   ______________________________________________________________________   Ann Keto, MD [facility]  If 7pm- 7am, please page neurology on call as listed in [facility]ION.

## 2024-04-30 DIAGNOSIS — I639 Cerebral infarction, unspecified: Secondary | ICD-10-CM | POA: Diagnosis not present

## 2024-04-30 MED ORDER — LOSARTAN POTASSIUM 50 MG PO TABS
50.0000 mg | ORAL_TABLET | Freq: Every day | ORAL | Status: DC
  Administered 2024-04-30 – 2024-05-03 (×4): 50 mg via ORAL
  Filled 2024-04-30 (×4): qty 1

## 2024-04-30 MED ORDER — PREGABALIN 25 MG PO CAPS
25.0000 mg | ORAL_CAPSULE | Freq: Two times a day (BID) | ORAL | Status: DC
  Administered 2024-04-30 – 2024-05-03 (×7): 25 mg via ORAL
  Filled 2024-04-30 (×7): qty 1

## 2024-04-30 NOTE — Progress Notes (Signed)
 Progress Note   Patient: Anita Blazina Keltz ZOX:[id number] DOB: 05-18-1946 DOA: 04/27/2024     3 DOS: the patient was seen and examined on 04/30/2024   Brief hospital course: Donnis Pecha is an 78 y.o. female with PMHx of right face Bell's Palsy, HTN, HLD, RLS, GERD, Asthma and Depression who presented to the ED as a code stroke.   Per EMS runsheet, patient was at a wedding when she c/o feeling very hot and went to sit down. A bystander noticed she was almost unresponsive and called 911. When EMS arrive, pt was sitting upright on the curb alongside FD, MED1, and wedding attendees. Pt was a & ox4 with clammy skin however bystanders advised they had poured water on her in attempts to alleviate her symptom(s).  Initial stroke screen performed noted total loss of motor function in her left leg as well as left facial droop with an LVO score of 2 and a " last known well" of 20:15. Pt denied taking blood thinners or known cardiac hx. Pt was able to stand and pivot to lay on stretcher where she was secured with straps and rails. pt' s left leg deficit had resolved and she had regained normal motor function however she still had residual left- sided facial droop throughout transport. Pt does have reported hx of Bell' s Palsy affecting the right- side of her face. " Code Stroke" activated en route to the ED.    ED Course: Initial vital signs showed HR of 77 beats/minute, BP 123/80 mm Hg, the RR 18 breaths/minute, and the oxygen saturation 94% on RA  and a temperature of 98.50F (36.0C). CODE stroke was initiated and patient evaluated by a teleneurologist. A stat CTH and CTA head obtained did not reveal any large vessel occlusion and for that reason the patient was not considered a thrombectomy candidate  however she deemed her a candidate for TNK.   Pertinent Labs/Diagnostics Findings: Glucose:145 BUN/Cr.:28/1.61 WBC: unremarkable Date last known well: 04/27/24 Time last known well: Time:  21:15 Initial NIHSS:6 TNK Given: Yes @22 :37       Assessment and Plan:  Acute CVA  ??  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 Appreciate PT input, they recommend acute inpatient rehab       Hypertension Resume Losartan     Right facial paralysis (Bell's palsy) Continue Lyrica     AKI Improved Serum creatinine on admission was 1.60 and is back to baseline of 1.06        Subjective: Sitting up in a recliner.  No new complaints  Physical Exam: Vitals:   04/30/24 0743 04/30/24 1028 04/30/24 1030 04/30/24 1037  BP: (!) 140/78 (!) 140/78 (!) 140/78 137/76  Pulse: 80   92  Resp: 17   17  Temp: 98.7 F (37.1 C)   98.3 F (36.8 C)  TempSrc:    Oral  SpO2: 99%   100%  Weight:      Height:       General: Not acutely ill appearing, NAD on 2L O2 via nasal canula HENT: Supple, no JVD  Lungs: Clear throughout, even, non labored  Cardiovascular: NSR, s1s2, no m/r/g, 2+ radial/2+ distal pulses, no edema  Abdomen: +BS x4, soft, non tender, non distended  Extremities: Left-sided  upper/lower extremity weakness, left facial droop  Neuro: Alert and oriented, follows commands, PERRLA, left-sided upper/lower extremity weakness, left facial droop  GU: Voiding    Data Reviewed: Hemoglobin A1c 6.2 Labs reviewed  Family Communication: Plan of care discussed with patient at the bedside.  Awaiting discharge to inpatient rehab  Disposition: Status is: Inpatient Remains inpatient appropriate because:   Planned Discharge Destination: Acute inpatient rehab    Time spent: 35 minutes  Author: Read Camel, MD 04/30/2024 12:47 PM  For on call review [email or web address].

## 2024-04-30 NOTE — Progress Notes (Signed)
 Inpatient Rehab Coordinator Note:  I spoke with pt and her son, Myrtie Atkinson, over the phone to discuss CIR recommendations and goals/expectations of CIR stay.  We reviewed 3 hrs/day of therapy, physician follow up, and average length of stay 2 weeks (dependent upon progress) with goals of supervision.  Son confirms family can provide 24/7 support at discharge between multiple family members.  Home is one level set up with ramp.  We reviewed need for prior auth and I will start that request today.   Loye Rumble, PT, DPT Admissions Coordinator [phone] 04/30/24  2:45 PM

## 2024-04-30 NOTE — PMR Pre-admission (Signed)
 PMR Admission Coordinator Pre-Admission Assessment  Patient: Anita Reilly is an 78 y.o., female MRN: [id number] DOB: [birth year] Height: 5' 4 (162.6 cm) Weight: 79.6 kg  Insurance Information HMO:     PPO: yes     PCP:      IPA:      80/20:      OTHER:  PRIMARY: U[employer] Medicare      Policy#: [id number]       Subscriber: pt CM Name: Genene Kennel      Phone#: [phone]     Fax#: [phone] Pre-Cert#: [id number]  auth for CIR from [facility] with [facility] Medicare with updates due to fax listed above on 6/19      Employer:  Benefits:  Phone #: [phone]     Name:  Eff. Date: 11/22/23     Deduct: $0      Out of Pocket Max: $750 ($5 met)      Life Max:  CIR: $250/admit      SNF: 20 full days Outpatient:      Co-Pay: $20/visit Home Health: 100%      Co-Pay:  DME:      Co-Pay: $20-$30/ each medicare covered benefit Providers:  SECONDARY:       Policy#:      Phone#:   Artist:       Phone#:   The "Data Collection Information Summary" for patients in Inpatient Rehabilitation Facilities with attached "Privacy Act Statement-Health Care Records" was provided and verbally reviewed with: Patient and Family  Emergency Contact Information Contact Information   None on File    Other Contacts     Name Relation Home Work Mobile   Decker Son [phone]  [phone]   Reagan Camera Daughter [phone]  [phone]   Aishwarya, [place] Daughter   [phone]       Current Medical History  Patient Admitting Diagnosis: CVA  History of Present Illness: Pt is a 78 y/o female with PMH of Bell's Palsy, HTN, HLD, GERD, asthma who presented to [facility] on 04/27/24 with stroke like symptoms while at a wedding.  Pt reports possible syncopal episode, but not sure.  In ED, vitals WNL, pt with ongoing L hemiparesis, labs unremarkable.  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  She did receive TNK.  Stroke workup was completed and neurology recommended aspirin  and plavix  x 3 weeks, then plavix  alone.  Therapy evaluations were completed and pt was recommended for CIR.    Complete NIHSS TOTAL: 1  Patient's medical record from [facility] has been reviewed by the rehabilitation admission coordinator and physician.  Past Medical History  Past Medical History:  Diagnosis Date   Arthritis    shoulders   Bell's palsy 2016   Vertigo    none in several years    Has the patient had major surgery during 100 days prior to admission? No  Family History   family history is not on file.  Current Medications  Current Facility-Administered Medications:    acetaminophen  (TYLENOL ) tablet 650 mg, 650 mg, Oral, Q4H PRN, 650 mg at 05/01/24 1949 **OR** acetaminophen  (TYLENOL ) 160 MG/5ML solution 650 mg, 650 mg, Per Tube, Q4H PRN **OR** acetaminophen  (TYLENOL ) suppository 650 mg, 650 mg, Rectal, Q4H PRN, Ouma, Phoebe Breed, NP   aspirin  EC tablet 81 mg, 81 mg, Oral, Daily, Augustin Leber, MD, 81 mg at 05/03/24 0905   atorvastatin  (LIPITOR )  tablet 40 mg, 40 mg, Oral, QHS, Augustin Leber, MD, 40 mg at 05/02/24 2154   [COMPLETED] clopidogrel  (PLAVIX ) tablet 300 mg, 300 mg, Oral, Once, 300 mg at 04/29/24 1146 **AND** clopidogrel  (PLAVIX ) tablet 75 mg, 75 mg, Oral, Daily, Augustin Leber, MD, 75 mg at 05/03/24 1610   losartan  (COZAAR ) tablet 50 mg, 50 mg, Oral, Daily, Agbata, Tochukwu, MD, 50 mg at 05/03/24 0906   Oral care mouth rinse, 15 mL, Mouth Rinse, PRN, Kasa, Kurian, MD   pantoprazole  (PROTONIX ) EC tablet 40 mg, 40 mg, Oral, Daily, Nazari, Walid A, RPH, 40 mg at 05/03/24 0905   polyethylene glycol (MIRALAX  / GLYCOLAX ) packet 17 g, 17 g, Oral, BID, Dezii, Alexandra, DO, 17 g at 05/03/24 0907   pregabalin  (LYRICA ) capsule 25 mg, 25 mg, Oral, BID, Agbata, Tochukwu, MD, 25 mg at 05/03/24 0907   senna-docusate (Senokot-S) tablet 1 tablet, 1  tablet, Oral, QHS PRN, Ouma, Elizabeth Achieng, NP, 1 tablet at 05/01/24 2239   sodium chloride  flush (NS) 0.9 % injection 3 mL, 3 mL, Intravenous, Once, Bradler, Evan K, MD   sodium chloride  flush (NS) 0.9 % injection 3-10 mL, 3-10 mL, Intravenous, Q12H, Ouma, Phoebe Breed, NP, 5 mL at 05/03/24 0907   sodium chloride  flush (NS) 0.9 % injection 3-10 mL, 3-10 mL, Intravenous, PRN, Ouma, Phoebe Breed, NP   traZODone  (DESYREL ) tablet 50 mg, 50 mg, Oral, QHS PRN, Ouma, Phoebe Breed, NP  Patients Current Diet:  Diet Order             Diet regular Room service appropriate? Yes with Assist; Fluid consistency: Thin  Diet effective now                   Precautions / Restrictions Precautions Precautions: Fall Precaution/Restrictions Comments: L sided ataxia Restrictions Weight Bearing Restrictions Per Provider Order: No   Has the patient had 2 or more falls or a fall with injury in the past year? No  Prior Activity Level Community (5-7x/wk): fully independent, no DME, driving, managing her home/yard without assist  Prior Functional Level Self Care: Did the patient need help bathing, dressing, using the toilet or eating? Independent  Indoor Mobility: Did the patient need assistance with walking from room to room (with or without device)? Independent  Stairs: Did the patient need assistance with internal or external stairs (with or without device)? Independent  Functional Cognition: Did the patient need help planning regular tasks such as shopping or remembering to take medications? Independent  Patient Information Are you of Hispanic, Latino/a,or Spanish origin?: A. No, not of Hispanic, Latino/a, or Spanish origin What is your race?: A. White Do you need or want an interpreter to communicate with a doctor or health care staff?: 0. No  Patient's Response To:  Health Literacy and Transportation Is the patient able to respond to health literacy and transportation  needs?: Yes Health Literacy - How often do you need to have someone help you when you read instructions, pamphlets, or other written material from your doctor or pharmacy?: Never In the past 12 months, has lack of transportation kept you from medical appointments or from getting medications?: No In the past 12 months, has lack of transportation kept you from meetings, work, or from getting things needed for daily living?: No  Home Assistive Devices / Equipment Home Equipment: None  Prior Device Use: Indicate devices/aids used by the patient prior to current illness, exacerbation or injury? None of the above  Current Functional Level Cognition  Orientation Level: Oriented X4    Extremity Assessment (includes Sensation/Coordination)  Upper Extremity Assessment: Overall WFL for tasks assessed RUE Deficits / Details: 4/5 grossly (limited ROM due to IV site discomfort) LUE Deficits / Details: sensation WFL, strength 3+/5 MMT LUE Sensation: decreased proprioception LUE Coordination: decreased gross motor  Lower Extremity Assessment: Generalized weakness LLE Deficits / Details: difficulty with sustained motor control during open chain movements LLE Coordination: decreased gross motor    ADLs  Overall ADL's : Needs assistance/impaired Eating/Feeding: Sitting, Set up Grooming: Sitting, Set up Upper Body Bathing: Sitting, Minimal assistance Lower Body Bathing: Sit to/from stand, Sitting/lateral leans, Maximal assistance Upper Body Dressing : Sitting, Contact guard assist Lower Body Dressing: Sit to/from stand, Sitting/lateral leans, Maximal assistance Lower Body Dressing Details (indicate cue type and reason): maxA to doff socks and on shoes Toilet Transfer: +2 for physical assistance, +2 for safety/equipment, Minimal assistance, BSC/3in1, Rolling walker (2 wheels) Toilet Transfer Details (indicate cue type and reason): clinical judgement bed > recliner. anticipate pt will require BSC for  safe transfers until LLE axaxia improves Toileting- Clothing Manipulation and Hygiene: Maximal assistance, Sit to/from stand Functional mobility during ADLs: Cueing for safety, Cueing for sequencing, +2 for physical assistance, Minimal assistance General ADL Comments: MIN A + RW for ADL t/f ~30 ft, decreasing to MOD A  as pt fatgiues for ~30 ft - assist to facilitate L hip flexion without excessive R trunk lean. MAX A don/doff L sock in standing. SBA standing grooming tasks - 2 lateral LOBs, self corrects with UE support.    Mobility  Overal bed mobility: Modified Independent Bed Mobility: Supine to Sit Supine to sit: Supervision General bed mobility comments: Not tested-pt received in chair    Transfers  Overall transfer level: Needs assistance Equipment used: None Transfers: Sit to/from Stand Sit to Stand: Contact guard assist General transfer comment: VC's for pushing off bed before grabbing RW    Ambulation / Gait / Stairs / Wheelchair Mobility  Ambulation/Gait Ambulation/Gait assistance: Editor, commissioning (Feet): 200 Feet Assistive device: Rolling walker (2 wheels) Gait Pattern/deviations: Step-to pattern, Ataxic, Decreased stance time - left, Decreased step length - right General Gait Details: Heavy multi-modal cuing for proper gait sequencing with RW. Throughout gait activity, VC's were repeated for proper gait sequencing. Pt able to make effortful steps with LLE clearing ground with multiple VC's. Pt completed 10 foot walk test in 1 minute and 18.53 seconds. Pt ambulated 200 feet in 26 minutes. Gait velocity: 0.127 ft/sec Gait velocity interpretation: <1.31 ft/sec, indicative of household ambulator    Posture / Balance Dynamic Sitting Balance Sitting balance - Comments: increased posterior bias with fatigue, requiring cueing for correct posture; able to maintain with BUE support on RW Balance Overall balance assessment: Needs assistance Sitting-balance support: Feet  supported, Bilateral upper extremity supported Sitting balance-Leahy Scale: Good Sitting balance - Comments: increased posterior bias with fatigue, requiring cueing for correct posture; able to maintain with BUE support on RW Standing balance support: Single extremity supported, During functional activity Standing balance-Leahy Scale: Fair Standing balance comment: Occasional min A for stability in standing primarily to prevent posterior LOB    Special needs/care consideration Diabetic management A1C 6.2   Previous Home Environment (from acute therapy documentation) Living Arrangements: Alone Available Help at Discharge: Family, Available PRN/intermittently Type of Home: House Home Layout: Two level, Able to live on main level with bedroom/bathroom Alternate Level Stairs-Rails: Right Alternate Level Stairs-Number of Steps: 6, landing and 6+ Home Access: Stairs to  enter, Ramped entrance Entrance Stairs-Rails: None Entrance Stairs-Number of Steps: 1 STE after ramp Bathroom Shower/Tub: Tub/shower unit, Health visitor: Standard  Discharge Living Setting Plans for Discharge Living Setting: Patient's home, Lives with (comment) (family to stay with her) Type of Home at Discharge: House Discharge Home Layout: Able to live on main level with bedroom/bathroom Discharge Home Access: Ramped entrance Discharge Bathroom Shower/Tub: Tub/shower unit, Walk-in shower Discharge Bathroom Toilet: Standard Discharge Bathroom Accessibility: Yes How Accessible: Accessible via walker Does the patient have any problems obtaining your medications?: No  Social/Family/Support Systems Anticipated Caregiver: children, grand children, son, Myrtie Atkinson is primary contact Anticipated Caregiver's Contact Information: [phone] Ability/Limitations of Caregiver: none stated Caregiver Availability: 24/7 Discharge Plan Discussed with Primary Caregiver: Yes Is Caregiver In Agreement with Plan?:  Yes  Goals Patient/Family Goal for Rehab: PT/OT/SLP supervision Expected length of stay: 10-14 days Additional Information: Discharge plan: return to pt's home, family will stay with her to ensure 24/7 supervision Pt/Family Agrees to Admission and willing to participate: Yes Program Orientation Provided & Reviewed with Pt/Caregiver Including Roles  & Responsibilities: Yes  Decrease burden of Care through IP rehab admission: n/a  Possible need for SNF placement upon discharge: Not anticipated.  Plan to discharge back to pt's home and family will stay with her for 24/7 supervision at discharge from CIR.   Patient Condition: I have reviewed medical records from [facility], spoken with CM, and patient and son. I discussed via phone for inpatient rehabilitation assessment.  Patient will benefit from ongoing PT, OT, and SLP, can actively participate in 3 hours of therapy a day 5 days of the week, and can make measurable gains during the admission.  Patient will also benefit from the coordinated team approach during an Inpatient Acute Rehabilitation admission.  The patient will receive intensive therapy as well as Rehabilitation physician, nursing, social worker, and care management interventions.  Due to safety, skin/wound care, disease management, medication administration, pain management, and patient education the patient requires 24 hour a day rehabilitation nursing.  The patient is currently min assist with mobility and basic ADLs.  Discharge setting and therapy post discharge at home with home health is anticipated.  Patient has agreed to participate in the Acute Inpatient Rehabilitation Program and will admit today.  Preadmission Screen Completed By:  Caitlin E Warren,PT, DPT 05/03/2024 10:39 AM ______________________________________________________________________   Discussed status with Dr. Rachel Budds on 05/03/24  at 10:39 AM  and received approval for admission today.  Admission Coordinator:  Caitlin E  Warren, PT, DPT time 10:39 AM Alanna Hu 05/03/24    Assessment/Plan: Diagnosis: brainstem infarct Does the need for close, 24 hr/day Medical supervision in concert with the patient's rehab needs make it unreasonable for this patient to be served in a less intensive setting? Yes Co-Morbidities requiring supervision/potential complications: HTN, Bell's palsy, asthma, post-stroke sequelae Due to bladder management, bowel management, safety, skin/wound care, disease management, medication administration, pain management, and patient education, does the patient require 24 hr/day rehab nursing? Yes Does the patient require coordinated care of a physician, rehab nurse, PT, OT, and SLP to address physical and functional deficits in the context of the above medical diagnosis(es)? Yes Addressing deficits in the following areas: balance, endurance, locomotion, strength, transferring, bowel/bladder control, bathing, dressing, feeding, grooming, toileting, cognition, speech, and psychosocial support Can the patient actively participate in an intensive therapy program of at least 3 hrs of therapy 5 days a week? Yes The potential for patient to make measurable gains while on inpatient rehab  is excellent Anticipated functional outcomes upon discharge from inpatient rehab: supervision PT, supervision OT, supervision SLP Estimated rehab length of stay to reach the above functional goals is: 10-14 days Anticipated discharge destination: Home 10. Overall Rehab/Functional Prognosis: excellent   MD Signature: Rawland Caddy, MD, [facility] Physical Medicine & Rehabilitation Medical Director Rehabilitation Services 05/03/2024

## 2024-04-30 NOTE — Plan of Care (Signed)

## 2024-04-30 NOTE — Care Management Important Message (Signed)
 Important Message  Patient Details  Name: Anita Reilly MRN: [id number] Date of Birth: [birth year]   Important Message Given:  Yes - Medicare IM     Anise Kerns 04/30/2024, 4:33 PM

## 2024-04-30 NOTE — Progress Notes (Signed)
 Physical Therapy Treatment Patient Details Name: Anita Reilly MRN: [id number] DOB: 1945/12/23 Today's Date: 04/30/2024   History of Present Illness Pt is an 78 y.o. female with PMHx of right face Bell's Palsy, HTN, HLD, RLS, GERD, Asthma and Depression who presented to the ED as a code stroke with L sided facial droop and hemiparesis s/p tNK. MRI negative. Per neurology note "likely stuttering lacunar MRI negative brainstem stroke".    PT Comments  Pt was pleasant and motivated to participate during the session and put forth good effort throughout. Pt required significantly increased time, effort, and use of bed rails with bed mobility tasks but no physical assist.  Pt provided extensive training for proper sequencing with transfers with emphasis on increased trunk flexion and power during standing and increased trunk flexion and control while sitting.  Pt with notably decreased assistance required to come to standing by the end of the session compared to the onset of transfer training.  Pt ataxic on the LLE during gait training with heavy cuing provided for step-to sequencing and general safe use of the RW.  Heavy cuing given to transition from shuffling feet to clearing the floor during limb advancement with some gross improvement noted.  Pt will benefit from continued PT services upon discharge to safely address deficits listed in patient problem list for decreased caregiver assistance and eventual return to PLOF.      If plan is discharge home, recommend the following: A lot of help with walking and/or transfers;A little help with bathing/dressing/bathroom;Assistance with cooking/housework;Assist for transportation;Help with stairs or ramp for entrance   Can travel by private vehicle        Equipment Recommendations  Other (comment) (TBD at next venue of care)    Recommendations for Other Services       Precautions / Restrictions Precautions Precautions:  Fall Precaution/Restrictions Comments: L sided ataxia Restrictions Weight Bearing Restrictions Per Provider Order: No     Mobility  Bed Mobility Overal bed mobility: Needs Assistance Bed Mobility: Supine to Sit     Supine to sit: Supervision     General bed mobility comments: Mod extra time, effort, and use of the bed rails required    Transfers Overall transfer level: Needs assistance Equipment used: Rolling walker (2 wheels) Transfers: Sit to/from Stand Sit to Stand: Min assist           General transfer comment: Ataxic rocking and general instability at times during transfer training from multiple height surfaces with min A to come to standing as well as for stability; safety and form grossly improved to where pt required only CGA after extensive training for proper sequencing using multi-modal cuing    Ambulation/Gait Ambulation/Gait assistance: Min assist Gait Distance (Feet): 12 Feet x 1, 5 Feet x 1 Assistive device: Rolling walker (2 wheels) Gait Pattern/deviations: Step-to pattern, Ataxic, Decreased stance time - left, Decreased step length - right, Shuffle Gait velocity: decreased     General Gait Details: Heavy multi-modal cuing for step-to sequencing to support LLE with BUEs on RW.   Pt initially shuffled both feet during gait training but progressed to only occasional shuffling with heavy cues for sequencing; min A needed for stability and to control/advance the RW   Stairs             Wheelchair Mobility     Tilt Bed    Modified Rankin (Stroke Patients Only)       Balance Overall balance assessment: Needs assistance Sitting-balance support: Feet supported,  No upper extremity supported Sitting balance-Leahy Scale: Good     Standing balance support: Bilateral upper extremity supported, During functional activity Standing balance-Leahy Scale: Poor Standing balance comment: Occasional min A for stability in standing primarily to prevent  posterior LOB                            Communication Communication Communication: No apparent difficulties  Cognition Arousal: Alert Behavior During Therapy: WFL for tasks assessed/performed   PT - Cognitive impairments: No apparent impairments                         Following commands: Impaired Following commands impaired: Follows one step commands with increased time    Cueing Cueing Techniques: Verbal cues  Exercises Other Exercises Other Exercises: Sit to/from stand x 5 with cues for controlled eccentric phase Other Exercises: Standing mini-squats with low to mod amplitude x 5    General Comments        Pertinent Vitals/Pain Pain Assessment Pain Assessment: No/denies pain    Home Living                          Prior Function            PT Goals (current goals can now be found in the care plan section) Progress towards PT goals: Progressing toward goals    Frequency    Min 3X/week      PT Plan      Co-evaluation              AM-PAC PT "6 Clicks" Mobility   Outcome Measure  Help needed turning from your back to your side while in a flat bed without using bedrails?: A Little Help needed moving from lying on your back to sitting on the side of a flat bed without using bedrails?: A Little Help needed moving to and from a bed to a chair (including a wheelchair)?: A Little Help needed standing up from a chair using your arms (e.g., wheelchair or bedside chair)?: A Little Help needed to walk in hospital room?: A Little Help needed climbing 3-5 steps with a railing? : A Lot 6 Click Score: 17    End of Session Equipment Utilized During Treatment: Gait belt Activity Tolerance: Patient tolerated treatment well Patient left: in chair;with call bell/phone within reach;with chair alarm set;with family/visitor present Nurse Communication: Mobility status PT Visit Diagnosis: Unsteadiness on feet (R26.81);Muscle weakness  (generalized) (M62.81);History of falling (Z91.81);Ataxic gait (R26.0)     Time: 0981-1914 PT Time Calculation (min) (ACUTE ONLY): 29 min  Charges:    $Gait Training: 8-22 mins $Therapeutic Activity: 8-22 mins PT General Charges $$ ACUTE PT VISIT: 1 Visit                     D. Scott Marquist Binstock PT, DPT 04/30/24, 5:23 PM

## 2024-04-30 NOTE — Plan of Care (Signed)
 Problem: Education: Goal: Knowledge of disease or condition will improve 04/30/2024 1910 by Lourdes Roy, LPN Outcome: Progressing 04/30/2024 1909 by Lourdes Roy, LPN Outcome: Progressing Goal: Knowledge of secondary prevention will improve (MUST DOCUMENT ALL) 04/30/2024 1910 by Lourdes Roy, LPN Outcome: Progressing 04/30/2024 1909 by Lourdes Roy, LPN Outcome: Progressing Goal: Knowledge of patient specific risk factors will improve (DELETE if not current risk factor) 04/30/2024 1910 by Lourdes Roy, LPN Outcome: Progressing 04/30/2024 1909 by Lourdes Roy, LPN Outcome: Progressing   Problem: Ischemic Stroke/TIA Tissue Perfusion: Goal: Complications of ischemic stroke/TIA will be minimized 04/30/2024 1910 by Lourdes Roy, LPN Outcome: Progressing 04/30/2024 1909 by Lourdes Roy, LPN Outcome: Progressing   Problem: Coping: Goal: Will verbalize positive feelings about self 04/30/2024 1910 by Lourdes Roy, LPN Outcome: Progressing 04/30/2024 1909 by Lourdes Roy, LPN Outcome: Progressing Goal: Will identify appropriate support needs 04/30/2024 1910 by Lourdes Roy, LPN Outcome: Progressing 04/30/2024 1909 by Lourdes Roy, LPN Outcome: Progressing   Problem: Health Behavior/Discharge Planning: Goal: Ability to manage health-related needs will improve 04/30/2024 1910 by Lourdes Roy, LPN Outcome: Progressing 04/30/2024 1909 by Lourdes Roy, LPN Outcome: Progressing Goal: Goals will be collaboratively established with patient/family 04/30/2024 1910 by Lourdes Roy, LPN Outcome: Progressing 04/30/2024 1909 by Lourdes Roy, LPN Outcome: Progressing   Problem: Self-Care: Goal: Ability to participate in self-care as condition permits will improve 04/30/2024 1910 by Lourdes Roy, LPN Outcome: Progressing 04/30/2024 1909 by Lourdes Roy, LPN Outcome: Progressing Goal: Verbalization of feelings and concerns over difficulty with  self-care will improve 04/30/2024 1910 by Lourdes Roy, LPN Outcome: Progressing 04/30/2024 1909 by Lourdes Roy, LPN Outcome: Progressing Goal: Ability to communicate needs accurately will improve 04/30/2024 1910 by Lourdes Roy, LPN Outcome: Progressing 04/30/2024 1909 by Lourdes Roy, LPN Outcome: Progressing   Problem: Nutrition: Goal: Risk of aspiration will decrease 04/30/2024 1910 by Lourdes Roy, LPN Outcome: Progressing 04/30/2024 1909 by Lourdes Roy, LPN Outcome: Progressing Goal: Dietary intake will improve 04/30/2024 1910 by Lourdes Roy, LPN Outcome: Progressing 04/30/2024 1909 by Lourdes Roy, LPN Outcome: Progressing   Problem: Education: Goal: Knowledge of General Education information will improve Description: Including pain rating scale, medication(s)/side effects and non-pharmacologic comfort measures 04/30/2024 1910 by Lourdes Roy, LPN Outcome: Progressing 04/30/2024 1909 by Lourdes Roy, LPN Outcome: Progressing   Problem: Health Behavior/Discharge Planning: Goal: Ability to manage health-related needs will improve 04/30/2024 1910 by Lourdes Roy, LPN Outcome: Progressing 04/30/2024 1909 by Lourdes Roy, LPN Outcome: Progressing   Problem: Clinical Measurements: Goal: Ability to maintain clinical measurements within normal limits will improve 04/30/2024 1910 by Lourdes Roy, LPN Outcome: Progressing 04/30/2024 1909 by Lourdes Roy, LPN Outcome: Progressing Goal: Will remain free from infection 04/30/2024 1910 by Lourdes Roy, LPN Outcome: Progressing 04/30/2024 1909 by Lourdes Roy, LPN Outcome: Progressing Goal: Diagnostic test results will improve 04/30/2024 1910 by Lourdes Roy, LPN Outcome: Progressing 04/30/2024 1909 by Lourdes Roy, LPN Outcome: Progressing Goal: Respiratory complications will improve 04/30/2024 1910 by Lourdes Roy, LPN Outcome: Progressing 04/30/2024 1909 by Lourdes Roy,  LPN Outcome: Progressing Goal: Cardiovascular complication will be avoided 04/30/2024 1910 by Lourdes Roy, LPN Outcome: Progressing 04/30/2024 1909 by Lourdes Roy, LPN Outcome: Progressing   Problem: Activity: Goal: Risk for activity intolerance will decrease 04/30/2024 1910 by Lourdes Roy, LPN Outcome: Progressing 04/30/2024 1909 by  Lourdes Roy, LPN Outcome: Progressing   Problem: Nutrition: Goal: Adequate nutrition will be maintained 04/30/2024 1910 by Lourdes Roy, LPN Outcome: Progressing 04/30/2024 1909 by Lourdes Roy, LPN Outcome: Progressing   Problem: Coping: Goal: Level of anxiety will decrease 04/30/2024 1910 by Lourdes Roy, LPN Outcome: Progressing 04/30/2024 1909 by Lourdes Roy, LPN Outcome: Progressing   Problem: Elimination: Goal: Will not experience complications related to bowel motility 04/30/2024 1910 by Lourdes Roy, LPN Outcome: Progressing 04/30/2024 1909 by Lourdes Roy, LPN Outcome: Progressing Goal: Will not experience complications related to urinary retention 04/30/2024 1910 by Lourdes Roy, LPN Outcome: Progressing 04/30/2024 1909 by Lourdes Roy, LPN Outcome: Progressing   Problem: Pain Managment: Goal: General experience of comfort will improve and/or be controlled 04/30/2024 1910 by Lourdes Roy, LPN Outcome: Progressing 04/30/2024 1909 by Lourdes Roy, LPN Outcome: Progressing   Problem: Safety: Goal: Ability to remain free from injury will improve 04/30/2024 1910 by Lourdes Roy, LPN Outcome: Progressing 04/30/2024 1909 by Lourdes Roy, LPN Outcome: Progressing   Problem: Skin Integrity: Goal: Risk for impaired skin integrity will decrease 04/30/2024 1910 by Lourdes Roy, LPN Outcome: Progressing 04/30/2024 1909 by Lourdes Roy, LPN Outcome: Progressing

## 2024-04-30 NOTE — Plan of Care (Signed)
   Problem: Education: Goal: Knowledge of disease or condition will improve Outcome: Progressing

## 2024-04-30 NOTE — Progress Notes (Signed)
 Occupational Therapy Treatment Patient Details Name: Anita Reilly MRN: [id number] DOB: 01-06-1946 Today's Date: 04/30/2024   History of present illness Pt is an 78 y.o. female with PMHx of right face Bell's Palsy, HTN, HLD, RLS, GERD, Asthma and Depression who presented to the ED as a code stroke with L sided facial droop and hemiparesis s/p tNK. MRI negative. Per neurology note "likely stuttering lacunar MRI negative brainstem stroke".   OT comments  Anita Reilly was seen for OT treatment on this date. Upon arrival to room pt in bed starting breakfast, eager and agreeable to tx. Pt requires MIN A exit bed, good sitting balance. MIN A + RW sit<>stand from bed, improves to CGA standing from chair with arm rest use. MIN A x2 + RW for ADL t/f ~5 ft with +2 assist to facilitate stepping with LLE due to poor motor control. Improves to MIN A + RW for ~10 ft with verbal cues. MOD I seated hair brushing using non dominant LUE.   Completed standing exercises with BUE support on counter, MIN A to prevent compensatory trunk flexion/lateral leans, and use of mirror for feedback. Tolerated 2 set x 10 reps each side: standing marching, lateral side steps, stepping forward/backward. Tactile input and verbal cues to facilitate proper technique as pt demonstrates poor motor planning. Pt making good progress toward goals, will continue to follow POC. Discharge recommendation remains appropriate.        If plan is discharge home, recommend the following:  Assist for transportation;Help with stairs or ramp for entrance;A lot of help with walking and/or transfers;A lot of help with bathing/dressing/bathroom   Equipment Recommendations  Other (comment) (defer to next venue of care)    Recommendations for Other Services Rehab consult    Precautions / Restrictions Precautions Precautions: Fall Restrictions Weight Bearing Restrictions Per Provider Order: No       Mobility Bed Mobility Overal bed  mobility: Needs Assistance Bed Mobility: Supine to Sit     Supine to sit: Min assist, HOB elevated, Used rails          Transfers Overall transfer level: Needs assistance Equipment used: Rolling walker (2 wheels) Transfers: Sit to/from Stand Sit to Stand: Min assist                 Balance Overall balance assessment: Needs assistance Sitting-balance support: Feet supported, No upper extremity supported Sitting balance-Leahy Scale: Good     Standing balance support: Bilateral upper extremity supported Standing balance-Leahy Scale: Fair                             ADL either performed or assessed with clinical judgement   ADL Overall ADL's : Needs assistance/impaired                                       General ADL Comments: MIN A + RW for ADL t/f, initial +2 assist improving to +1. CGA to adjust B socks in sitting and MOD I hair brushing with non-dominant L hand.    Extremity/Trunk Assessment              Vision       Restaurant manager, fast food Communication: No apparent difficulties   Cognition Arousal: Alert Behavior During Therapy: WFL for tasks assessed/performed Cognition: No family/caregiver present to  determine baseline             OT - Cognition Comments: increased processing time and repeated cues for command following                 Following commands: Impaired Following commands impaired: Follows multi-step commands with increased time      Cueing   Cueing Techniques: Verbal cues  Exercises Other Exercises Other Exercises: standing exercises with BUE support on counter, MIN A to prevent compensatory trunk flexion/lateral leans, and use of mirror for feedback. Tolerated 2 set x 10 reps each side: standing marching, lateral side steps, stepping forward/backward    Shoulder Instructions       General Comments      Pertinent Vitals/ Pain       Pain  Assessment Pain Assessment: No/denies pain  Home Living                                          Prior Functioning/Environment              Frequency  Min 3X/week        Progress Toward Goals  OT Goals(current goals can now be found in the care plan section)  Progress towards OT goals: Progressing toward goals  Acute Rehab OT Goals OT Goal Formulation: With patient/family Time For Goal Achievement: 05/13/24 Potential to Achieve Goals: Good ADL Goals Pt Will Perform Grooming: standing;with contact guard assist Pt Will Perform Upper Body Dressing: with contact guard assist;sitting Pt Will Perform Lower Body Dressing: sitting/lateral leans;sit to/from stand;with contact guard assist Pt Will Transfer to Toilet: with contact guard assist;ambulating Pt Will Perform Toileting - Clothing Manipulation and hygiene: with min assist;sit to/from stand;sitting/lateral leans  Plan      Co-evaluation                 AM-PAC OT "6 Clicks" Daily Activity     Outcome Measure   Help from another person eating meals?: None Help from another person taking care of personal grooming?: None Help from another person toileting, which includes using toliet, bedpan, or urinal?: A Lot Help from another person bathing (including washing, rinsing, drying)?: A Lot Help from another person to put on and taking off regular upper body clothing?: A Lot Help from another person to put on and taking off regular lower body clothing?: A Little 6 Click Score: 17    End of Session Equipment Utilized During Treatment: Rolling walker (2 wheels);Gait belt  OT Visit Diagnosis: Other symptoms and signs involving the nervous system (R29.898);Other abnormalities of gait and mobility (R26.89);Muscle weakness (generalized) (M62.81);Unsteadiness on feet (R26.81);Ataxia, unspecified (R27.0)   Activity Tolerance Patient tolerated treatment well   Patient Left in chair;with call bell/phone  within reach;with chair alarm set   Nurse Communication Mobility status        Time: 9562-1308 OT Time Calculation (min): 35 min  Charges: OT General Charges $OT Visit: 1 Visit OT Treatments $Self Care/Home Management : 8-22 mins $Neuromuscular Re-education: 8-22 mins  Gordan Latina, M.S. OTR/L  04/30/24, 3:30 PM  ascom [phone]

## 2024-05-01 DIAGNOSIS — I639 Cerebral infarction, unspecified: Secondary | ICD-10-CM | POA: Diagnosis not present

## 2024-05-01 NOTE — Progress Notes (Signed)
 Occupational Therapy Treatment Patient Details Name: Anita Reilly MRN: [id number] DOB: 01-26-1946 Today's Date: 05/01/2024   History of present illness Pt is an 78 y.o. female with PMHx of right face Bell's Palsy, HTN, HLD, RLS, GERD, Asthma and Depression who presented to the ED as a code stroke with L sided facial droop and hemiparesis s/p tNK. MRI negative. Per neurology note likely stuttering lacunar MRI negative brainstem stroke.   OT comments  Ms Bushong was seen for OT treatment on this date. Upon arrival to room pt in chair, agreeable to tx. Pt requires MIN A + RW for ADL t/f ~30 ft, decreasing to MOD A  as pt fatgiues for ~30 ft - hands on assist to facilitate L hip flexion and prevent excessive R trunk lean. Ace wrap to L ankle to assist with dorsiflexion. SBA standing grooming tasks - 2 lateral LOBs, self corrects with UE support.   Pt tolerated standing exercises with BUE support on counter, MIN A to prevent compensatory trunk R lateral lean/L hip hiking, and use of mirror for feedback. Tolerated 1 set x 10 reps each side: standing marching, lateral side steps, stepping forward/backward. Pt making good progress toward goals, will continue to follow POC. Discharge recommendation remains appropriate.        If plan is discharge home, recommend the following:  Assist for transportation;Help with stairs or ramp for entrance;A lot of help with walking and/or transfers;A lot of help with bathing/dressing/bathroom   Equipment Recommendations  Other (comment) (defer)    Recommendations for Other Services      Precautions / Restrictions Precautions Precautions: Fall Restrictions Weight Bearing Restrictions Per Provider Order: No       Mobility Bed Mobility               General bed mobility comments: not tested    Transfers Overall transfer level: Needs assistance Equipment used: Rolling walker (2 wheels) Transfers: Sit to/from Stand Sit to Stand: Contact  guard assist           General transfer comment: cues for hand placement     Balance Overall balance assessment: Needs assistance Sitting-balance support: Feet supported, No upper extremity supported Sitting balance-Leahy Scale: Good     Standing balance support: No upper extremity supported, During functional activity Standing balance-Leahy Scale: Poor                             ADL either performed or assessed with clinical judgement   ADL Overall ADL's : Needs assistance/impaired                                       General ADL Comments: MIN A + RW for ADL t/f ~30 ft, decreasing to MOD A  as pt fatgiues for ~30 ft - assist to facilitate L hip flexion without excessive R trunk lean. MAX A don/doff L sock in standing. SBA standing grooming tasks - 2 lateral LOBs, self corrects with UE support.    Extremity/Trunk Assessment              Vision       Restaurant manager, fast food Communication: No apparent difficulties   Cognition Arousal: Alert Behavior During Therapy: WFL for tasks assessed/performed Cognition: Cognition impaired  OT - Cognition Comments: mild cognitive delay, inital MOD cueing fro technique improves to none                 Following commands: Impaired Following commands impaired: Follows multi-step commands with increased time      Cueing   Cueing Techniques: Verbal cues  Exercises Other Exercises Other Exercises: standing exercises with BUE support on counter, MIN A to prevent compensatory trunk R lateral lean/L hip hiking, and use of mirror for feedback. Tolerated 1 set x 10 reps each side: standing marching, lateral side steps, stepping forward/backward    Shoulder Instructions       General Comments      Pertinent Vitals/ Pain       Pain Assessment Pain Assessment: No/denies pain  Home Living                                           Prior Functioning/Environment              Frequency  Min 3X/week        Progress Toward Goals  OT Goals(current goals can now be found in the care plan section)  Progress towards OT goals: Progressing toward goals  Acute Rehab OT Goals OT Goal Formulation: With patient/family Time For Goal Achievement: 05/13/24 Potential to Achieve Goals: Good ADL Goals Pt Will Perform Grooming: standing;with contact guard assist Pt Will Perform Upper Body Dressing: with contact guard assist;sitting Pt Will Perform Lower Body Dressing: sitting/lateral leans;sit to/from stand;with contact guard assist Pt Will Transfer to Toilet: with contact guard assist;ambulating Pt Will Perform Toileting - Clothing Manipulation and hygiene: with min assist;sit to/from stand;sitting/lateral leans  Plan      Co-evaluation                 AM-PAC OT 6 Clicks Daily Activity     Outcome Measure   Help from another person eating meals?: None Help from another person taking care of personal grooming?: None Help from another person toileting, which includes using toliet, bedpan, or urinal?: A Lot Help from another person bathing (including washing, rinsing, drying)?: A Lot Help from another person to put on and taking off regular upper body clothing?: A Lot Help from another person to put on and taking off regular lower body clothing?: A Little 6 Click Score: 17    End of Session Equipment Utilized During Treatment: Rolling walker (2 wheels);Gait belt  OT Visit Diagnosis: Other symptoms and signs involving the nervous system (R29.898);Other abnormalities of gait and mobility (R26.89);Muscle weakness (generalized) (M62.81);Unsteadiness on feet (R26.81);Ataxia, unspecified (R27.0)   Activity Tolerance Patient tolerated treatment well   Patient Left in chair;with call bell/phone within reach;with chair alarm set;with family/visitor present   Nurse Communication          Time:  4098-1191 OT Time Calculation (min): 45 min  Charges: OT General Charges $OT Visit: 1 Visit OT Treatments $Self Care/Home Management : 8-22 mins $Neuromuscular Re-education: 8-22 mins $Therapeutic Exercise: 8-22 mins  Gordan Latina, M.S. OTR/L  05/01/24, 1:49 PM  ascom [phone]

## 2024-05-01 NOTE — Progress Notes (Signed)
 Physical Therapy Treatment Patient Details Name: Anita Reilly MRN: [id number] DOB: 1946/03/24 Today's Date: 05/01/2024   History of Present Illness Pt is an 78 y.o. female with PMHx of right face Bell's Palsy, HTN, HLD, RLS, GERD, Asthma and Depression who presented to the ED as a code stroke with L sided facial droop and hemiparesis s/p tNK. MRI negative. Per neurology note likely stuttering lacunar MRI negative brainstem stroke.    PT Comments  Pt was pleasant and motivated to participate during the session and put forth good effort throughout. Pt required CGA when completing STS from chair with RW. VC's were required for proper hand placement when completing STS. Pt ambulated 70 feet, then 30 feet with RW and min assist for stability, especially while advancing LLE. During ambulation, pt required multi-modal VC's for proper gait sequencing and cues were repeated throughout ambulation. Pt demonstrated ataxia with external rotation of the RLE while advancing LLE during ambulation, requiring extra time to advance LLE. L foot clearance improved throughout ambulation with repetitive VC's for proper gait sequencing. A chair follow was utilized during ambulation for patient safety. Pt will benefit from continued PT services upon discharge to safely address deficits listed in patient problem list for decreased caregiver assistance and eventual return to PLOF.      If plan is discharge home, recommend the following: A lot of help with walking and/or transfers;A little help with bathing/dressing/bathroom;Assistance with cooking/housework;Assist for transportation;Help with stairs or ramp for entrance   Can travel by private vehicle        Equipment Recommendations  Other (comment) (TBD at next venue of care)    Recommendations for Other Services       Precautions / Restrictions Precautions Precautions: Fall Recall of Precautions/Restrictions: Intact Precaution/Restrictions Comments: L  sided ataxia Restrictions Weight Bearing Restrictions Per Provider Order: No     Mobility  Bed Mobility Overal bed mobility: Needs Assistance             General bed mobility comments: Not assessed; pt received in chair    Transfers Overall transfer level: Needs assistance Equipment used: Rolling walker (2 wheels) Transfers: Sit to/from Stand Sit to Stand: Contact guard assist           General transfer comment: VC's for pushing off armrest of chair before grabbing RW    Ambulation/Gait Ambulation/Gait assistance: Min assist Gait Distance (Feet): 70 Feet x 1, 30 feet x 1 Assistive device: Rolling walker (2 wheels) Gait Pattern/deviations: Step-to pattern, Ataxic, Decreased stance time - left, Decreased step length - right Gait velocity: decreased     General Gait Details: Heavy multi-modal cuing for proper gait sequencing with RW. Throughout gait activity, VC's were repeated for proper gait sequencing. Pt able to make effortful steps with LLE clearing ground with multiple VC's. Chair follow for safety   Stairs             Wheelchair Mobility     Tilt Bed    Modified Rankin (Stroke Patients Only)       Balance Overall balance assessment: Needs assistance Sitting-balance support: Feet supported, No upper extremity supported Sitting balance-Leahy Scale: Good     Standing balance support: No upper extremity supported, During functional activity Standing balance-Leahy Scale: Poor Standing balance comment: Occasional min A for stability in standing primarily to prevent posterior LOB                            Communication  Communication Communication: No apparent difficulties  Cognition Arousal: Alert Behavior During Therapy: WFL for tasks assessed/performed   PT - Cognitive impairments: No apparent impairments                       PT - Cognition Comments: delayed processing requiring increased time/effort for 2-step  commands Following commands: Impaired Following commands impaired: Follows multi-step commands with increased time    Cueing Cueing Techniques: Verbal cues  Exercises      General Comments        Pertinent Vitals/Pain Pain Assessment Pain Assessment: No/denies pain    Home Living                          Prior Function            PT Goals (current goals can now be found in the care plan section) Acute Rehab PT Goals Patient Stated Goal: get better PT Goal Formulation: With patient Time For Goal Achievement: 05/14/24 Potential to Achieve Goals: Good Progress towards PT goals: Progressing toward goals    Frequency    Min 3X/week      PT Plan      Co-evaluation              AM-PAC PT 6 Clicks Mobility   Outcome Measure  Help needed turning from your back to your side while in a flat bed without using bedrails?: A Little Help needed moving from lying on your back to sitting on the side of a flat bed without using bedrails?: A Little Help needed moving to and from a bed to a chair (including a wheelchair)?: A Little Help needed standing up from a chair using your arms (e.g., wheelchair or bedside chair)?: A Little Help needed to walk in hospital room?: A Little Help needed climbing 3-5 steps with a railing? : A Lot 6 Click Score: 17    End of Session Equipment Utilized During Treatment: Gait belt Activity Tolerance: Patient tolerated treatment well Patient left: in chair;with call bell/phone within reach;with chair alarm set;with family/visitor present Nurse Communication: Mobility status PT Visit Diagnosis: Unsteadiness on feet (R26.81);Muscle weakness (generalized) (M62.81);History of falling (Z91.81);Ataxic gait (R26.0)     Time: 2956-2130 PT Time Calculation (min) (ACUTE ONLY): 43 min  Charges:                            Rhoda [facility], SPT 05/01/24, 5:18 PM

## 2024-05-01 NOTE — Progress Notes (Signed)
 Inpatient Rehab Admissions Coordinator:   Awaiting insurance determination regarding CIR prior auth request.  I will continue to follow.   Loye Rumble, PT, DPT Admissions Coordinator [phone] 05/01/24  10:39 AM

## 2024-05-01 NOTE — Progress Notes (Signed)
 Per Dr Marquette Sites, dc [facility]/stroke orders

## 2024-05-01 NOTE — Progress Notes (Signed)
 PROGRESS NOTE    Anita Reilly  NGE:[id number] DOB: 12-25-1945 DOA: 04/27/2024 PCP: Jimmy Moulding, MD  Chief Complaint  Patient presents with   Code Stroke    Hospital Course:  Anita Reilly is 78 year old female with hypertension, hyperlipidemia, restless leg syndrome, GERD, asthma, depression, history of right face Bell's palsy, presented to the ED as a code stroke.  In the ED vital signs were stable.  Stat head CT and CTA without LVO.  After teleneurology consultation decision was made to proceed with TNK at 6/7 at 2237.  Patient did well status post TNK and worked with physical therapy/Occupational Therapy.  Stay has been delayed pending rehab placement.  Subjective: No acute events overnight.  Patient reports she is feeling well.  She is working with occupational therapy on evaluation today.  She is making daily improvement.   Objective: Vitals:   05/01/24 0637 05/01/24 0755 05/01/24 1150 05/01/24 1548  BP: 116/78 (!) 114/102 121/87 117/72  Pulse:  75 87 82  Resp:  16 18 18   Temp:  98.5 F (36.9 C) 98.5 F (36.9 C) 97.9 F (36.6 C)  TempSrc:      SpO2:  96% 97%   Weight:      Height:        Intake/Output Summary (Last 24 hours) at 05/01/2024 1700 Last data filed at 05/01/2024 1500 Gross per 24 hour  Intake 240 ml  Output --  Net 240 ml   Filed Weights   04/27/24 2157 04/27/24 2213  Weight: 79.6 kg 79.6 kg    Examination: General exam: Appears calm and comfortable, NAD  Respiratory system: No work of breathing, symmetric chest wall expansion Cardiovascular system: S1 & S2 heard, RRR.  Gastrointestinal system: Abdomen is nondistended, soft and nontender.  Neuro: Alert and oriented. Extremities: Symmetric, expected ROM Skin: No rashes, lesions Psychiatry: Demonstrates appropriate judgement and insight. Mood & affect appropriate for situation.   Assessment & Plan:  Principal Problem:   Acute stroke due to ischemia (HCC)   Acute CVA -  Status post TNK - Brain MRI ultimately negative but has remained symptomatic.  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  If she is not able to tolerate this we will need to consider PCSK9 inhibitor - Continue with aspirin and Plavix for 3 weeks, then Plavix monotherapy - Continue on telemetry while admitted - Normotensive blood pressure goals - Continue PT/OT, pending CIR  Diabetes - HgbA1c 6.2 would benefit from metformin at discharge - Outpatient follow-up with PCP  Hypertension - Continue with current medication, titrate daily.  Trend closely - Normotensive blood pressure goals. At goal today.  Right-sided facial paralysis, Bell's palsy - Prior to arrival - Continue Lyrica  AKI, resolved - Baseline creatinine 1.0, creatinine on admission 1.6. - Monitor daily CMP - Creatinine clearance 44  DVT prophylaxis: s/p TNK this admission. On DAPT now. Add SCDs   Code Status: Full Code Disposition: Planning for CIR.  Pending insurance authorization.  Medically cleared otherwise for discharge  Consultants:    Procedures:    Antimicrobials:  Anti-infectives (From admission, onward)    None       Data Reviewed: I have personally reviewed following labs and imaging studies CBC: Recent Labs  Lab 04/27/24 2140 04/29/24 0222  WBC 7.9  6.0  NEUTROABS 4.5 2.6  HGB 14.3 13.6  HCT 41.6 40.2  MCV 91.6 92.4  PLT 289 255   Basic Metabolic Panel: Recent Labs  Lab 04/27/24 2140 04/29/24 0222  NA 136 136  K 3.8 4.5  CL 100 103  CO2 26 27  GLUCOSE 145* 110*  BUN 28* 20  CREATININE 1.61* 1.06*  CALCIUM 10.4* 9.2  MG  --  2.1  PHOS  --  4.0   GFR: Estimated Creatinine Clearance: 44 mL/min (A) (by C-G formula based on SCr of 1.06 mg/dL (H)). Liver Function  Tests: Recent Labs  Lab 04/27/24 2140  AST 26  ALT 19  ALKPHOS 77  BILITOT 0.7  PROT 7.8  ALBUMIN 4.1   CBG: Recent Labs  Lab 04/27/24 2140 04/28/24 0046  GLUCAP 132* 128*    Recent Results (from the past 240 hours)  MRSA Next Gen by PCR, Nasal     Status: None   Collection Time: 04/28/24 12:56 AM   Specimen: Nasal Mucosa; Nasal Swab  Result Value Ref Range Status   MRSA by PCR Next Gen NOT DETECTED NOT DETECTED Final    Comment: (NOTE) The GeneXpert MRSA Assay (FDA approved for NASAL specimens only), is one component of a comprehensive MRSA colonization surveillance program. It is not intended to diagnose MRSA infection nor to guide or monitor treatment for MRSA infections. Test performance is not FDA approved in patients less than 68 years old. Performed at [facility], [address], [place], [place] [address]      Radiology Studies: No results found.  Scheduled Meds:  aspirin EC  81 mg Oral Daily   atorvastatin  40 mg Oral QHS   clopidogrel  75 mg Oral Daily   losartan  50 mg Oral Daily   pantoprazole  40 mg Oral Daily   polyethylene glycol  17 g Oral Daily   pregabalin  25 mg Oral BID   sodium chloride flush  3 mL Intravenous Once   sodium chloride flush  3-10 mL Intravenous Q12H   Continuous Infusions:   LOS: 4 days  MDM: Patient is high risk for one or more organ failure.  They necessitate ongoing hospitalization for continued IV therapies and subsequent lab monitoring. Total time spent interpreting labs and vitals, reviewing the medical record, coordinating care amongst consultants and care team members, directly assessing and discussing care with the patient and/or family: 55 min  Tayvin Preslar, DO [facility]  To contact the attending physician between 7A-7P please use Epic Chat. To contact the covering physician during after hours 7P-7A, please review Amion.  05/01/2024, 5:00 PM   *This document has been created with the  assistance of dictation software. Please excuse typographical errors. *

## 2024-05-02 DIAGNOSIS — I639 Cerebral infarction, unspecified: Secondary | ICD-10-CM | POA: Diagnosis not present

## 2024-05-02 LAB — COMPREHENSIVE METABOLIC PANEL WITH GFR
ALT: 13 U/L (ref 0–44)
AST: 20 U/L (ref 15–41)
Albumin: 3.5 g/dL (ref 3.5–5.0)
Alkaline Phosphatase: 81 U/L (ref 38–126)
Anion gap: 9 (ref 5–15)
BUN: 20 mg/dL (ref 8–23)
CO2: 22 mmol/L (ref 22–32)
Calcium: 8.9 mg/dL (ref 8.9–10.3)
Chloride: 103 mmol/L (ref 98–111)
Creatinine, Ser: 1.01 mg/dL — ABNORMAL HIGH (ref 0.44–1.00)
GFR, Estimated: 57 mL/min — ABNORMAL LOW (ref >60)
Glucose, Bld: 108 mg/dL — ABNORMAL HIGH (ref 70–99)
Potassium: 4.5 mmol/L (ref 3.5–5.1)
Sodium: 134 mmol/L — ABNORMAL LOW (ref 135–145)
Total Bilirubin: 1.2 mg/dL (ref 0.0–1.2)
Total Protein: 6.7 g/dL (ref 6.5–8.1)

## 2024-05-02 LAB — CBC
HCT: 38.2 % (ref 36.0–46.0)
Hemoglobin: 13.2 g/dL (ref 12.0–15.0)
MCH: 31.1 pg (ref 26.0–34.0)
MCHC: 34.6 g/dL (ref 30.0–36.0)
MCV: 90.1 fL (ref 80.0–100.0)
Platelets: 252 10*3/uL (ref 150–400)
RBC: 4.24 MIL/uL (ref 3.87–5.11)
RDW: 12.6 % (ref 11.5–15.5)
WBC: 6.3 10*3/uL (ref 4.0–10.5)
nRBC: 0 % (ref 0.0–0.2)

## 2024-05-02 MED ORDER — POLYETHYLENE GLYCOL 3350 17 G PO PACK
17.0000 g | PACK | Freq: Two times a day (BID) | ORAL | Status: DC
  Administered 2024-05-02 – 2024-05-03 (×2): 17 g via ORAL
  Filled 2024-05-02 (×2): qty 1

## 2024-05-02 NOTE — Progress Notes (Addendum)
 Physical Therapy Treatment Patient Details Name: Anita Reilly MRN: [id number] DOB: 1946-10-25 Today's Date: 05/02/2024   History of Present Illness Pt is an 78 y.o. female with PMHx of right face Bell's Palsy, HTN, HLD, RLS, GERD, Asthma and Depression who presented to the ED as a code stroke with L sided facial droop and hemiparesis s/p tNK. MRI negative. Per neurology note likely stuttering lacunar MRI negative brainstem stroke.    PT Comments  Pt was pleasant and motivated to participate during the session and put forth good effort throughout. Pt reported 0/10 on NPS upon arrival. Pt was mod I coming from supine to sitting EOB using the bedrails for stability. Pt required CGA completing STS from EOB with RW with VC's for proper hand placement for pushoff. Pt ambulated 200 feet in 26 minutes with min assist and RW and constant VC's for proper gait sequencing. After ambulation, pt reported 5/10 on NPS in lower back. Pt carryover of VC's involving proper gait sequencing with RW during ambulation was inconsistent. Pt was able to clear LLE intermittently with constant VC's throughout ambulation. During 10 foot walk test, pt's gait velocity was 0.127 ft/sec. Pt will benefit from continued PT services upon discharge to safely address deficits listed in patient problem list for decreased caregiver assistance and eventual return to PLOF.         If plan is discharge home, recommend the following: A lot of help with walking and/or transfers;A little help with bathing/dressing/bathroom;Assistance with cooking/housework;Assist for transportation;Help with stairs or ramp for entrance   Can travel by private vehicle        Equipment Recommendations  Other (comment) (TBD at next venue of care)    Recommendations for Other Services       Precautions / Restrictions Precautions Precautions: Fall Recall of Precautions/Restrictions: Intact Precaution/Restrictions Comments: L sided  ataxia Restrictions Weight Bearing Restrictions Per Provider Order: No     Mobility  Bed Mobility Overal bed mobility: Modified Independent Bed Mobility: Supine to Sit     Supine to sit: Supervision     General bed mobility comments: used bedrails to come to sitting EOB from supine    Transfers Overall transfer level: Needs assistance Equipment used: Rolling walker (2 wheels) Transfers: Sit to/from Stand Sit to Stand: Contact guard assist           General transfer comment: VC's for pushing off bed and elevated BSC before grabbing RW    Ambulation/Gait Ambulation/Gait assistance: Min assist Gait Distance (Feet): 200 Feet Assistive device: Rolling walker (2 wheels) Gait Pattern/deviations: Step-to pattern, Ataxic, Decreased stance time - left, Decreased step length - right Gait velocity: 0.127 ft/sec Gait velocity interpretation: <1.31 ft/sec, indicative of household ambulator   General Gait Details: Heavy multi-modal cuing for proper gait sequencing with RW. Throughout gait activity, VC's were repeated for proper gait sequencing. Pt able to make effortful steps with LLE clearing ground with multiple VC's. Pt completed 10 foot walk test in 1 minute and 18.53 seconds. Pt ambulated 200 feet in 26 minutes. Cues to progress from step to pattern to step through pattern, pt able to inconsistently do so, however, self-select step to pattern persisted much of the time. One instance during gait, pt increased weight bearing on RLE and left side of RW elevated the ground, but pt was able to self arrest without physical assistance.     Stairs             Armed forces technical officer  Bed    Modified Rankin (Stroke Patients Only)       Balance Overall balance assessment: Needs assistance Sitting-balance support: Feet supported, No upper extremity supported Sitting balance-Leahy Scale: Good     Standing balance support: No upper extremity supported, During functional  activity Standing balance-Leahy Scale: Poor Standing balance comment: Occasional min A for stability in standing primarily to prevent posterior LOB                            Communication Communication Communication: No apparent difficulties  Cognition Arousal: Alert Behavior During Therapy: WFL for tasks assessed/performed   PT - Cognitive impairments: No apparent impairments                       PT - Cognition Comments: delayed processing requiring increased time/effort for 2-step commands Following commands: Impaired Following commands impaired: Follows multi-step commands with increased time    Cueing Cueing Techniques: Verbal cues  Exercises      General Comments        Pertinent Vitals/Pain Pain Assessment Pain Assessment: Pt reported 0/10 on NPS before ambulation and 5/10 on NPS in low back after ambulation    Home Living                          Prior Function            PT Goals (current goals can now be found in the care plan section) Acute Rehab PT Goals Patient Stated Goal: get better PT Goal Formulation: With patient Time For Goal Achievement: 05/15/24 Potential to Achieve Goals: Good Progress towards PT goals: Progressing toward goals    Frequency    Min 3X/week      PT Plan      Co-evaluation              AM-PAC PT 6 Clicks Mobility   Outcome Measure  Help needed turning from your back to your side while in a flat bed without using bedrails?: A Little Help needed moving from lying on your back to sitting on the side of a flat bed without using bedrails?: A Little Help needed moving to and from a bed to a chair (including a wheelchair)?: A Little Help needed standing up from a chair using your arms (e.g., wheelchair or bedside chair)?: A Little Help needed to walk in hospital room?: A Little Help needed climbing 3-5 steps with a railing? : A Lot 6 Click Score: 17    End of Session Equipment  Utilized During Treatment: Gait belt Activity Tolerance: Patient tolerated treatment well Patient left: in chair;with call bell/phone within reach;with chair alarm set Nurse Communication: Mobility status PT Visit Diagnosis: Unsteadiness on feet (R26.81);Muscle weakness (generalized) (M62.81);History of falling (Z91.81);Ataxic gait (R26.0)     Time: 4098-1191 PT Time Calculation (min) (ACUTE ONLY): 56 min  Charges:                            [facility], SPT 05/02/24, 10:49 AM This entire session was performed under direct supervision and direction of a licensed therapist/therapist assistant. I have personally read, edited and approve of the note as written.  Lonn Roads, DPT

## 2024-05-02 NOTE — H&P (Signed)
 Physical Medicine and Rehabilitation Admission H&P    Chief Complaint  Patient presents with   Code Stroke  : HPI: Anita Reilly is a 78 year old right-handed female with history of right face Bell's palsy, hypertension, hyperlipidemia, restless leg syndrome, GERD, asthma.  Per chart review patient lives alone.  One-step to entry of home.  Independent driving prior to admission.  Presented to [facility]/27/[facility]24 with acute left-sided weakness/question syncopal event and altered mental status.  Cranial CT scan negative for acute changes.  CTA showed no emergent large vessel occlusion or high-grade stenosis.  Patient did receive TNK.  MRI 04/28/2024 showed no acute intracranial abnormality.  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  Admission chemistries unremarkable except BUN 28 creatinine 1.61, hemoglobin A1c 6.2.  Echocardiogram with ejection fraction of 65 to 70% no wall motion abnormalities grade 1 diastolic dysfunction.  Neurology follow-up placed on aspirin  81 mg daily and Plavix  75 mg daily for CVA prophylaxis x 3 weeks then Plavix  alone.  Tolerating a regular consistency diet.  Therapy evaluations completed due to patient decreased functional ability left-sided weakness was admitted for a comprehensive rehab program.  Review of Systems  Constitutional:  Negative for chills and fever.  HENT:  Negative for hearing loss.   Eyes:  Negative for blurred vision and double vision.  Respiratory:  Negative for cough, shortness of breath and wheezing.   Cardiovascular:  Negative for chest pain and palpitations.  Gastrointestinal:  Positive for constipation. Negative for heartburn, nausea and vomiting.  Genitourinary:  Negative for dysuria, flank pain and hematuria.  Musculoskeletal:  Positive for joint pain and myalgias.  Skin:  Negative for rash.  Neurological:  Positive for speech  change and weakness.       Patient with history of right facial weakness from Bell's palsy  All other systems reviewed and are negative.  Past Medical History:  Diagnosis Date   Arthritis    shoulders   Bell's palsy 2016   Vertigo    none in several years   Past Surgical History:  Procedure Laterality Date   BREAST BIOPSY Left 05/06/2021   u/s bx, hydromark, path pending   CARPAL TUNNEL RELEASE     left    CATARACT EXTRACTION W/PHACO Right 09/19/2018   Procedure: CATARACT EXTRACTION PHACO AND INTRAOCULAR LENS PLACEMENT (IOC) RIGHT;  Surgeon: Annell Kidney, MD;  Location: [facility];  Service: Ophthalmology;  Laterality: Right;  latex sensitivity   CATARACT EXTRACTION W/PHACO Left 10/10/2018   Procedure: CATARACT EXTRACTION PHACO AND INTRAOCULAR LENS PLACEMENT (IOC)  LEFT;  Surgeon: Annell Kidney, MD;  Location: [facility];  Service: Ophthalmology;  Laterality: Left;  Latex sensitivity requests arrival around 10 or 10:30   COLONOSCOPY     Family History  Problem Relation Age of Onset   Breast cancer Neg Hx    Social History:  reports that she has never smoked. She has never used smokeless tobacco. She reports that she does not drink alcohol and does not use drugs. Allergies:  Allergies  Allergen Reactions   Latex     Dental gloves caused lip swelling   Sulfa Antibiotics Swelling    lips   Tylox [Oxycodone-Acetaminophen ]    Penicillins Rash    Veetids caused rash   Medications Prior to Admission  Medication Sig Dispense Refill   Calcium  Carbonate-Vitamin D (CALTRATE 600+D PO) Take 1 tablet by mouth daily.  desipramine (NOPRAMIN) 10 MG tablet Take 10 mg by mouth daily.     losartan  (COZAAR ) 50 MG tablet Take 50 mg by mouth daily.     pregabalin  (LYRICA ) 25 MG capsule Take 1 capsule by mouth 2 (two) times daily.        Home: Home Living Family/patient expects to be discharged to:: Private residence Living Arrangements:  Alone Available Help at Discharge: Family, Available PRN/intermittently Type of Home: House Home Access: Stairs to enter, Ramped entrance Secretary/administrator of Steps: 1 STE after ramp Entrance Stairs-Rails: None Home Layout: Two level, Able to live on main level with bedroom/bathroom Alternate Level Stairs-Number of Steps: 6, landing and 6+ Alternate Level Stairs-Rails: Right Bathroom Shower/Tub: Tub/shower unit, Health visitor: Standard Home Equipment: None   Functional History: Prior Function Prior Level of Function : Driving, Independent/Modified Independent Mobility Comments: independent ADLs Comments: independent including driving  Functional Status:  Mobility: Bed Mobility Overal bed mobility: Modified Independent Bed Mobility: Supine to Sit Supine to sit: Supervision General bed mobility comments: Not tested-pt received in chair Transfers Overall transfer level: Needs assistance Equipment used: None Transfers: Sit to/from Stand Sit to Stand: Contact guard assist General transfer comment: VC's for pushing off bed before grabbing RW Ambulation/Gait Ambulation/Gait assistance: Min assist Gait Distance (Feet): 200 Feet Assistive device: Rolling walker (2 wheels) Gait Pattern/deviations: Step-to pattern, Ataxic, Decreased stance time - left, Decreased step length - right General Gait Details: Heavy multi-modal cuing for proper gait sequencing with RW. Throughout gait activity, VC's were repeated for proper gait sequencing. Pt able to make effortful steps with LLE clearing ground with multiple VC's. Pt completed 10 foot walk test in 1 minute and 18.53 seconds. Pt ambulated 200 feet in 26 minutes. Gait velocity: 0.127 ft/sec Gait velocity interpretation: <1.31 ft/sec, indicative of household ambulator    ADL: ADL Overall ADL's : Needs assistance/impaired Eating/Feeding: Sitting, Set up Grooming: Sitting, Set up Upper Body Bathing: Sitting, Minimal  assistance Lower Body Bathing: Sit to/from stand, Sitting/lateral leans, Maximal assistance Upper Body Dressing : Sitting, Contact guard assist Lower Body Dressing: Sit to/from stand, Sitting/lateral leans, Maximal assistance Lower Body Dressing Details (indicate cue type and reason): maxA to doff socks and on shoes Toilet Transfer: +2 for physical assistance, +2 for safety/equipment, Minimal assistance, BSC/3in1, Rolling walker (2 wheels) Toilet Transfer Details (indicate cue type and reason): clinical judgement bed > recliner. anticipate pt will require BSC for safe transfers until LLE axaxia improves Toileting- Clothing Manipulation and Hygiene: Maximal assistance, Sit to/from stand Functional mobility during ADLs: Cueing for safety, Cueing for sequencing, +2 for physical assistance, Minimal assistance General ADL Comments: MIN A + RW for ADL t/f ~30 ft, decreasing to MOD A  as pt fatgiues for ~30 ft - assist to facilitate L hip flexion without excessive R trunk lean. MAX A don/doff L sock in standing. SBA standing grooming tasks - 2 lateral LOBs, self corrects with UE support.  Cognition: Cognition Orientation Level: Oriented X4 Cognition Arousal: Alert Behavior During Therapy: WFL for tasks assessed/performed  Physical Exam: Blood pressure 113/77, pulse 68, temperature 97.7 F (36.5 C), temperature source Oral, resp. rate 18, height 5' 4 (1.626 m), weight 79.6 kg, SpO2 97%. Physical Exam Constitutional:      Appearance: She is obese.  HENT:     Head: Normocephalic and atraumatic.     Right Ear: External ear normal.     Left Ear: External ear normal.     Nose: Nose normal.  Mouth/Throat:     Mouth: Mucous membranes are moist.   Eyes:     Extraocular Movements: Extraocular movements intact.     Pupils: Pupils are equal, round, and reactive to light.    Cardiovascular:     Rate and Rhythm: Normal rate and regular rhythm.     Pulses: Normal pulses.  Pulmonary:      Effort: Pulmonary effort is normal. No respiratory distress.     Breath sounds: No wheezing.  Abdominal:     General: There is no distension.     Palpations: Abdomen is soft.   Musculoskeletal:        General: No swelling. Normal range of motion.     Cervical back: Normal range of motion.   Skin:    General: Skin is warm and dry.     Comments: NSL LUE.    Neurological:     Mental Status: She is alert.     Comments: Alert and oriented x 3. Normal insight and awareness. Intact Memory. Normal language and speech. Cranial nerve exam unremarkable except for mild right central VII. MMT: RUE 5/5. LUE 4/5 deltoid, 4+ biceps, triceps, wrist and hand. RLE 4+ to 5/5 LLE 2 to 2+ HF, KE and 3+/5 ADF/PF. Sensory exam normal for light touch and pain in all 4 limbs. No limb ataxia or cerebellar signs. No abnormal tone appreciated. DTR's 3+ LUE and LLE.     Psychiatric:        Mood and Affect: Mood normal.        Behavior: Behavior normal.     Results for orders placed or performed during the hospital encounter of 04/27/24 (from the past 48 hours)  CBC     Status: None   Collection Time: 05/02/24  5:34 AM  Result Value Ref Range   WBC 6.3 4.0 - 10.5 K/uL   RBC 4.24 3.87 - 5.11 MIL/uL   Hemoglobin 13.2 12.0 - 15.0 g/dL   HCT 69.6 29.5 - 28.4 %   MCV 90.1 80.0 - 100.0 fL   MCH 31.1 26.0 - 34.0 pg   MCHC 34.6 30.0 - 36.0 g/dL   RDW 13.2 44.0 - 10.2 %   Platelets 252 150 - 400 K/uL   nRBC 0.0 0.0 - 0.2 %    Comment: Performed at [facility], [address] [place], [place] [address]  Comprehensive metabolic panel with GFR     Status: Abnormal   Collection Time: 05/02/24  5:34 AM  Result Value Ref Range   Sodium 134 (L) 135 - 145 mmol/L   Potassium 4.5 3.5 - 5.1 mmol/L   Chloride 103 98 - 111 mmol/L   CO2 22 22 - 32 mmol/L   Glucose, Bld 108 (H) 70 - 99 mg/dL    Comment: Glucose reference range applies only to samples taken after fasting for at least 8 hours.   BUN 20 8 - 23  mg/dL   Creatinine, Ser 6.44 (H) 0.44 - 1.00 mg/dL   Calcium  8.9 8.9 - 10.3 mg/dL   Total Protein 6.7 6.5 - 8.1 g/dL   Albumin 3.5 3.5 - 5.0 g/dL   AST 20 15 - 41 U/L   ALT 13 0 - 44 U/L   Alkaline Phosphatase 81 38 - 126 U/L   Total Bilirubin 1.2 0.0 - 1.2 mg/dL   GFR, Estimated 57 (L) >60 mL/min    Comment: (NOTE) Calculated using the CKD-EPI Creatinine Equation (2021)    Anion gap 9 5 - 15  Comment: Performed at [facility], [address] [place], [place] [address]   No results found.    Blood pressure 113/77, pulse 68, temperature 97.7 F (36.5 C), temperature source Oral, resp. rate 18, height 5' 4 (1.626 m), weight 79.6 kg, SpO2 97%.  Medical Problem List and Plan: 1. Functional deficits secondary to lacunar infarction.  Status post TNK  -patient may shower  -ELOS/Goals: 7-10 days, mod I to supervision goals 2.  Antithrombotics: -DVT/anticoagulation:  Mechanical: Antiembolism stockings, thigh (TED hose) Bilateral lower extremities  -antiplatelet therapy: Aspirin  81 mg daily and Plavix  75 mg daily x 3 weeks then Plavix  alone 3. Pain Management: Lyrica  25 mg twice daily 4. Mood/Behavior/Sleep: Trazodone  50 mg nightly as needed.  Provide emotional support  -antipsychotic agents: N/A 5. Neuropsych/cognition: This patient is capable of making decisions on her own behalf. 6. Skin/Wound Care: Routine skin checks 7. Fluids/Electrolytes/Nutrition: Routine in and outs with follow-up chemistries 8.  Hypertension.  Cozaar  50 mg daily.  Monitor with increased mobility 9.  Hyperlipidemia.  Lipitor  10.  History of right face Bell's palsy.  Maintained on Lyrica  25 mg twice daily follow-up outpatient 11.  GERD.  Protonix  12.  Hemoglobin A1c 6.2.  Check CBGs AC and at bedtime/SSI.  Consider adding metformin. 13.  Constipation.  MiraLAX  daily, Senokot S1 tablet at bedtime as needed  Sterling Eisenmenger, PA-C 05/03/2024

## 2024-05-02 NOTE — Progress Notes (Signed)
 Inpatient Rehab Admissions Coordinator:   I did receive approval for CIR admission, but I do not have a bed for this patient to admit today.  I called pt and son to update and review benefits with them both.  Will follow for potential admit, hopeful for the next 24-48 hours.   Loye Rumble, PT, DPT Admissions Coordinator [phone] 05/02/24  11:06 AM

## 2024-05-02 NOTE — Progress Notes (Signed)
 PROGRESS NOTE    Anita Reilly  ZOX:[id number] DOB: 10/31/1946 DOA: 04/27/2024 PCP: Jimmy Moulding, MD  Chief Complaint  Patient presents with   Code Stroke    Hospital Course:  Anita Reilly is 78 year old female with hypertension, hyperlipidemia, restless leg syndrome, GERD, asthma, depression, history of right face Bell's palsy, presented to the ED as a code stroke.  In the ED vital signs were stable.  Stat head CT and CTA without LVO.  After teleneurology consultation decision was made to proceed with TNK at 6/7 at 2237.  Patient did well status post TNK and worked with physical therapy/Occupational Therapy.  Stay has been delayed pending rehab placement.  Subjective: No acute events overnight.  Patient remains stable.  Waiting for inpatient rehab bed. Patient does want to discuss her CODE STATUS today.  She reports previously she was a DNR/DNI.  We discussed what this entails.  She would like her CODE STATUS changed during this admission to reflect DNR DNI.  She is clear that if she is clinically deceased that she would not want CPR or artificial means to breathe for her.  Objective: Vitals:   05/02/24 0044 05/02/24 0605 05/02/24 1205 05/02/24 1620  BP: 113/77 119/74 111/67 115/68  Pulse: 71 68 77 72  Resp: 18 18 18 18   Temp: 98.1 F (36.7 C) (!) 97.4 F (36.3 C) 98.3 F (36.8 C) 98.2 F (36.8 C)  TempSrc:      SpO2: 94% 96% 95% 95%  Weight:      Height:       No intake or output data in the 24 hours ending 05/02/24 1630  Filed Weights   04/27/24 2157 04/27/24 2213  Weight: 79.6 kg 79.6 kg    Examination: General exam: Appears calm and comfortable, NAD  Respiratory system: No work of breathing, symmetric chest wall expansion Cardiovascular system: S1 & S2 heard, RRR.  Gastrointestinal system: Abdomen is nondistended, soft and nontender.  Neuro: Alert and oriented. Extremities: Symmetric, expected ROM Skin: No rashes, lesions Psychiatry:  Demonstrates appropriate judgement and insight. Mood & affect appropriate for situation.   Assessment & Plan:  Principal Problem:   Acute stroke due to ischemia (HCC)   Acute CVA - Status post TNK - Brain MRI ultimately negative but has remained symptomatic.  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  If she is not able to tolerate this we will need to consider PCSK9 inhibitor - Continue with aspirin and Plavix for 3 weeks then Plavix monotherapy - Continue on telemetry while admitted - Normotensive blood pressure goals - Continue PT/OT.  Patient is working well with the rehab team.  Pending CIR when bed and authorization is available.  TOC working on it.  Diabetes - HgbA1c 6.2 would benefit from metformin at discharge -no need for insulin at this time - Outpatient follow-up with PCP  Hypertension - Blood pressure at goal.  Trend closely.  Continue current medications   Right-sided facial paralysis, Bell's palsy - Prior to arrival - Continue Lyrica  AKI, resolved - Baseline creatinine 1.0, creatinine on admission 1.6. - Monitor daily CMP - Creatinine clearance 46  DVT prophylaxis: s/p TNK this admission. On DAPT now with SCDs   Code Status: Full Code Disposition: Planning for CIR.  Pending insurance authorization.  Medically cleared otherwise for discharge  Consultants:    Procedures:    Antimicrobials:  Anti-infectives (From admission, onward)    None       Data Reviewed: I have personally reviewed following labs and imaging studies CBC: Recent Labs  Lab 04/27/24 2140 04/29/24 0222 05/02/24 0534  WBC 7.9 6.0 6.3  NEUTROABS 4.5 2.6  --   HGB 14.3 13.6 13.2  HCT 41.6 40.2 38.2  MCV 91.6 92.4 90.1  PLT 289 255 252   Basic Metabolic  Panel: Recent Labs  Lab 04/27/24 2140 04/29/24 0222 05/02/24 0534  NA 136 136 134*  K 3.8 4.5 4.5  CL 100 103 103  CO2 26 27 22   GLUCOSE 145* 110* 108*  BUN 28* 20 20  CREATININE 1.61* 1.06* 1.01*  CALCIUM 10.4* 9.2 8.9  MG  --  2.1  --   PHOS  --  4.0  --    GFR: Estimated Creatinine Clearance: 46.1 mL/min (A) (by C-G formula based on SCr of 1.01 mg/dL (H)). Liver Function Tests: Recent Labs  Lab 04/27/24 2140 05/02/24 0534  AST 26 20  ALT 19 13  ALKPHOS 77 81  BILITOT 0.7 1.2  PROT 7.8 6.7  ALBUMIN 4.1 3.5   CBG: Recent Labs  Lab 04/27/24 2140 04/28/24 0046  GLUCAP 132* 128*    Recent Results (from the past 240 hours)  MRSA Next Gen by PCR, Nasal     Status: None   Collection Time: 04/28/24 12:56 AM   Specimen: Nasal Mucosa; Nasal Swab  Result Value Ref Range Status   MRSA by PCR Next Gen NOT DETECTED NOT DETECTED Final    Comment: (NOTE) The GeneXpert MRSA Assay (FDA approved for NASAL specimens only), is one component of a comprehensive MRSA colonization surveillance program. It is not intended to diagnose MRSA infection nor to guide or monitor treatment for MRSA infections. Test performance is not FDA approved in patients less than 19 years old. Performed at [facility], [address] [place], [place] [address]      Radiology Studies: No results found.  Scheduled Meds:  aspirin EC  81 mg Oral Daily   atorvastatin  40 mg Oral QHS   clopidogrel  75 mg Oral Daily   losartan  50 mg Oral Daily   pantoprazole  40 mg Oral Daily   polyethylene glycol  17 g Oral Daily   pregabalin  25 mg Oral BID   sodium chloride flush  3 mL Intravenous Once   sodium chloride flush  3-10 mL Intravenous Q12H   Continuous Infusions:   LOS: 5 days  MDM: Patient is high risk for one or more organ failure.  They necessitate ongoing hospitalization for continued IV therapies and subsequent lab monitoring. Total time spent interpreting labs and vitals,  reviewing the medical record, coordinating care amongst consultants and care team members, directly assessing and discussing care with the patient and/or family: 35 min  Amaira Safley, DO [facility]  To contact the attending physician between 7A-7P please use Epic Chat. To contact the covering physician during after hours 7P-7A, please review Amion.  05/02/2024, 4:30 PM   *This document has been created with the assistance of dictation software. Please excuse typographical errors. *

## 2024-05-02 NOTE — TOC Progression Note (Signed)
 Transition of Care [facility]) - Progression Note    Patient Details  Name: Anita Reilly MRN: [id number] Date of Birth: October 05, 1946  Transition of [facility]) CM/SW Contact  Zoe Hinds, RN Phone Number: 05/02/2024, 11:38 AM  Clinical Narrative:    This CM informed by PT pt pending a bed with CIR and [facility] is approved once a bed is available . TOC will cont to follow dc planning/ care coordination and update as applicable.    Expected Discharge Plan: IP Rehab Facility Barriers to Discharge: Continued Medical Work up  Expected Discharge Plan and Services       Living arrangements for the past 2 months: Single Family Home                                       Social Determinants of Health (SDOH) Interventions SDOH Screenings   Food Insecurity: No Food Insecurity (04/28/2024)  Housing: Low Risk  (04/28/2024)  Transportation Needs: No Transportation Needs (04/28/2024)  Utilities: Not At Risk (04/28/2024)  Financial Resource Strain: Low Risk  (10/31/2023)   Received from [facility]  Social Connections: Unknown (04/28/2024)  Tobacco Use: Low Risk  (04/27/2024)    Readmission Risk Interventions     No data to display

## 2024-05-02 NOTE — Progress Notes (Signed)
 Occupational Therapy Treatment Patient Details Name: Anita Reilly MRN: [id number] DOB: Nov 12, 1946 Today's Date: 05/02/2024   History of present illness Pt is an 78 y.o. female with PMHx of right face Bell's Palsy, HTN, HLD, RLS, GERD, Asthma and Depression who presented to the ED as a code stroke with L sided facial droop and hemiparesis s/p tNK. MRI negative. Per neurology note likely stuttering lacunar MRI negative brainstem stroke.   OT comments  Anita Reilly seen for OT treatment on this date. Upon arrival to room pt seated in chair, fatigued from PT session, however, motivated to participate. Pt engaged in functional reaching and balance task with UE support on windowsill, requiring MIN A to prevent posterior lean. Performed 3 separate sets of lateral, above head, and knee-height reaching tasks inside and outside base of support and crossing midline; tolerated 20 min. of standing. Noted deficits include need for multiple rest breaks, and increased time required to locate items.   Provided general education on OT role in the inpatient rehab setting and answered questions. Pt making good progress toward goals, will continue to follow POC. Discharge recommendation remains appropriate.        If plan is discharge home, recommend the following:  Assist for transportation;Help with stairs or ramp for entrance;A lot of help with walking and/or transfers;A lot of help with bathing/dressing/bathroom   Equipment Recommendations  Other (comment)    Recommendations for Other Services Rehab consult    Precautions / Restrictions Precautions Precautions: Fall Recall of Precautions/Restrictions: Intact Restrictions Weight Bearing Restrictions Per Provider Order: No       Mobility Bed Mobility               General bed mobility comments: Not tested-pt received in chair    Transfers Overall transfer level: Needs assistance Equipment used: None Transfers: Sit to/from  Stand Sit to Stand: Contact guard assist                 Balance Overall balance assessment: Needs assistance Sitting-balance support: Feet supported, Bilateral upper extremity supported Sitting balance-Anita Reilly: Good     Standing balance support: Single extremity supported, During functional activity Standing balance-Anita Reilly: Fair                             ADL either performed or assessed with clinical judgement   ADL                                              Extremity/Trunk Assessment Upper Extremity Assessment Upper Extremity Assessment: Overall WFL for tasks assessed   Lower Extremity Assessment Lower Extremity Assessment: Generalized weakness        Vision       Perception     Praxis     Communication Communication Communication: No apparent difficulties   Cognition Arousal: Alert Behavior During Therapy: WFL for tasks assessed/performed               OT - Cognition Comments: cognition has improved; still slight delay in sequencing tasks                 Following commands: Intact        Cueing   Cueing Techniques: Verbal cues, Tactile cues, Gestural cues  Exercises Other Exercises Other Exercises: functional reaching and standing balance tasks with  UE support on windowsill, MIN A to prevent posterior lean. lateral, above head, knee height reaching both in and outside base of support and crossing midline    Shoulder Instructions       General Comments      Pertinent Vitals/ Pain       Pain Assessment Pain Assessment: No/denies pain  Home Living                                          Prior Functioning/Environment              Frequency  Min 3X/week        Progress Toward Goals  OT Goals(current goals can now be found in the care plan section)  Progress towards OT goals: Progressing toward goals  Acute Rehab OT Goals Patient Stated Goal: to improve  function OT Goal Formulation: With patient Time For Goal Achievement: 05/16/24 Potential to Achieve Goals: Good ADL Goals Pt Will Perform Grooming: standing;with contact guard assist Pt Will Perform Upper Body Dressing: with contact guard assist;sitting Pt Will Perform Lower Body Dressing: sitting/lateral leans;sit to/from stand;with contact guard assist Pt Will Transfer to Toilet: with contact guard assist;ambulating Pt Will Perform Toileting - Clothing Manipulation and hygiene: with min assist;sit to/from stand;sitting/lateral leans  Plan      Co-evaluation                 AM-PAC OT 6 Clicks Daily Activity     Outcome Measure   Help from another person eating meals?: None Help from another person taking care of personal grooming?: None Help from another person toileting, which includes using toliet, bedpan, or urinal?: A Little Help from another person bathing (including washing, rinsing, drying)?: A Lot Help from another person to put on and taking off regular upper body clothing?: A Little Help from another person to put on and taking off regular lower body clothing?: A Little 6 Click Score: 19    End of Session Equipment Utilized During Treatment: Gait belt  OT Visit Diagnosis: Other symptoms and signs involving the nervous system (R29.898);Other abnormalities of gait and mobility (R26.89);Muscle weakness (generalized) (M62.81);Unsteadiness on feet (R26.81);Ataxia, unspecified (R27.0)   Activity Tolerance Patient tolerated treatment well   Patient Left in chair;with call bell/phone within reach;with chair alarm set   Nurse Communication          Time: 6578-4696 OT Time Calculation (min): 32 min  Charges: OT General Charges $OT Visit: 1 Visit OT Treatments $Therapeutic Activity: 23-37 mins  Stevenson Elbe, Student OT   [employer] 05/02/2024, 4:32 PM

## 2024-05-03 ENCOUNTER — Encounter (HOSPITAL_COMMUNITY): Payer: Self-pay | Admitting: Physical Medicine and Rehabilitation

## 2024-05-03 ENCOUNTER — Other Ambulatory Visit: Payer: Self-pay

## 2024-05-03 ENCOUNTER — Inpatient Hospital Stay (HOSPITAL_COMMUNITY)
Admission: AD | Admit: 2024-05-03 | Discharge: 2024-05-09 | DRG: 057 | Disposition: A | Discharge status: Home / Self Care | Source: Other Acute Inpatient Hospital | Attending: Physical Medicine & Rehabilitation | Admitting: Physical Medicine & Rehabilitation

## 2024-05-03 DIAGNOSIS — K219 Gastro-esophageal reflux disease without esophagitis: Secondary | ICD-10-CM | POA: Diagnosis present

## 2024-05-03 DIAGNOSIS — R531 Weakness: Secondary | ICD-10-CM | POA: Diagnosis not present

## 2024-05-03 DIAGNOSIS — Z961 Presence of intraocular lens: Secondary | ICD-10-CM | POA: Diagnosis present

## 2024-05-03 DIAGNOSIS — Z91040 Latex allergy status: Secondary | ICD-10-CM

## 2024-05-03 DIAGNOSIS — Z7902 Long term (current) use of antithrombotics/antiplatelets: Secondary | ICD-10-CM | POA: Diagnosis not present

## 2024-05-03 DIAGNOSIS — Z9842 Cataract extraction status, left eye: Secondary | ICD-10-CM

## 2024-05-03 DIAGNOSIS — I6381 Other cerebral infarction due to occlusion or stenosis of small artery: Secondary | ICD-10-CM | POA: Diagnosis present

## 2024-05-03 DIAGNOSIS — M19012 Primary osteoarthritis, left shoulder: Secondary | ICD-10-CM | POA: Diagnosis present

## 2024-05-03 DIAGNOSIS — E785 Hyperlipidemia, unspecified: Secondary | ICD-10-CM | POA: Diagnosis present

## 2024-05-03 DIAGNOSIS — G2581 Restless legs syndrome: Secondary | ICD-10-CM | POA: Diagnosis present

## 2024-05-03 DIAGNOSIS — I69322 Dysarthria following cerebral infarction: Secondary | ICD-10-CM

## 2024-05-03 DIAGNOSIS — Z79899 Other long term (current) drug therapy: Secondary | ICD-10-CM | POA: Diagnosis not present

## 2024-05-03 DIAGNOSIS — R739 Hyperglycemia, unspecified: Secondary | ICD-10-CM | POA: Diagnosis not present

## 2024-05-03 DIAGNOSIS — I10 Essential (primary) hypertension: Secondary | ICD-10-CM | POA: Diagnosis present

## 2024-05-03 DIAGNOSIS — K5900 Constipation, unspecified: Secondary | ICD-10-CM | POA: Diagnosis present

## 2024-05-03 DIAGNOSIS — Z885 Allergy status to narcotic agent status: Secondary | ICD-10-CM | POA: Diagnosis not present

## 2024-05-03 DIAGNOSIS — Z882 Allergy status to sulfonamides status: Secondary | ICD-10-CM | POA: Diagnosis not present

## 2024-05-03 DIAGNOSIS — Z880 Allergy status to penicillin: Secondary | ICD-10-CM | POA: Diagnosis not present

## 2024-05-03 DIAGNOSIS — Z9841 Cataract extraction status, right eye: Secondary | ICD-10-CM | POA: Diagnosis not present

## 2024-05-03 DIAGNOSIS — E119 Type 2 diabetes mellitus without complications: Secondary | ICD-10-CM | POA: Diagnosis not present

## 2024-05-03 DIAGNOSIS — M19011 Primary osteoarthritis, right shoulder: Secondary | ICD-10-CM | POA: Diagnosis present

## 2024-05-03 DIAGNOSIS — I639 Cerebral infarction, unspecified: Secondary | ICD-10-CM | POA: Diagnosis not present

## 2024-05-03 DIAGNOSIS — Z794 Long term (current) use of insulin: Secondary | ICD-10-CM | POA: Diagnosis not present

## 2024-05-03 DIAGNOSIS — Z789 Other specified health status: Secondary | ICD-10-CM | POA: Diagnosis not present

## 2024-05-03 DIAGNOSIS — J45909 Unspecified asthma, uncomplicated: Secondary | ICD-10-CM | POA: Diagnosis present

## 2024-05-03 DIAGNOSIS — K5901 Slow transit constipation: Secondary | ICD-10-CM | POA: Diagnosis not present

## 2024-05-03 DIAGNOSIS — I69354 Hemiplegia and hemiparesis following cerebral infarction affecting left non-dominant side: Principal | ICD-10-CM

## 2024-05-03 DIAGNOSIS — G510 Bell's palsy: Secondary | ICD-10-CM | POA: Diagnosis present

## 2024-05-03 HISTORY — DX: Other cerebral infarction due to occlusion or stenosis of small artery: I63.81

## 2024-05-03 LAB — GLUCOSE, CAPILLARY: Glucose-Capillary: 134 mg/dL — ABNORMAL HIGH (ref 70–99)

## 2024-05-03 MED ORDER — PREGABALIN 25 MG PO CAPS
25.0000 mg | ORAL_CAPSULE | Freq: Two times a day (BID) | ORAL | Status: DC
Start: 2024-05-03 — End: 2024-05-09
  Administered 2024-05-03 – 2024-05-09 (×12): 25 mg via ORAL
  Filled 2024-05-03 (×12): qty 1

## 2024-05-03 MED ORDER — ACETAMINOPHEN 325 MG PO TABS
650.0000 mg | ORAL_TABLET | ORAL | Status: DC | PRN
  Administered 2024-05-04: 650 mg via ORAL
  Filled 2024-05-03: qty 2

## 2024-05-03 MED ORDER — TRAZODONE HCL 50 MG PO TABS
50.0000 mg | ORAL_TABLET | Freq: Every evening | ORAL | Status: DC | PRN
Start: 2024-05-03 — End: 2024-05-09

## 2024-05-03 MED ORDER — CLOPIDOGREL BISULFATE 75 MG PO TABS: 75.0000 mg | ORAL_TABLET | Freq: Every day | ORAL | Status: DC

## 2024-05-03 MED ORDER — ASPIRIN 81 MG PO TBEC
81.0000 mg | DELAYED_RELEASE_TABLET | Freq: Every day | ORAL | Status: DC
  Administered 2024-05-04 – 2024-05-09 (×6): 81 mg via ORAL
  Filled 2024-05-03 (×6): qty 1

## 2024-05-03 MED ORDER — ACETAMINOPHEN 160 MG/5ML PO SOLN: 650.0000 mg | ORAL | Status: DC | PRN

## 2024-05-03 MED ORDER — METFORMIN HCL ER (OSM) 500 MG PO TB24: 500.0000 mg | ORAL_TABLET | Freq: Two times a day (BID) | ORAL | Status: DC

## 2024-05-03 MED ORDER — LOSARTAN POTASSIUM 50 MG PO TABS
50.0000 mg | ORAL_TABLET | Freq: Every day | ORAL | Status: DC
  Administered 2024-05-04 – 2024-05-09 (×6): 50 mg via ORAL
  Filled 2024-05-03 (×6): qty 1

## 2024-05-03 MED ORDER — PANTOPRAZOLE SODIUM 40 MG PO TBEC: 40.0000 mg | DELAYED_RELEASE_TABLET | Freq: Every day | ORAL | Status: DC

## 2024-05-03 MED ORDER — SENNOSIDES-DOCUSATE SODIUM 8.6-50 MG PO TABS: 1.0000 | ORAL_TABLET | Freq: Every evening | ORAL | Status: DC | PRN

## 2024-05-03 MED ORDER — ASPIRIN 81 MG PO TBEC: 81.0000 mg | DELAYED_RELEASE_TABLET | Freq: Every day | ORAL | Status: DC

## 2024-05-03 MED ORDER — PANTOPRAZOLE SODIUM 40 MG PO TBEC
40.0000 mg | DELAYED_RELEASE_TABLET | Freq: Every day | ORAL | Status: DC
Start: 2024-05-04 — End: 2024-05-09
  Administered 2024-05-04 – 2024-05-09 (×6): 40 mg via ORAL
  Filled 2024-05-03 (×6): qty 1

## 2024-05-03 MED ORDER — POLYETHYLENE GLYCOL 3350 17 G PO PACK
17.0000 g | PACK | Freq: Two times a day (BID) | ORAL | Status: DC
  Administered 2024-05-03 – 2024-05-08 (×8): 17 g via ORAL
  Filled 2024-05-03 (×12): qty 1

## 2024-05-03 MED ORDER — ATORVASTATIN CALCIUM 40 MG PO TABS: 40.0000 mg | ORAL_TABLET | Freq: Every day | ORAL | Status: DC

## 2024-05-03 MED ORDER — ATORVASTATIN CALCIUM 40 MG PO TABS
40.0000 mg | ORAL_TABLET | Freq: Every day | ORAL | Status: DC
  Administered 2024-05-03 – 2024-05-08 (×6): 40 mg via ORAL
  Filled 2024-05-03 (×6): qty 1

## 2024-05-03 MED ORDER — ORAL CARE MOUTH RINSE: 15.0000 mL | OROMUCOSAL | Status: DC | PRN

## 2024-05-03 MED ORDER — CLOPIDOGREL BISULFATE 75 MG PO TABS
75.0000 mg | ORAL_TABLET | Freq: Every day | ORAL | Status: DC
Start: 2024-05-04 — End: 2024-05-09
  Administered 2024-05-04 – 2024-05-09 (×6): 75 mg via ORAL
  Filled 2024-05-03 (×6): qty 1

## 2024-05-03 MED ORDER — ACETAMINOPHEN 650 MG RE SUPP: 650.0000 mg | RECTAL | Status: DC | PRN

## 2024-05-03 NOTE — Progress Notes (Signed)
   05/03/24 1745  Vitals  Temp 98 F (36.7 C)  Temp Source Oral  BP 116/89  BP Location Right Arm  BP Method Automatic  Patient Position (if appropriate) Lying  Pulse Rate 80  Pulse Rate Source Monitor  Resp 18  MEWS COLOR  MEWS Score Color Green  Oxygen Therapy  SpO2 98 %  O2 Device Room Air  Pain Assessment  Pain Scale 0-10  Pain Score 0   Pt arrived approx 540 in the evening, vitals take shortly after and vitals within range , patient stating excitement to start therapy. Went over safety protocols, put bed alarm on lowest setting, did assessment, went over patient binder, and answered questions patient had about the floor. Patient ordered food and is resting in bed.

## 2024-05-03 NOTE — Plan of Care (Signed)

## 2024-05-03 NOTE — Progress Notes (Addendum)
 Inpatient Rehab Admissions Coordinator:    I have insurance approval and a bed available for pt to admit to CIR today. Dr. Dezii in agreement and [facility] aware.  I will updated pt/family.  I will call for [facility] once room assigned.  RN can call [phone] for report.   1210: [facility] to pick up once ready (attempted to pick up at 1115 which was well before their estimate of after 12 and pt wasn't ready).  Going to room 4w24 at [facility].   Loye Rumble, PT, DPT Admissions Coordinator [phone] 05/03/24  10:21 AM

## 2024-05-03 NOTE — Progress Notes (Signed)
 Rawland Caddy, MD  Physician Physical Medicine and Rehabilitation   PMR Pre-admission     Signed   Date of Service: 05/03/2024 10:40 AM  Related encounter: ED to Hosp-Admission (Current) from 04/27/2024 in [facility] 1C MEDICAL TELEMETRY   Signed     Expand All Collapse All  PMR Admission Coordinator Pre-Admission Assessment   Patient: Anita Reilly is an 78 y.o., female MRN: [id number] DOB: 1946-11-01 Height: 5' 4 (162.6 cm) Weight: 79.6 kg   Insurance Information HMO:     PPO: yes     PCP:      IPA:      80/20:      OTHER:  PRIMARY: [employer] Medicare      Policy#: [id number]       Subscriber: pt CM Name: Genene Kennel      Phone#: [phone]     Fax#: [phone] Pre-Cert#: [id number]  auth for CIR from [facility] with [facility] Medicare with updates due to fax listed above on 6/19      Employer:  Benefits:  Phone #: [phone]     Name:  Eff. Date: 11/22/23     Deduct: $0      Out of Pocket Max: $750 ($5 met)      Life Max:  CIR: $250/admit      SNF: 20 full days Outpatient:      Co-Pay: $20/visit Home Health: 100%      Co-Pay:  DME:      Co-Pay: $20-$30/ each medicare covered benefit Providers:  SECONDARY:       Policy#:      Phone#:    Artist:       Phone#:    The "Data Collection Information Summary" for patients in Inpatient Rehabilitation Facilities with attached "Privacy Act Statement-Health Care Records" was provided and verbally reviewed with: Patient and Family   Emergency Contact Information Contact Information   None on File      Other Contacts       Name Relation Home Work Mobile    Camp Three Son [phone]   [phone]    Reagan Camera Daughter [phone]   [phone]    Berdina, Cheever Daughter     [phone]           Current Medical History  Patient Admitting Diagnosis: CVA   History of Present Illness: Pt is a 78 y/o female with PMH of Bell's Palsy, HTN, HLD, GERD, asthma who presented to [facility]  on 04/27/24 with stroke like symptoms while at a wedding.  Pt reports possible syncopal episode, but not sure.  In ED, vitals WNL, pt with ongoing L hemiparesis, labs unremarkable.  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  She did receive TNK.  Stroke workup was completed and neurology recommended aspirin  and plavix  x 3 weeks, then plavix  alone.  Therapy evaluations were completed and pt was recommended for CIR.     Complete NIHSS TOTAL: 1   Patient's medical record from [facility] has been reviewed by the rehabilitation admission coordinator and physician.   Past Medical History      Past Medical History:  Diagnosis Date   Arthritis      shoulders   Bell's palsy 2016   Vertigo      none in several years          Has the patient had major surgery  during 100 days prior to admission? No   Family History   family history is not on file.   Current Medications  Current Medications    Current Facility-Administered Medications:    acetaminophen  (TYLENOL ) tablet 650 mg, 650 mg, Oral, Q4H PRN, 650 mg at 05/01/24 1949 **OR** acetaminophen  (TYLENOL ) 160 MG/5ML solution 650 mg, 650 mg, Per Tube, Q4H PRN **OR** acetaminophen  (TYLENOL ) suppository 650 mg, 650 mg, Rectal, Q4H PRN, Ouma, Phoebe Breed, NP   aspirin  EC tablet 81 mg, 81 mg, Oral, Daily, Augustin Leber, MD, 81 mg at 05/03/24 1610   atorvastatin  (LIPITOR ) tablet 40 mg, 40 mg, Oral, QHS, Augustin Leber, MD, 40 mg at 05/02/24 2154   [COMPLETED] clopidogrel  (PLAVIX ) tablet 300 mg, 300 mg, Oral, Once, 300 mg at 04/29/24 1146 **AND** clopidogrel  (PLAVIX ) tablet 75 mg, 75 mg, Oral, Daily, Augustin Leber, MD, 75 mg at 05/03/24 9604   losartan  (COZAAR ) tablet 50 mg, 50 mg, Oral, Daily, Agbata, Tochukwu, MD, 50 mg at 05/03/24 0906   Oral care mouth rinse, 15 mL, Mouth Rinse, PRN, Kasa,  Kurian, MD   pantoprazole  (PROTONIX ) EC tablet 40 mg, 40 mg, Oral, Daily, Nazari, Walid A, RPH, 40 mg at 05/03/24 0905   polyethylene glycol (MIRALAX  / GLYCOLAX ) packet 17 g, 17 g, Oral, BID, Dezii, Alexandra, DO, 17 g at 05/03/24 0907   pregabalin  (LYRICA ) capsule 25 mg, 25 mg, Oral, BID, Agbata, Tochukwu, MD, 25 mg at 05/03/24 0907   senna-docusate (Senokot-S) tablet 1 tablet, 1 tablet, Oral, QHS PRN, Ouma, Elizabeth Achieng, NP, 1 tablet at 05/01/24 2239   sodium chloride  flush (NS) 0.9 % injection 3 mL, 3 mL, Intravenous, Once, Bradler, Evan K, MD   sodium chloride  flush (NS) 0.9 % injection 3-10 mL, 3-10 mL, Intravenous, Q12H, Ouma, Phoebe Breed, NP, 5 mL at 05/03/24 0907   sodium chloride  flush (NS) 0.9 % injection 3-10 mL, 3-10 mL, Intravenous, PRN, Ouma, Phoebe Breed, NP   traZODone  (DESYREL ) tablet 50 mg, 50 mg, Oral, QHS PRN, Ouma, Phoebe Breed, NP     Patients Current Diet:  Diet Order                  Diet regular Room service appropriate? Yes with Assist; Fluid consistency: Thin  Diet effective now                         Precautions / Restrictions Precautions Precautions: Fall Precaution/Restrictions Comments: L sided ataxia Restrictions Weight Bearing Restrictions Per Provider Order: No    Has the patient had 2 or more falls or a fall with injury in the past year? No   Prior Activity Level Community (5-7x/wk): fully independent, no DME, driving, managing her home/yard without assist   Prior Functional Level Self Care: Did the patient need help bathing, dressing, using the toilet or eating? Independent   Indoor Mobility: Did the patient need assistance with walking from room to room (with or without device)? Independent   Stairs: Did the patient need assistance with internal or external stairs (with or without device)? Independent   Functional Cognition: Did the patient need help planning regular tasks such as shopping or remembering to take  medications? Independent   Patient Information Are you of Hispanic, Latino/a,or Spanish origin?: A. No, not of Hispanic, Latino/a, or Spanish origin What is your race?: A. White Do you need or want an interpreter to communicate with a doctor or health care staff?: 0. No   Patient's  Response To:  Health Literacy and Transportation Is the patient able to respond to health literacy and transportation needs?: Yes Health Literacy - How often do you need to have someone help you when you read instructions, pamphlets, or other written material from your doctor or pharmacy?: Never In the past 12 months, has lack of transportation kept you from medical appointments or from getting medications?: No In the past 12 months, has lack of transportation kept you from meetings, work, or from getting things needed for daily living?: No   Home Assistive Devices / Equipment Home Equipment: None   Prior Device Use: Indicate devices/aids used by the patient prior to current illness, exacerbation or injury? None of the above   Current Functional Level Cognition   Orientation Level: Oriented X4    Extremity Assessment (includes Sensation/Coordination)   Upper Extremity Assessment: Overall WFL for tasks assessed RUE Deficits / Details: 4/5 grossly (limited ROM due to IV site discomfort) LUE Deficits / Details: sensation WFL, strength 3+/5 MMT LUE Sensation: decreased proprioception LUE Coordination: decreased gross motor  Lower Extremity Assessment: Generalized weakness LLE Deficits / Details: difficulty with sustained motor control during open chain movements LLE Coordination: decreased gross motor     ADLs   Overall ADL's : Needs assistance/impaired Eating/Feeding: Sitting, Set up Grooming: Sitting, Set up Upper Body Bathing: Sitting, Minimal assistance Lower Body Bathing: Sit to/from stand, Sitting/lateral leans, Maximal assistance Upper Body Dressing : Sitting, Contact guard assist Lower Body  Dressing: Sit to/from stand, Sitting/lateral leans, Maximal assistance Lower Body Dressing Details (indicate cue type and reason): maxA to doff socks and on shoes Toilet Transfer: +2 for physical assistance, +2 for safety/equipment, Minimal assistance, BSC/3in1, Rolling walker (2 wheels) Toilet Transfer Details (indicate cue type and reason): clinical judgement bed > recliner. anticipate pt will require BSC for safe transfers until LLE axaxia improves Toileting- Clothing Manipulation and Hygiene: Maximal assistance, Sit to/from stand Functional mobility during ADLs: Cueing for safety, Cueing for sequencing, +2 for physical assistance, Minimal assistance General ADL Comments: MIN A + RW for ADL t/f ~30 ft, decreasing to MOD A  as pt fatgiues for ~30 ft - assist to facilitate L hip flexion without excessive R trunk lean. MAX A don/doff L sock in standing. SBA standing grooming tasks - 2 lateral LOBs, self corrects with UE support.     Mobility   Overal bed mobility: Modified Independent Bed Mobility: Supine to Sit Supine to sit: Supervision General bed mobility comments: Not tested-pt received in chair     Transfers   Overall transfer level: Needs assistance Equipment used: None Transfers: Sit to/from Stand Sit to Stand: Contact guard assist General transfer comment: VC's for pushing off bed before grabbing RW     Ambulation / Gait / Stairs / Wheelchair Mobility   Ambulation/Gait Ambulation/Gait assistance: Editor, commissioning (Feet): 200 Feet Assistive device: Rolling walker (2 wheels) Gait Pattern/deviations: Step-to pattern, Ataxic, Decreased stance time - left, Decreased step length - right General Gait Details: Heavy multi-modal cuing for proper gait sequencing with RW. Throughout gait activity, VC's were repeated for proper gait sequencing. Pt able to make effortful steps with LLE clearing ground with multiple VC's. Pt completed 10 foot walk test in 1 minute and 18.53 seconds. Pt  ambulated 200 feet in 26 minutes. Gait velocity: 0.127 ft/sec Gait velocity interpretation: <1.31 ft/sec, indicative of household ambulator     Posture / Balance Dynamic Sitting Balance Sitting balance - Comments: increased posterior bias with fatigue, requiring cueing for  correct posture; able to maintain with BUE support on RW Balance Overall balance assessment: Needs assistance Sitting-balance support: Feet supported, Bilateral upper extremity supported Sitting balance-Leahy Scale: Good Sitting balance - Comments: increased posterior bias with fatigue, requiring cueing for correct posture; able to maintain with BUE support on RW Standing balance support: Single extremity supported, During functional activity Standing balance-Leahy Scale: Fair Standing balance comment: Occasional min A for stability in standing primarily to prevent posterior LOB     Special needs/care consideration Diabetic management A1C 6.2    Previous Home Environment (from acute therapy documentation) Living Arrangements: Alone Available Help at Discharge: Family, Available PRN/intermittently Type of Home: House Home Layout: Two level, Able to live on main level with bedroom/bathroom Alternate Level Stairs-Rails: Right Alternate Level Stairs-Number of Steps: 6, landing and 6+ Home Access: Stairs to enter, Ramped entrance Entrance Stairs-Rails: None Entrance Stairs-Number of Steps: 1 STE after ramp Bathroom Shower/Tub: Tub/shower unit, Health visitor: Standard   Discharge Living Setting Plans for Discharge Living Setting: Patient's home, Lives with (comment) (family to stay with her) Type of Home at Discharge: House Discharge Home Layout: Able to live on main level with bedroom/bathroom Discharge Home Access: Ramped entrance Discharge Bathroom Shower/Tub: Tub/shower unit, Walk-in shower Discharge Bathroom Toilet: Standard Discharge Bathroom Accessibility: Yes How Accessible: Accessible via  walker Does the patient have any problems obtaining your medications?: No   Social/Family/Support Systems Anticipated Caregiver: children, grand children, son, Myrtie Atkinson is primary contact Anticipated Caregiver's Contact Information: [phone] Ability/Limitations of Caregiver: none stated Caregiver Availability: 24/7 Discharge Plan Discussed with Primary Caregiver: Yes Is Caregiver In Agreement with Plan?: Yes   Goals Patient/Family Goal for Rehab: PT/OT/SLP supervision Expected length of stay: 10-14 days Additional Information: Discharge plan: return to pt's home, family will stay with her to ensure 24/7 supervision Pt/Family Agrees to Admission and willing to participate: Yes Program Orientation Provided & Reviewed with Pt/Caregiver Including Roles  & Responsibilities: Yes   Decrease burden of Care through IP rehab admission: n/a   Possible need for SNF placement upon discharge: Not anticipated.  Plan to discharge back to pt's home and family will stay with her for 24/7 supervision at discharge from CIR.    Patient Condition: I have reviewed medical records from [facility], spoken with CM, and patient and son. I discussed via phone for inpatient rehabilitation assessment.  Patient will benefit from ongoing PT, OT, and SLP, can actively participate in 3 hours of therapy a day 5 days of the week, and can make measurable gains during the admission.  Patient will also benefit from the coordinated team approach during an Inpatient Acute Rehabilitation admission.  The patient will receive intensive therapy as well as Rehabilitation physician, nursing, social worker, and care management interventions.  Due to safety, skin/wound care, disease management, medication administration, pain management, and patient education the patient requires 24 hour a day rehabilitation nursing.  The patient is currently min assist with mobility and basic ADLs.  Discharge setting and therapy post discharge at home with home  health is anticipated.  Patient has agreed to participate in the Acute Inpatient Rehabilitation Program and will admit today.   Preadmission Screen Completed By:  Owens Hara E Leasia Swann,PT, DPT 05/03/2024 10:39 AM ______________________________________________________________________   Discussed status with Dr. Rachel Budds on 05/03/24  at 10:39 AM  and received approval for admission today.   Admission Coordinator:  Carlen Rebuck E Shatima Zalar, PT, DPT time 10:39 AM Alanna Hu 05/03/24     Assessment/Plan: Diagnosis: brainstem infarct Does the need for  close, 24 hr/day Medical supervision in concert with the patient's rehab needs make it unreasonable for this patient to be served in a less intensive setting? Yes Co-Morbidities requiring supervision/potential complications: HTN, Bell's palsy, asthma, post-stroke sequelae Due to bladder management, bowel management, safety, skin/wound care, disease management, medication administration, pain management, and patient education, does the patient require 24 hr/day rehab nursing? Yes Does the patient require coordinated care of a physician, rehab nurse, PT, OT, and SLP to address physical and functional deficits in the context of the above medical diagnosis(es)? Yes Addressing deficits in the following areas: balance, endurance, locomotion, strength, transferring, bowel/bladder control, bathing, dressing, feeding, grooming, toileting, cognition, speech, and psychosocial support Can the patient actively participate in an intensive therapy program of at least 3 hrs of therapy 5 days a week? Yes The potential for patient to make measurable gains while on inpatient rehab is excellent Anticipated functional outcomes upon discharge from inpatient rehab: supervision PT, supervision OT, supervision SLP Estimated rehab length of stay to reach the above functional goals is: 10-14 days Anticipated discharge destination: Home 10. Overall Rehab/Functional Prognosis: excellent     MD  Signature: Rawland Caddy, MD, [facility] Physical Medicine & Rehabilitation Medical Director Rehabilitation Services 05/03/2024           Revision History

## 2024-05-03 NOTE — Discharge Summary (Signed)
 Physician Discharge Summary   Patient: Anita Reilly MRN: [id number] DOB: 1946-07-19  Admit date:     04/27/2024  Discharge date:  05/03/24   Discharge Physician: Roise Cleaver   PCP: Jimmy Moulding, MD   Recommendations at discharge:    Follow up with Neurology  See PCP to discuss chronic medication management  Discharge Diagnoses: Principal Problem:   Acute stroke due to ischemia [facility])  Resolved Problems:   * No resolved hospital problems. *  Hospital Course: Anita Reilly is 78 year old female with hypertension, hyperlipidemia, restless leg syndrome, GERD, asthma, depression, history of right face Bell's palsy, presented to the ED as a code stroke.  In the ED vital signs were stable.  Stat head CT and CTA without LVO.  After teleneurology consultation decision was made to proceed with TNK at 6/7 at 2237.  Patient did well status post TNK and worked with physical therapy/Occupational Therapy.  Stay has been delayed pending rehab placement. Patient discharging directly to CIR today.   Acute CVA - Status post TNK - Brain MRI ultimately negative but has remained symptomatic.  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  If she is not able to tolerate this we will need to consider PCSK9 inhibitor - Continue with aspirin  and Plavix  for 3 weeks then Plavix  monotherapy - Normotensive blood pressure goals - Continue PT/OT with CIR team   Diabetes - HgbA1c 6.2  -- Start Metformin 500mg  BID at DC, titrate up as tolerated.  - No need for insulin at this time - Outpatient follow-up with PCP   Hypertension - Blood pressure at goal.  Trend closely.  Continue current medications    Right-sided facial paralysis, Bell's palsy - Prior to arrival - Continue  Lyrica    AKI, resolved - Baseline creatinine 1.0, creatinine on admission 1.6. - Monitor daily CMP - Creatinine clearance 46  BMI 30 - Outpatient follow up for lifestyle modification and risk factor management       Consultants: Neurology Procedures performed: n/a  Disposition: Rehabilitation facility Diet recommendation:  Discharge Diet Orders (From admission, onward)     Start     Ordered   05/03/24 0000  Diet general        05/03/24 1142           Cardiac and Carb modified diet DISCHARGE MEDICATION: Allergies as of 05/03/2024       Reactions   Latex    Dental gloves caused lip swelling   Sulfa Antibiotics Swelling   lips   Tylox [oxycodone-acetaminophen ]    Penicillins Rash   Veetids caused rash        Medication List     STOP taking these medications    desipramine 10 MG tablet Commonly known as: NOPRAMIN       TAKE these medications    aspirin  EC 81 MG tablet Take 1 tablet (81 mg total) by mouth daily. Swallow whole. Start taking on: May 04, 2024   atorvastatin  40 MG tablet Commonly known as: LIPITOR  Take 1 tablet (40 mg total) by mouth at bedtime.   CALTRATE 600+D PO Take 1 tablet by mouth daily.   clopidogrel  75 MG tablet Commonly known as: PLAVIX  Take 1 tablet (75 mg total) by mouth daily. Start taking on: May 04, 2024  losartan  50 MG tablet Commonly known as: COZAAR  Take 50 mg by mouth daily.   pantoprazole  40 MG tablet Commonly known as: PROTONIX  Take 1 tablet (40 mg total) by mouth daily. Start taking on: May 04, 2024   pregabalin  25 MG capsule Commonly known as: LYRICA  Take 1 capsule by mouth 2 (two) times daily.        Follow-up Information     Jimmy Moulding, MD Follow up.   Specialty: Internal Medicine Why: hospital follow up Contact information: [address] [facility] [place] [address] [phone]                Discharge Exam: Anita Reilly Weights   04/27/24  2157 04/27/24 2213  Weight: 79.6 kg 79.6 kg   General exam: Appears calm and comfortable, NAD  Respiratory system: No work of breathing, symmetric chest wall expansion Cardiovascular system: S1 & S2 heard, RRR.  Gastrointestinal system: Abdomen is nondistended, soft and nontender.  Neuro: Alert and oriented. Extremities: Symmetric, expected ROM Skin: No rashes, lesions Psychiatry: Demonstrates appropriate judgement and insight. Mood & affect appropriate for situation.   Condition at discharge: stable  The results of significant diagnostics from this hospitalization (including imaging, microbiology, ancillary and laboratory) are listed below for reference.   Imaging Studies: CT HEAD WO CONTRAST ( ) Result Date: 04/28/2024 CLINICAL DATA:  Stroke follow-up EXAM: CT HEAD WITHOUT CONTRAST TECHNIQUE: Contiguous axial images were obtained from the base of the skull through the vertex without intravenous contrast. RADIATION DOSE REDUCTION: This exam was performed according to the departmental dose-optimization program which includes automated exposure control, adjustment of the mA and/or kV according to patient size and/or use of iterative reconstruction technique. COMPARISON:  None Available. FINDINGS: Brain: There is no mass, hemorrhage or extra-axial collection. There is generalized atrophy without lobar predilection. Hypodensity of the white matter is most commonly associated with chronic microvascular disease. Vascular: No hyperdense vessel or unexpected vascular calcification. Skull: The visualized skull base, calvarium and extracranial soft tissues are normal. Sinuses/Orbits: No fluid levels or advanced mucosal thickening of the visualized paranasal sinuses. No mastoid or middle ear effusion. Normal orbits. Other: None. IMPRESSION: 1. No acute intracranial abnormality. 2. Generalized atrophy and findings of chronic microvascular disease. Electronically Signed   By: Juanetta Nordmann M.D.   On:  04/28/2024 23:10   ECHOCARDIOGRAM COMPLETE Result Date: 04/28/2024    ECHOCARDIOGRAM REPORT   Patient Name:   Anita Reilly Date of Exam: 04/28/2024 Medical Rec #:  [id number]            Height:       64.0 in Accession #:    [id number]           Weight:       175.5 lb Date of Birth:  25-Oct-1946             BSA:          1.851 m Patient Age:    77 years             BP:           122/69 mmHg Patient Gender: F                    HR:           76 bpm. Exam Location:  [facility] Procedure: 2D Echo, Cardiac Doppler, Color Doppler and Intracardiac            Opacification Agent (Both Spectral  and Color Flow Doppler were            utilized during procedure). Indications:     Stroke I63.9  History:         Patient has no prior history of Echocardiogram examinations.  Sonographer:     Clenton Czech RDCS, FASE Referring Phys:  [id number] Phoebe Breed OUMA Diagnosing Phys: Gaylyn Keas MD  Sonographer Comments: Technically difficult study due to poor echo windows, suboptimal parasternal window and suboptimal apical window. Image acquisition challenging due to respiratory motion. IMPRESSIONS  1. Left ventricular ejection fraction, by estimation, is 65 to 70%. The left ventricle has normal function. The left ventricle has no regional wall motion abnormalities. Left ventricular diastolic parameters are consistent with Grade I diastolic dysfunction (impaired relaxation).  2. Right ventricular systolic function is normal. The right ventricular size is normal.  3. The mitral valve is normal in structure. No evidence of mitral valve regurgitation. No evidence of mitral stenosis.  4. The aortic valve is normal in structure. Aortic valve regurgitation is not visualized. No aortic stenosis is present. Conclusion(s)/Recommendation(s): No intracardiac source of embolism detected on this transthoracic study. Consider a transesophageal echocardiogram to exclude cardiac source of embolism if clinically indicated. FINDINGS  Left Ventricle:  Left ventricular ejection fraction, by estimation, is 65 to 70%. The left ventricle has normal function. The left ventricle has no regional wall motion abnormalities. Definity  contrast agent was given IV to delineate the left ventricular  endocardial borders. The left ventricular internal cavity size was normal in size. There is no left ventricular hypertrophy. Left ventricular diastolic parameters are consistent with Grade I diastolic dysfunction (impaired relaxation). Normal left ventricular filling pressure. Right Ventricle: The right ventricular size is normal. No increase in right ventricular wall thickness. Right ventricular systolic function is normal. Left Atrium: Left atrial size was normal in size. Right Atrium: Right atrial size was normal in size. Pericardium: There is no evidence of pericardial effusion. Mitral Valve: The mitral valve is normal in structure. No evidence of mitral valve regurgitation. No evidence of mitral valve stenosis. Tricuspid Valve: The tricuspid valve is normal in structure. Tricuspid valve regurgitation is not demonstrated. No evidence of tricuspid stenosis. Aortic Valve: The aortic valve is normal in structure. Aortic valve regurgitation is not visualized. No aortic stenosis is present. Aortic valve peak gradient measures 10.8 mmHg. Pulmonic Valve: The pulmonic valve was normal in structure. Pulmonic valve regurgitation is not visualized. No evidence of pulmonic stenosis. Aorta: The aortic root is normal in size and structure. Venous: The inferior vena cava was not well visualized. IAS/Shunts: The interatrial septum appears to be lipomatous. No atrial level shunt detected by color flow Doppler.  LEFT VENTRICLE PLAX 2D LVIDd:         3.90 cm     Diastology LVIDs:         2.80 cm     LV e' medial:    6.31 cm/s LV PW:         1.00 cm     LV E/e' medial:  8.4 LV IVS:        0.70 cm     LV e' lateral:   6.85 cm/s LVOT diam:     1.80 cm     LV E/e' lateral: 7.7 LV SV:         68 LV SV  Index:   37 LVOT Area:     2.54 cm  LV Volumes (MOD) LV vol d, MOD A2C: 48.0 ml LV  vol d, MOD A4C: 56.3 ml LV vol s, MOD A2C: 17.9 ml LV vol s, MOD A4C: 20.0 ml LV SV MOD A2C:     30.1 ml LV SV MOD A4C:     56.3 ml LV SV MOD BP:      34.0 ml RIGHT VENTRICLE RV Basal diam:  2.40 cm LEFT ATRIUM           Index        RIGHT ATRIUM          Index LA diam:      3.20 cm 1.73 cm/m   RA Area:     7.41 cm LA Vol (A4C): 22.0 ml 11.89 ml/m  RA Volume:   10.30 ml 5.57 ml/m  AORTIC VALVE                 PULMONIC VALVE AV Area (Vmax): 2.00 cm     PV Vmax:        1.24 m/s AV Vmax:        164.00 cm/s  PV Peak grad:   6.2 mmHg AV Peak Grad:   10.8 mmHg    RVOT Peak grad: 4 mmHg LVOT Vmax:      129.00 cm/s LVOT Vmean:     90.700 cm/s LVOT VTI:       0.266 m  AORTA Ao Root diam: 2.90 cm MITRAL VALVE MV Area (PHT): 2.37 cm    SHUNTS MV Decel Time: 320 msec    Systemic VTI:  0.27 m MV E velocity: 52.90 cm/s  Systemic Diam: 1.80 cm MV A velocity: 93.80 cm/s MV E/A ratio:  0.56 Gaylyn Keas MD Electronically signed by Gaylyn Keas MD Signature Date/Time: 04/28/2024/5:10:14 PM    Final    MR BRAIN WO CONTRAST Result Date: 04/28/2024 CLINICAL DATA:  Stroke follow-up.  Left facial droop EXAM: MRI HEAD WITHOUT CONTRAST TECHNIQUE: Multiplanar, multiecho pulse sequences of the brain and surrounding structures were obtained without intravenous contrast. COMPARISON:  08/16/2012 FINDINGS: Brain: No acute infarct, mass effect or extra-axial collection. No acute or chronic hemorrhage. There is multifocal hyperintense T2-weighted signal within the white matter. Parenchymal volume and CSF spaces are normal. The midline structures are normal. Vascular: Normal flow voids. Skull and upper cervical spine: Normal calvarium and skull base. Visualized upper cervical spine and soft tissues are normal. Sinuses/Orbits:No paranasal sinus fluid levels or advanced mucosal thickening. No mastoid or middle ear effusion. Normal orbits. IMPRESSION: 1. No acute  intracranial abnormality. 2. Chronic microangiopathic white matter changes. Electronically Signed   By: Juanetta Nordmann M.D.   On: 04/28/2024 00:48   CT ANGIO HEAD NECK W WO CM W PERF (CODE STROKE) Result Date: 04/27/2024 CLINICAL DATA:  Acute neurologic deficit EXAM: CT ANGIOGRAPHY HEAD AND NECK CT PERFUSION BRAIN TECHNIQUE: Multidetector CT imaging of the head and neck was performed using the standard protocol during bolus administration of intravenous contrast. Multiplanar CT image reconstructions and MIPs were obtained to evaluate the vascular anatomy. Carotid stenosis measurements (when applicable) are obtained utilizing NASCET criteria, using the distal internal carotid diameter as the denominator. Multiphase CT imaging of the brain was performed following IV bolus contrast injection. Subsequent parametric perfusion maps were calculated using RAPID software. RADIATION DOSE REDUCTION: This exam was performed according to the departmental dose-optimization program which includes automated exposure control, adjustment of the mA and/or kV according to patient size and/or use of iterative reconstruction technique. CONTRAST:  OMNIPAQUE  IOHEXOL  350 MG/ML SOLN COMPARISON:  Head CT 04/27/2024 FINDINGS: CTA  NECK FINDINGS Skeleton: No acute abnormality or high grade bony spinal canal stenosis. Other neck: Normal pharynx, larynx and major salivary glands. No cervical lymphadenopathy. Unremarkable thyroid gland. Upper chest: No pneumothorax or pleural effusion. No nodules or masses. Aortic arch: There is calcific atherosclerosis of the aortic arch. Conventional 3 vessel aortic branching pattern. RIGHT carotid system: Normal without aneurysm, dissection or stenosis. LEFT carotid system: Normal without aneurysm, dissection or stenosis. Vertebral arteries: Codominant configuration. There is no dissection, occlusion or flow-limiting stenosis to the skull base (V1-V3 segments). CTA HEAD FINDINGS POSTERIOR CIRCULATION:  Vertebral arteries are normal. No proximal occlusion of the anterior or inferior cerebellar arteries. Basilar artery is normal. Superior cerebellar arteries are normal. Posterior cerebral arteries are normal. ANTERIOR CIRCULATION: Intracranial internal carotid arteries are normal. Anterior cerebral arteries are normal. Middle cerebral arteries are normal. Venous sinuses: As permitted by contrast timing, patent. Anatomic variants: None Review of the MIP images confirms the above findings. CT Brain Perfusion Findings: ASPECTS: 10 CBF (<30%) Volume: 0mL Perfusion (Tmax>6.0s) volume: 0mL Mismatch Volume: 0mL Infarction Location:None IMPRESSION: 1. No emergent large vessel occlusion or high-grade stenosis of the intracranial arteries. 2. Normal CT perfusion. Aortic Atherosclerosis (ICD10-I70.0). Electronically Signed   By: Juanetta Nordmann M.D.   On: 04/27/2024 22:29   CT HEAD CODE STROKE WO CONTRAST Result Date: 04/27/2024 CLINICAL DATA:  Code stroke. Neuro deficit, concern for stroke, left-sided facial weakness, slurred speech. EXAM: CT HEAD WITHOUT CONTRAST TECHNIQUE: Contiguous axial images were obtained from the base of the skull through the vertex without intravenous contrast. RADIATION DOSE REDUCTION: This exam was performed according to the departmental dose-optimization program which includes automated exposure control, adjustment of the mA and/or kV according to patient size and/or use of iterative reconstruction technique. COMPARISON:  MRI head 08/16/2012. FINDINGS: Brain: No acute intracranial hemorrhage. No CT evidence of acute infarct. No edema, mass effect, or midline shift. The basilar cisterns are patent. Ventricles: The ventricles are normal. Vascular: Atherosclerotic calcifications of the carotid siphons and intracranial vertebral arteries. No hyperdense vessel. Skull: No acute or aggressive finding. Orbits: Bilateral lens replacement. Sinuses: The visualized paranasal sinuses are clear. Other: Mastoid  air cells are clear. ASPECTS [facility] Stroke Program Early CT Score) - Ganglionic level infarction (caudate, lentiform nuclei, internal capsule, insula, M1-M3 cortex): 7 - Supraganglionic infarction (M4-M6 cortex): 3 Total score (0-10 with 10 being normal): 10 IMPRESSION: 1. No CT evidence of acute intracranial abnormality. 2. ASPECTS is 10 These results were called by telephone at the time of interpretation on 04/27/2024 at 9:55 pm to provider [facility] , who verbally acknowledged these results. Electronically Signed   By: Denny Flack M.D.   On: 04/27/2024 21:55    Microbiology: Results for orders placed or performed during the hospital encounter of 04/27/24  MRSA Next Gen by PCR, Nasal     Status: None   Collection Time: 04/28/24 12:56 AM   Specimen: Nasal Mucosa; Nasal Swab  Result Value Ref Range Status   MRSA by PCR Next Gen NOT DETECTED NOT DETECTED Final    Comment: (NOTE) The GeneXpert MRSA Assay (FDA approved for NASAL specimens only), is one component of a comprehensive MRSA colonization surveillance program. It is not intended to diagnose MRSA infection nor to guide or monitor treatment for MRSA infections. Test performance is not FDA approved in patients less than 22 years old. Performed at [facility], [address] [place], [place] [address]     Labs: CBC: Recent Labs  Lab 04/27/24 2140 04/29/24  0222 05/02/24 0534  WBC 7.9 6.0 6.3  NEUTROABS 4.5 2.6  --   HGB 14.3 13.6 13.2  HCT 41.6 40.2 38.2  MCV 91.6 92.4 90.1  PLT 289 255 252   Basic Metabolic Panel: Recent Labs  Lab 04/27/24 2140 04/29/24 0222 05/02/24 0534  NA 136 136 134*  K 3.8 4.5 4.5  CL 100 103 103  CO2 26 27 22   GLUCOSE 145* 110* 108*  BUN 28* 20 20  CREATININE 1.61* 1.06* 1.01*  CALCIUM  10.4* 9.2 8.9  MG  --  2.1  --   PHOS  --  4.0  --    Liver Function Tests: Recent Labs  Lab 04/27/24 2140 05/02/24 0534  AST 26 20  ALT 19 13  ALKPHOS 77 81  BILITOT 0.7 1.2  PROT  7.8 6.7  ALBUMIN 4.1 3.5   CBG: Recent Labs  Lab 04/27/24 2140 04/28/24 0046  GLUCAP 132* 128*    Discharge time spent: 32 minutes.  Signed: Cuca Benassi, DO [facility] 05/03/2024

## 2024-05-03 NOTE — Plan of Care (Signed)
 Problem: Education: Goal: Knowledge of disease or condition will improve 05/03/2024 0517 by Aureliano Blow, RN Outcome: Progressing 05/03/2024 0515 by Aureliano Blow, RN Outcome: Progressing Goal: Knowledge of secondary prevention will improve (MUST DOCUMENT ALL) 05/03/2024 0517 by Aureliano Blow, RN Outcome: Progressing 05/03/2024 0515 by Aureliano Blow, RN Outcome: Progressing Goal: Knowledge of patient specific risk factors will improve (DELETE if not current risk factor) 05/03/2024 0517 by Aureliano Blow, RN Outcome: Progressing 05/03/2024 0515 by Aureliano Blow, RN Outcome: Progressing   Problem: Ischemic Stroke/TIA Tissue Perfusion: Goal: Complications of ischemic stroke/TIA will be minimized 05/03/2024 0517 by Aureliano Blow, RN Outcome: Progressing 05/03/2024 0515 by Aureliano Blow, RN Outcome: Progressing   Problem: Coping: Goal: Will verbalize positive feelings about self 05/03/2024 0517 by Aureliano Blow, RN Outcome: Progressing 05/03/2024 0515 by Aureliano Blow, RN Outcome: Progressing Goal: Will identify appropriate support needs 05/03/2024 0517 by Aureliano Blow, RN Outcome: Progressing 05/03/2024 0515 by Aureliano Blow, RN Outcome: Progressing   Problem: Health Behavior/Discharge Planning: Goal: Ability to manage health-related needs will improve 05/03/2024 0517 by Aureliano Blow, RN Outcome: Progressing 05/03/2024 0515 by Aureliano Blow, RN Outcome: Progressing Goal: Goals will be collaboratively established with patient/family 05/03/2024 0517 by Aureliano Blow, RN Outcome: Progressing 05/03/2024 0515 by Aureliano Blow, RN Outcome: Progressing   Problem: Self-Care: Goal: Ability to participate in self-care as condition permits will improve 05/03/2024 0517 by Aureliano Blow, RN Outcome: Progressing 05/03/2024 0515 by Aureliano Blow, RN Outcome: Progressing Goal: Verbalization of feelings and concerns over difficulty with self-care will improve 05/03/2024 0517 by Aureliano Blow, RN Outcome: Progressing 05/03/2024 0515 by Aureliano Blow,  RN Outcome: Progressing Goal: Ability to communicate needs accurately will improve 05/03/2024 0517 by Aureliano Blow, RN Outcome: Progressing 05/03/2024 0515 by Aureliano Blow, RN Outcome: Progressing   Problem: Nutrition: Goal: Risk of aspiration will decrease 05/03/2024 0517 by Aureliano Blow, RN Outcome: Progressing 05/03/2024 0515 by Aureliano Blow, RN Outcome: Progressing Goal: Dietary intake will improve 05/03/2024 0517 by Aureliano Blow, RN Outcome: Progressing 05/03/2024 0515 by Aureliano Blow, RN Outcome: Progressing   Problem: Education: Goal: Knowledge of General Education information will improve Description: Including pain rating scale, medication(s)/side effects and non-pharmacologic comfort measures 05/03/2024 0517 by Aureliano Blow, RN Outcome: Progressing 05/03/2024 0515 by Aureliano Blow, RN Outcome: Progressing   Problem: Health Behavior/Discharge Planning: Goal: Ability to manage health-related needs will improve 05/03/2024 0517 by Aureliano Blow, RN Outcome: Progressing 05/03/2024 0515 by Aureliano Blow, RN Outcome: Progressing   Problem: Clinical Measurements: Goal: Ability to maintain clinical measurements within normal limits will improve 05/03/2024 0517 by Aureliano Blow, RN Outcome: Progressing 05/03/2024 0515 by Aureliano Blow, RN Outcome: Progressing Goal: Will remain free from infection 05/03/2024 0517 by Aureliano Blow, RN Outcome: Progressing 05/03/2024 0515 by Aureliano Blow, RN Outcome: Progressing Goal: Diagnostic test results will improve 05/03/2024 0517 by Aureliano Blow, RN Outcome: Progressing 05/03/2024 0515 by Aureliano Blow, RN Outcome: Progressing Goal: Respiratory complications will improve 05/03/2024 0517 by Aureliano Blow, RN Outcome: Progressing 05/03/2024 0515 by Aureliano Blow, RN Outcome: Progressing Goal: Cardiovascular complication will be avoided 05/03/2024 0517 by Aureliano Blow, RN Outcome: Progressing 05/03/2024 0515 by Aureliano Blow, RN Outcome: Progressing   Problem: Activity: Goal: Risk for  activity intolerance will decrease 05/03/2024 0517 by Aureliano Blow, RN Outcome: Progressing 05/03/2024 0515 by Aureliano Blow, RN Outcome: Progressing   Problem: Nutrition: Goal: Adequate nutrition will be maintained 05/03/2024 0517 by Aureliano Blow, RN Outcome: Progressing 05/03/2024 0515 by Aureliano Blow, RN Outcome: Progressing   Problem: Coping: Goal: Level of anxiety will decrease  05/03/2024 0517 by Aureliano Blow, RN Outcome: Progressing 05/03/2024 0515 by Aureliano Blow, RN Outcome: Progressing   Problem: Elimination: Goal: Will not experience complications related to bowel motility 05/03/2024 0517 by Aureliano Blow, RN Outcome: Progressing 05/03/2024 0515 by Aureliano Blow, RN Outcome: Progressing Goal: Will not experience complications related to urinary retention 05/03/2024 0517 by Aureliano Blow, RN Outcome: Progressing 05/03/2024 0515 by Aureliano Blow, RN Outcome: Progressing   Problem: Pain Managment: Goal: General experience of comfort will improve and/or be controlled 05/03/2024 0517 by Aureliano Blow, RN Outcome: Progressing 05/03/2024 0515 by Aureliano Blow, RN Outcome: Progressing   Problem: Safety: Goal: Ability to remain free from injury will improve 05/03/2024 0517 by Aureliano Blow, RN Outcome: Progressing 05/03/2024 0515 by Aureliano Blow, RN Outcome: Progressing   Problem: Skin Integrity: Goal: Risk for impaired skin integrity will decrease 05/03/2024 0517 by Aureliano Blow, RN Outcome: Progressing 05/03/2024 0515 by Aureliano Blow, RN Outcome: Progressing   Problem: Education: Goal: Knowledge of disease or condition will improve 05/03/2024 0517 by Aureliano Blow, RN Outcome: Progressing 05/03/2024 0515 by Aureliano Blow, RN Outcome: Progressing Goal: Knowledge of secondary prevention will improve (MUST DOCUMENT ALL) 05/03/2024 0517 by Aureliano Blow, RN Outcome: Progressing 05/03/2024 0515 by Aureliano Blow, RN Outcome: Progressing Goal: Knowledge of patient specific risk factors will improve (DELETE if not current risk  factor) 05/03/2024 0517 by Aureliano Blow, RN Outcome: Progressing 05/03/2024 0515 by Aureliano Blow, RN Outcome: Progressing   Problem: Ischemic Stroke/TIA Tissue Perfusion: Goal: Complications of ischemic stroke/TIA will be minimized 05/03/2024 0517 by Aureliano Blow, RN Outcome: Progressing 05/03/2024 0515 by Aureliano Blow, RN Outcome: Progressing   Problem: Coping: Goal: Will verbalize positive feelings about self 05/03/2024 0517 by Aureliano Blow, RN Outcome: Progressing 05/03/2024 0515 by Aureliano Blow, RN Outcome: Progressing Goal: Will identify appropriate support needs 05/03/2024 0517 by Aureliano Blow, RN Outcome: Progressing 05/03/2024 0515 by Aureliano Blow, RN Outcome: Progressing   Problem: Health Behavior/Discharge Planning: Goal: Ability to manage health-related needs will improve 05/03/2024 0517 by Aureliano Blow, RN Outcome: Progressing 05/03/2024 0515 by Aureliano Blow, RN Outcome: Progressing Goal: Goals will be collaboratively established with patient/family 05/03/2024 0517 by Aureliano Blow, RN Outcome: Progressing 05/03/2024 0515 by Aureliano Blow, RN Outcome: Progressing   Problem: Self-Care: Goal: Ability to participate in self-care as condition permits will improve 05/03/2024 0517 by Aureliano Blow, RN Outcome: Progressing 05/03/2024 0515 by Aureliano Blow, RN Outcome: Progressing Goal: Verbalization of feelings and concerns over difficulty with self-care will improve 05/03/2024 0517 by Aureliano Blow, RN Outcome: Progressing 05/03/2024 0515 by Aureliano Blow, RN Outcome: Progressing Goal: Ability to communicate needs accurately will improve 05/03/2024 0517 by Aureliano Blow, RN Outcome: Progressing 05/03/2024 0515 by Aureliano Blow, RN Outcome: Progressing   Problem: Nutrition: Goal: Risk of aspiration will decrease 05/03/2024 0517 by Aureliano Blow, RN Outcome: Progressing 05/03/2024 0515 by Aureliano Blow, RN Outcome: Progressing Goal: Dietary intake will improve 05/03/2024 0517 by Aureliano Blow, RN Outcome:  Progressing 05/03/2024 0515 by Aureliano Blow, RN Outcome: Progressing   Problem: Education: Goal: Knowledge of General Education information will improve Description: Including pain rating scale, medication(s)/side effects and non-pharmacologic comfort measures 05/03/2024 0517 by Aureliano Blow, RN Outcome: Progressing 05/03/2024 0515 by Aureliano Blow, RN Outcome: Progressing   Problem: Health Behavior/Discharge Planning: Goal: Ability to manage health-related needs will improve 05/03/2024 0517 by Aureliano Blow, RN Outcome: Progressing 05/03/2024 0515 by Aureliano Blow, RN Outcome: Progressing   Problem: Clinical Measurements: Goal: Ability to maintain clinical measurements within normal limits will improve 05/03/2024 0517 by Aureliano Blow,  RN Outcome: Progressing 05/03/2024 0515 by Aureliano Blow, RN Outcome: Progressing Goal: Will remain free from infection 05/03/2024 0517 by Aureliano Blow, RN Outcome: Progressing 05/03/2024 0515 by Aureliano Blow, RN Outcome: Progressing Goal: Diagnostic test results will improve 05/03/2024 0517 by Aureliano Blow, RN Outcome: Progressing 05/03/2024 0515 by Aureliano Blow, RN Outcome: Progressing Goal: Respiratory complications will improve 05/03/2024 0517 by Aureliano Blow, RN Outcome: Progressing 05/03/2024 0515 by Aureliano Blow, RN Outcome: Progressing Goal: Cardiovascular complication will be avoided 05/03/2024 0517 by Aureliano Blow, RN Outcome: Progressing 05/03/2024 0515 by Aureliano Blow, RN Outcome: Progressing   Problem: Activity: Goal: Risk for activity intolerance will decrease 05/03/2024 0517 by Aureliano Blow, RN Outcome: Progressing 05/03/2024 0515 by Aureliano Blow, RN Outcome: Progressing   Problem: Nutrition: Goal: Adequate nutrition will be maintained 05/03/2024 0517 by Aureliano Blow, RN Outcome: Progressing 05/03/2024 0515 by Aureliano Blow, RN Outcome: Progressing   Problem: Coping: Goal: Level of anxiety will decrease 05/03/2024 0517 by Aureliano Blow, RN Outcome: Progressing 05/03/2024  0515 by Aureliano Blow, RN Outcome: Progressing   Problem: Elimination: Goal: Will not experience complications related to bowel motility 05/03/2024 0517 by Aureliano Blow, RN Outcome: Progressing 05/03/2024 0515 by Aureliano Blow, RN Outcome: Progressing Goal: Will not experience complications related to urinary retention 05/03/2024 0517 by Aureliano Blow, RN Outcome: Progressing 05/03/2024 0515 by Aureliano Blow, RN Outcome: Progressing   Problem: Pain Managment: Goal: General experience of comfort will improve and/or be controlled 05/03/2024 0517 by Aureliano Blow, RN Outcome: Progressing 05/03/2024 0515 by Aureliano Blow, RN Outcome: Progressing   Problem: Safety: Goal: Ability to remain free from injury will improve 05/03/2024 0517 by Aureliano Blow, RN Outcome: Progressing 05/03/2024 0515 by Aureliano Blow, RN Outcome: Progressing   Problem: Skin Integrity: Goal: Risk for impaired skin integrity will decrease 05/03/2024 0517 by Aureliano Blow, RN Outcome: Progressing 05/03/2024 0515 by Aureliano Blow, RN Outcome: Progressing   Problem: Education: Goal: Knowledge of disease or condition will improve 05/03/2024 0517 by Aureliano Blow, RN Outcome: Progressing 05/03/2024 0515 by Aureliano Blow, RN Outcome: Progressing Goal: Knowledge of secondary prevention will improve (MUST DOCUMENT ALL) 05/03/2024 0517 by Aureliano Blow, RN Outcome: Progressing 05/03/2024 0515 by Aureliano Blow, RN Outcome: Progressing Goal: Knowledge of patient specific risk factors will improve (DELETE if not current risk factor) 05/03/2024 0517 by Aureliano Blow, RN Outcome: Progressing 05/03/2024 0515 by Aureliano Blow, RN Outcome: Progressing   Problem: Ischemic Stroke/TIA Tissue Perfusion: Goal: Complications of ischemic stroke/TIA will be minimized 05/03/2024 0517 by Aureliano Blow, RN Outcome: Progressing 05/03/2024 0515 by Aureliano Blow, RN Outcome: Progressing   Problem: Coping: Goal: Will verbalize positive feelings about self 05/03/2024 0517 by Aureliano Blow,  RN Outcome: Progressing 05/03/2024 0515 by Aureliano Blow, RN Outcome: Progressing Goal: Will identify appropriate support needs 05/03/2024 0517 by Aureliano Blow, RN Outcome: Progressing 05/03/2024 0515 by Aureliano Blow, RN Outcome: Progressing   Problem: Health Behavior/Discharge Planning: Goal: Ability to manage health-related needs will improve 05/03/2024 0517 by Aureliano Blow, RN Outcome: Progressing 05/03/2024 0515 by Aureliano Blow, RN Outcome: Progressing Goal: Goals will be collaboratively established with patient/family 05/03/2024 0517 by Aureliano Blow, RN Outcome: Progressing 05/03/2024 0515 by Aureliano Blow, RN Outcome: Progressing   Problem: Self-Care: Goal: Ability to participate in self-care as condition permits will improve 05/03/2024 0517 by Aureliano Blow, RN Outcome: Progressing 05/03/2024 0515 by Aureliano Blow, RN Outcome: Progressing Goal: Verbalization of feelings and concerns over difficulty with self-care will improve 05/03/2024 0517 by Aureliano Blow, RN Outcome: Progressing 05/03/2024 0515 by Aureliano Blow, RN Outcome: Progressing  Goal: Ability to communicate needs accurately will improve 05/03/2024 0517 by Aureliano Blow, RN Outcome: Progressing 05/03/2024 0515 by Aureliano Blow, RN Outcome: Progressing   Problem: Nutrition: Goal: Risk of aspiration will decrease 05/03/2024 0517 by Aureliano Blow, RN Outcome: Progressing 05/03/2024 0515 by Aureliano Blow, RN Outcome: Progressing Goal: Dietary intake will improve 05/03/2024 0517 by Aureliano Blow, RN Outcome: Progressing 05/03/2024 0515 by Aureliano Blow, RN Outcome: Progressing   Problem: Education: Goal: Knowledge of General Education information will improve Description: Including pain rating scale, medication(s)/side effects and non-pharmacologic comfort measures 05/03/2024 0517 by Aureliano Blow, RN Outcome: Progressing 05/03/2024 0515 by Aureliano Blow, RN Outcome: Progressing   Problem: Health Behavior/Discharge Planning: Goal: Ability to manage health-related  needs will improve 05/03/2024 0517 by Aureliano Blow, RN Outcome: Progressing 05/03/2024 0515 by Aureliano Blow, RN Outcome: Progressing   Problem: Clinical Measurements: Goal: Ability to maintain clinical measurements within normal limits will improve 05/03/2024 0517 by Aureliano Blow, RN Outcome: Progressing 05/03/2024 0515 by Aureliano Blow, RN Outcome: Progressing Goal: Will remain free from infection 05/03/2024 0517 by Aureliano Blow, RN Outcome: Progressing 05/03/2024 0515 by Aureliano Blow, RN Outcome: Progressing Goal: Diagnostic test results will improve 05/03/2024 0517 by Aureliano Blow, RN Outcome: Progressing 05/03/2024 0515 by Aureliano Blow, RN Outcome: Progressing Goal: Respiratory complications will improve 05/03/2024 0517 by Aureliano Blow, RN Outcome: Progressing 05/03/2024 0515 by Aureliano Blow, RN Outcome: Progressing Goal: Cardiovascular complication will be avoided 05/03/2024 0517 by Aureliano Blow, RN Outcome: Progressing 05/03/2024 0515 by Aureliano Blow, RN Outcome: Progressing   Problem: Activity: Goal: Risk for activity intolerance will decrease 05/03/2024 0517 by Aureliano Blow, RN Outcome: Progressing 05/03/2024 0515 by Aureliano Blow, RN Outcome: Progressing   Problem: Nutrition: Goal: Adequate nutrition will be maintained 05/03/2024 0517 by Aureliano Blow, RN Outcome: Progressing 05/03/2024 0515 by Aureliano Blow, RN Outcome: Progressing   Problem: Coping: Goal: Level of anxiety will decrease 05/03/2024 0517 by Aureliano Blow, RN Outcome: Progressing 05/03/2024 0515 by Aureliano Blow, RN Outcome: Progressing   Problem: Elimination: Goal: Will not experience complications related to bowel motility 05/03/2024 0517 by Aureliano Blow, RN Outcome: Progressing 05/03/2024 0515 by Aureliano Blow, RN Outcome: Progressing Goal: Will not experience complications related to urinary retention 05/03/2024 0517 by Aureliano Blow, RN Outcome: Progressing 05/03/2024 0515 by Aureliano Blow, RN Outcome: Progressing   Problem: Pain  Managment: Goal: General experience of comfort will improve and/or be controlled 05/03/2024 0517 by Aureliano Blow, RN Outcome: Progressing 05/03/2024 0515 by Aureliano Blow, RN Outcome: Progressing   Problem: Safety: Goal: Ability to remain free from injury will improve 05/03/2024 0517 by Aureliano Blow, RN Outcome: Progressing 05/03/2024 0515 by Aureliano Blow, RN Outcome: Progressing   Problem: Skin Integrity: Goal: Risk for impaired skin integrity will decrease 05/03/2024 0517 by Aureliano Blow, RN Outcome: Progressing 05/03/2024 0515 by Aureliano Blow, RN Outcome: Progressing   Problem: Education: Goal: Knowledge of disease or condition will improve 05/03/2024 0517 by Aureliano Blow, RN Outcome: Progressing 05/03/2024 0515 by Aureliano Blow, RN Outcome: Progressing Goal: Knowledge of secondary prevention will improve (MUST DOCUMENT ALL) 05/03/2024 0517 by Aureliano Blow, RN Outcome: Progressing 05/03/2024 0515 by Aureliano Blow, RN Outcome: Progressing Goal: Knowledge of patient specific risk factors will improve (DELETE if not current risk factor) 05/03/2024 0517 by Aureliano Blow, RN Outcome: Progressing 05/03/2024 0515 by Aureliano Blow, RN Outcome: Progressing   Problem: Ischemic Stroke/TIA Tissue Perfusion: Goal: Complications of ischemic stroke/TIA will be minimized 05/03/2024 0517 by Aureliano Blow, RN Outcome: Progressing 05/03/2024 0515 by Aureliano Blow, RN Outcome: Progressing  Problem: Coping: Goal: Will verbalize positive feelings about self 05/03/2024 0517 by Aureliano Blow, RN Outcome: Progressing 05/03/2024 0515 by Aureliano Blow, RN Outcome: Progressing Goal: Will identify appropriate support needs 05/03/2024 0517 by Aureliano Blow, RN Outcome: Progressing 05/03/2024 0515 by Aureliano Blow, RN Outcome: Progressing   Problem: Health Behavior/Discharge Planning: Goal: Ability to manage health-related needs will improve 05/03/2024 0517 by Aureliano Blow, RN Outcome: Progressing 05/03/2024 0515 by Aureliano Blow, RN Outcome:  Progressing Goal: Goals will be collaboratively established with patient/family 05/03/2024 0517 by Aureliano Blow, RN Outcome: Progressing 05/03/2024 0515 by Aureliano Blow, RN Outcome: Progressing   Problem: Self-Care: Goal: Ability to participate in self-care as condition permits will improve 05/03/2024 0517 by Aureliano Blow, RN Outcome: Progressing 05/03/2024 0515 by Aureliano Blow, RN Outcome: Progressing Goal: Verbalization of feelings and concerns over difficulty with self-care will improve 05/03/2024 0517 by Aureliano Blow, RN Outcome: Progressing 05/03/2024 0515 by Aureliano Blow, RN Outcome: Progressing Goal: Ability to communicate needs accurately will improve 05/03/2024 0517 by Aureliano Blow, RN Outcome: Progressing 05/03/2024 0515 by Aureliano Blow, RN Outcome: Progressing   Problem: Nutrition: Goal: Risk of aspiration will decrease 05/03/2024 0517 by Aureliano Blow, RN Outcome: Progressing 05/03/2024 0515 by Aureliano Blow, RN Outcome: Progressing Goal: Dietary intake will improve 05/03/2024 0517 by Aureliano Blow, RN Outcome: Progressing 05/03/2024 0515 by Aureliano Blow, RN Outcome: Progressing   Problem: Education: Goal: Knowledge of General Education information will improve Description: Including pain rating scale, medication(s)/side effects and non-pharmacologic comfort measures 05/03/2024 0517 by Aureliano Blow, RN Outcome: Progressing 05/03/2024 0515 by Aureliano Blow, RN Outcome: Progressing   Problem: Health Behavior/Discharge Planning: Goal: Ability to manage health-related needs will improve 05/03/2024 0517 by Aureliano Blow, RN Outcome: Progressing 05/03/2024 0515 by Aureliano Blow, RN Outcome: Progressing   Problem: Clinical Measurements: Goal: Ability to maintain clinical measurements within normal limits will improve 05/03/2024 0517 by Aureliano Blow, RN Outcome: Progressing 05/03/2024 0515 by Aureliano Blow, RN Outcome: Progressing Goal: Will remain free from infection 05/03/2024 0517 by Aureliano Blow, RN Outcome:  Progressing 05/03/2024 0515 by Aureliano Blow, RN Outcome: Progressing Goal: Diagnostic test results will improve 05/03/2024 0517 by Aureliano Blow, RN Outcome: Progressing 05/03/2024 0515 by Aureliano Blow, RN Outcome: Progressing Goal: Respiratory complications will improve 05/03/2024 0517 by Aureliano Blow, RN Outcome: Progressing 05/03/2024 0515 by Aureliano Blow, RN Outcome: Progressing Goal: Cardiovascular complication will be avoided 05/03/2024 0517 by Aureliano Blow, RN Outcome: Progressing 05/03/2024 0515 by Aureliano Blow, RN Outcome: Progressing   Problem: Activity: Goal: Risk for activity intolerance will decrease 05/03/2024 0517 by Aureliano Blow, RN Outcome: Progressing 05/03/2024 0515 by Aureliano Blow, RN Outcome: Progressing   Problem: Nutrition: Goal: Adequate nutrition will be maintained 05/03/2024 0517 by Aureliano Blow, RN Outcome: Progressing 05/03/2024 0515 by Aureliano Blow, RN Outcome: Progressing   Problem: Coping: Goal: Level of anxiety will decrease 05/03/2024 0517 by Aureliano Blow, RN Outcome: Progressing 05/03/2024 0515 by Aureliano Blow, RN Outcome: Progressing   Problem: Elimination: Goal: Will not experience complications related to bowel motility 05/03/2024 0517 by Aureliano Blow, RN Outcome: Progressing 05/03/2024 0515 by Aureliano Blow, RN Outcome: Progressing Goal: Will not experience complications related to urinary retention 05/03/2024 0517 by Aureliano Blow, RN Outcome: Progressing 05/03/2024 0515 by Aureliano Blow, RN Outcome: Progressing   Problem: Pain Managment: Goal: General experience of comfort will improve and/or be controlled 05/03/2024 0517 by Aureliano Blow, RN Outcome: Progressing 05/03/2024 0515 by Aureliano Blow, RN Outcome: Progressing   Problem: Safety: Goal: Ability to remain free from injury will improve 05/03/2024 0517  by Aureliano Blow, RN Outcome: Progressing 05/03/2024 0515 by Aureliano Blow, RN Outcome: Progressing   Problem: Skin Integrity: Goal: Risk for impaired skin integrity will  decrease 05/03/2024 0517 by Aureliano Blow, RN Outcome: Progressing 05/03/2024 0515 by Aureliano Blow, RN Outcome: Progressing

## 2024-05-03 NOTE — Progress Notes (Signed)
 Called [phone] provided report to the nurse, Zack, on [facility] [place] assuming care of Ms. Shipley.

## 2024-05-04 DIAGNOSIS — I6381 Other cerebral infarction due to occlusion or stenosis of small artery: Secondary | ICD-10-CM | POA: Diagnosis not present

## 2024-05-04 DIAGNOSIS — K5901 Slow transit constipation: Secondary | ICD-10-CM | POA: Diagnosis not present

## 2024-05-04 DIAGNOSIS — R739 Hyperglycemia, unspecified: Secondary | ICD-10-CM | POA: Diagnosis not present

## 2024-05-04 DIAGNOSIS — I10 Essential (primary) hypertension: Secondary | ICD-10-CM

## 2024-05-04 LAB — GLUCOSE, CAPILLARY: Glucose-Capillary: 105 mg/dL — ABNORMAL HIGH (ref 70–99)

## 2024-05-04 NOTE — Evaluation (Signed)
 Physical Therapy Assessment and Plan  Patient Details  Name: Anita Reilly MRN: [id number] Date of Birth: [birth year]  PT Diagnosis: Abnormal posture, Abnormality of gait, Ataxia, Ataxic gait, Coordination disorder, Difficulty walking, Dizziness and giddiness, Edema, Hemiparesis non-dominant, Muscle spasms, and Muscle weakness Rehab Potential: Good ELOS: 7-10 days   Today's Date: 05/04/2024 PT Individual Time: 1191-4782 PT Individual Time Calculation (min): 71 min    Hospital Problem: Principal Problem:   Lacunar infarction [facility])   Past Medical History:  Past Medical History:  Diagnosis Date   Arthritis    shoulders   Bell's palsy 2016   Vertigo    none in several years   Past Surgical History:  Past Surgical History:  Procedure Laterality Date   BREAST BIOPSY Left 05/06/2021   u/s bx, hydromark, path pending   CARPAL TUNNEL RELEASE     left    CATARACT EXTRACTION W/PHACO Right 09/19/2018   Procedure: CATARACT EXTRACTION PHACO AND INTRAOCULAR LENS PLACEMENT (IOC) RIGHT;  Surgeon: Annell Kidney, MD;  Location: [facility];  Service: Ophthalmology;  Laterality: Right;  latex sensitivity   CATARACT EXTRACTION W/PHACO Left 10/10/2018   Procedure: CATARACT EXTRACTION PHACO AND INTRAOCULAR LENS PLACEMENT (IOC)  LEFT;  Surgeon: Annell Kidney, MD;  Location: [facility];  Service: Ophthalmology;  Laterality: Left;  Latex sensitivity requests arrival around 10 or 10:30   COLONOSCOPY      Assessment & Plan Clinical Impression: Patient is a 78 y.o. year old female with PMH of Bell's Palsy, HTN, HLD, GERD, asthma who presented to [facility] on 04/27/24 with stroke like symptoms while at a wedding. Pt reports possible syncopal episode, but not sure. In ED, vitals WNL, pt with ongoing L hemiparesis, labs unremarkable. 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 She did receive TNK. Stroke workup was completed and neurology recommended aspirin  and plavix  x 3 weeks, then plavix  alone. Therapy evaluations were completed and pt was recommended for CIR.   Patient currently requires min with mobility secondary to muscle weakness, decreased cardiorespiratoy endurance, impaired timing and sequencing, abnormal tone, unbalanced muscle activation, ataxia, and decreased coordination, and decreased standing balance, decreased postural control, hemiplegia, and decreased balance strategies.  Prior to hospitalization, patient was independent  with mobility and lived with Alone in a House home.  Home access is 1 STE after rampStairs to enter, Ramped entrance.  Patient will benefit from skilled PT intervention to maximize safe functional mobility, minimize fall risk, and decrease caregiver burden for planned discharge home with intermittent assist.  Anticipate patient will benefit from follow up OP at discharge.  PT - End of Session Activity Tolerance: Tolerates 30+ min activity with multiple rests Endurance Deficit: Yes Endurance Deficit Description: fatigued after ambulating PT Assessment Rehab Potential (ACUTE/IP ONLY): Good PT Barriers to Discharge: Home environment access/layout;Other (comments) PT Barriers to Discharge Comments: L hemi with ataxia, steps inside home, lives alone PT Patient demonstrates impairments in the following area(s): Balance;Edema;Endurance;Motor;Skin Integrity;Nutrition PT Transfers Functional Problem(s): Bed Mobility;Bed to Chair;Car;Furniture PT Locomotion Functional Problem(s): Ambulation;Wheelchair Mobility;Stairs PT Plan PT Intensity: Minimum of 1-2 x/day ,45 to 90 minutes PT Frequency: 5 out of 7 days PT Duration Estimated Length of Stay: 7-10 days PT Treatment/Interventions: Ambulation/gait training;Discharge planning;Functional mobility training;Psychosocial support;Therapeutic  Activities;Visual/perceptual remediation/compensation;Balance/vestibular training;Disease management/prevention;Neuromuscular re-education;Skin care/wound management;Therapeutic Exercise;Wheelchair propulsion/positioning;DME/adaptive equipment instruction;Cognitive remediation/compensation;Pain management;Splinting/orthotics;UE/LE Strength taining/ROM;Community reintegration;Functional electrical stimulation;Patient/family education;Stair training;UE/LE Coordination activities PT  Transfers Anticipated Outcome(s): Mod I with LRAD PT Locomotion Anticipated Outcome(s): Mod I with LRAD PT Recommendation Follow Up Recommendations: Outpatient PT Patient destination: Home Equipment Recommended: To be determined Equipment Details: has RW   PT Evaluation Precautions/Restrictions Precautions Precautions: Fall Precaution/Restrictions Comments: L hemi Restrictions Weight Bearing Restrictions Per Provider Order: No Pain Interference Pain Interference Pain Effect on Sleep: 1. Rarely or not at all Pain Interference with Therapy Activities: 1. Rarely or not at all Pain Interference with Day-to-Day Activities: 1. Rarely or not at all Home Living/Prior Functioning Home Living Available Help at Discharge: Family;Available PRN/intermittently (son and DIL live 7 mins away. However potential plan for children to stay at night and cousin to stay during day if needed) Type of Home: House Home Access: Stairs to enter;Ramped entrance Entrance Stairs-Number of Steps: 1 STE after ramp Entrance Stairs-Rails: Right Home Layout: Two level;Full bath on main level;Able to live on main level with bedroom/bathroom Alternate Level Stairs-Number of Steps: 6, landing and 6 Alternate Level Stairs-Rails: Left Bathroom Shower/Tub: Tub/shower unit;Walk-in shower;Curtain Bathroom Toilet: Standard Bathroom Accessibility: Yes  Lives With: Alone Prior Function Level of Independence: Independent with basic ADLs;Independent  with transfers;Independent with homemaking with ambulation;Independent with gait  Able to Take Stairs?: Yes Driving: Yes Vision/Perception  Vision - History Ability to See in Adequate Light: 0 Adequate Perception Perception: Within Functional Limits Praxis Praxis: WFL  Cognition Overall Cognitive Status: Within Functional Limits for tasks assessed Arousal/Alertness: Awake/alert Orientation Level: Oriented X4 Memory: Appears intact Awareness: Appears intact Problem Solving: Appears intact Safety/Judgment: Appears intact Sensation Sensation Light Touch: Appears Intact Hot/Cold: Not tested Proprioception: Appears Intact Stereognosis: Not tested Coordination Gross Motor Movements are Fluid and Coordinated: No Fine Motor Movements are Fluid and Coordinated: Yes Coordination and Movement Description: grossly uncoordinated due to L hemi, ataxia, decreased balance/coordination, and weakness Finger Nose Finger Test: WFL bilaterally, slightly slower on LUE Heel Shin Test: uncoordinated bilaterally with ataxia noted Motor  Motor Motor: Hemiplegia;Ataxia Motor - Skilled Clinical Observations: L hemi, ataxia, decreased balance/coordination, and weakness  Trunk/Postural Assessment  Cervical Assessment Cervical Assessment: Within Functional Limits Thoracic Assessment Thoracic Assessment: Within Functional Limits Lumbar Assessment Lumbar Assessment: Exceptions to [facility] (posterior pelvic tilt) Postural Control Postural Control: Deficits on evaluation Protective Responses: slightly delayed on L  Balance Balance Balance Assessed: Yes Static Sitting Balance Static Sitting - Balance Support: Feet supported;Bilateral upper extremity supported Static Sitting - Level of Assistance: 6: Modified independent (Device/Increase time) Dynamic Sitting Balance Dynamic Sitting - Balance Support: Feet supported;No upper extremity supported Dynamic Sitting - Level of Assistance: 5: Stand by assistance  (distant supervision) Static Standing Balance Static Standing - Balance Support: No upper extremity supported;During functional activity Static Standing - Level of Assistance: 5: Stand by assistance (CGA) Dynamic Standing Balance Dynamic Standing - Balance Support: No upper extremity supported;During functional activity Dynamic Standing - Level of Assistance: 4: Min assist Dynamic Standing - Comments: with transfers Extremity Assessment  RLE Assessment RLE Assessment: Exceptions to [facility] Strength Comments: tested sitting in WC RLE Strength Right Hip Flexion: 4-/5 Right Hip ABduction: 4/5 Right Hip ADduction: 4/5 Right Knee Flexion: 4/5 Right Knee Extension: 4/5 Right Ankle Dorsiflexion: 4+/5 Right Ankle Plantar Flexion: 4+/5 LLE Assessment LLE Assessment: Exceptions to [facility] General Strength Comments: tested sitting in WC - ataxia noted LLE Strength Left Hip Flexion: 3-/5 Left Hip ABduction: 3+/5 Left Hip ADduction: 3+/5 Left Knee Flexion: 3+/5 Left Knee Extension: 3+/5 Left Ankle Dorsiflexion: 4-/5 Left Ankle Plantar Flexion: 4/5  Care Tool Care Tool  Bed Mobility Roll left and right activity   Roll left and right assist level: Independent with assistive device    Sit to lying activity   Sit to lying assist level: Minimal Assistance - Patient > 75%    Lying to sitting on side of bed activity   Lying to sitting on side of bed assist level: the ability to move from lying on the back to sitting on the side of the bed with no back support.: Contact Guard/Touching assist     Care Tool Transfers Sit to stand transfer   Sit to stand assist level: Minimal Assistance - Patient > 75%    Chair/bed transfer   Chair/bed transfer assist level: Minimal Assistance - Patient > 75%    Car transfer   Car transfer assist level: Minimal Assistance - Patient > 75%      Care Tool Locomotion Ambulation   Assist level: Moderate Assistance - Patient 50 - 74% Assistive device: No  Device Max distance: 51ft  Walk 10 feet activity   Assist level: Moderate Assistance - Patient - 50 - 74% Assistive device: No Device   Walk 50 feet with 2 turns activity Walk 50 feet with 2 turns activity did not occur: Safety/medical concerns (fatigue, weakness)      Walk 150 feet activity Walk 150 feet activity did not occur: Safety/medical concerns (fatigue, weakness)      Walk 10 feet on uneven surfaces activity   Assist level: Minimal Assistance - Patient > 75% Assistive device: Other (comment) (RUE support)  Stairs   Assist level: Minimal Assistance - Patient > 75% Stairs assistive device: 2 hand rails Max number of stairs: 4 (6in)  Walk up/down 1 step activity   Walk up/down 1 step (curb) assist level: Minimal Assistance - Patient > 75% Walk up/down 1 step or curb assistive device: 2 hand rails  Walk up/down 4 steps activity   Walk up/down 4 steps assist level: Minimal Assistance - Patient > 75% Walk up/down 4 steps assistive device: 2 hand rails  Walk up/down 12 steps activity Walk up/down 12 steps activity did not occur: Safety/medical concerns (fatigue, weakness)      Pick up small objects from floor Pick up small object from the floor (from standing position) activity did not occur: Safety/medical concerns (fatigue, weakness)      Wheelchair Is the patient using a wheelchair?: Yes Type of Wheelchair: Manual   Wheelchair assist level: Dependent - Patient 0% Max wheelchair distance: 153ft  Wheel 50 feet with 2 turns activity   Assist Level: Dependent - Patient 0%  Wheel 150 feet activity   Assist Level: Dependent - Patient 0%    Refer to Care Plan for Long Term Goals  SHORT TERM GOAL WEEK 1 PT Short Term Goal 1 (Week 1): STG=LTG due to LOS  Recommendations for other services: None   Skilled Therapeutic Intervention Evaluation completed (see details above and below) with education on PT POC and goals and individual treatment initiated with focus on  functional mobility/transfers, generalized strengthening and endurance, dynamic standing balance/coordination, simulated car transfers, stair navigation, and ambulation. Received pt sitting in WC, pt educated on PT evaluation, CIR policies, and therapy schedule and agreeable. Pt denied any pain during session but fogginess in her head. Pt reported urge to toilet - performed all transfers with RW and CGA/close supervision throughout session. Pt ambulated in/out of bathroom with RW and CGA/close supervision and able to perform all toileting tasks without assist and able to perform hand hygiene  with supervision.   Pt transported to/from room in [facility] dependently for time management purposes. Pt performed remainder of transfers without AD and CGA/min A throughout session. Pt performed simulated car transfer without AD and min A and ambulated 53ft on uneven surfaces (ramp) without AD and RUE support and min A - cues to increase L stride length due to poor foot clearance. Pt ambulated to staircase without AD and min A and navigated 4 6in steps with bilateral handrails and min A using a step to pattern with cues for up with the good, down with the bad technique - pt with increased difficulty clearing L toe and compensating due to hip flexor weakness.   Stood without AD and CGA and ambulated 43ft with L HHA and min A fading to light mod A with increasing fatigue. Pt with significant difficulty clearing L foot, performing excessive R lateral weight shift to clear L foot - limited by fatigue. Pt then ambulated additional ~60ft with RW and supervision fading to CGA due to increased L toe drag. Concluded session with pt sitting in WC, needs within reach, and chair pad alarm on.   Mobility Transfers Transfers: Sit to Stand;Stand to Sit;Stand Pivot Transfers Sit to Stand: Minimal Assistance - Patient > 75% Stand to Sit: Contact Guard/Touching assist Stand Pivot Transfers: Minimal Assistance - Patient > 75% Stand  Pivot Transfer Details: Verbal cues for technique;Verbal cues for gait pattern Stand Pivot Transfer Details (indicate cue type and reason): verbal cues to increase L foot clearance Transfer (Assistive device): None Locomotion  Gait Ambulation: Yes Gait Assistance: Moderate Assistance - Patient 50-74% Gait Distance (Feet): 40 Feet Assistive device: None Gait Assistance Details: Verbal cues for sequencing;Verbal cues for technique;Verbal cues for gait pattern Gait Assistance Details: verbal cues for weight shifiting to clear L foot and to increase stride length Gait Gait: Yes Gait Pattern: Impaired Gait Pattern: Step-to pattern;Step-through pattern;Decreased step length - left;Decreased stance time - left;Decreased stride length;Decreased hip/knee flexion - left;Ataxic;Poor foot clearance - left;Poor foot clearance - right;Narrow base of support Gait velocity: decreased Stairs / Additional Locomotion Stairs: Yes Stairs Assistance: Minimal Assistance - Patient > 75% Stair Management Technique: Two rails;Step to pattern;Forwards Number of Stairs: 4 Height of Stairs: 6 Ramp: Minimal Assistance - Patient >75% (RUE support) Wheelchair Mobility Wheelchair Mobility: Yes Wheelchair Assistance: Dependent - Patient 0% Wheelchair Parts Management: Needs assistance Distance: 160ft   Discharge Criteria: Patient will be discharged from PT if patient refuses treatment 3 consecutive times without medical reason, if treatment goals not met, if there is a change in medical status, if patient makes no progress towards goals or if patient is discharged from hospital.  The above assessment, treatment plan, treatment alternatives and goals were discussed and mutually agreed upon: by patient  Aidee Latimore M Zaunegger Dewain Platz Zaunegger PT, DPT 05/04/2024, 12:14 PM

## 2024-05-04 NOTE — Plan of Care (Signed)
  Problem: Consults Goal: RH STROKE PATIENT EDUCATION Description: See Patient Education module for education specifics  Outcome: Progressing   Problem: RH BOWEL ELIMINATION Goal: RH STG MANAGE BOWEL WITH ASSISTANCE Description: STG Manage Bowel with toileting Assistance. Outcome: Progressing Goal: RH STG MANAGE BOWEL W/MEDICATION W/ASSISTANCE Description: STG Manage Bowel with Medication with mod I  Assistance. Outcome: Progressing   Problem: RH SAFETY Goal: RH STG ADHERE TO SAFETY PRECAUTIONS W/ASSISTANCE/DEVICE Description: STG Adhere to Safety Precautions With cues Assistance/Device. Outcome: Progressing   Problem: RH PAIN MANAGEMENT Goal: RH STG PAIN MANAGED AT OR BELOW PT'S PAIN GOAL Description: < 4 with prns Outcome: Progressing   Problem: RH KNOWLEDGE DEFICIT Goal: RH STG INCREASE KNOWLEDGE OF DIABETES Description: Patient and son will be able to manage predibetes using educational resources for medications and dietary modification independently Outcome: Progressing Goal: RH STG INCREASE KNOWLEDGE OF HYPERTENSION Description: Patient and son will be able to manage HTN using educational resources for medications and dietary modification independently Outcome: Progressing Goal: RH STG INCREASE KNOWLEGDE OF HYPERLIPIDEMIA Description: Patient and son will be able to manage HLD using educational resources for medications and dietary modification independently Outcome: Progressing Goal: RH STG INCREASE KNOWLEDGE OF STROKE PROPHYLAXIS Description: Patient and son will be able to manage secondary risks using educational resources for medications and dietary modification independently Outcome: Progressing

## 2024-05-04 NOTE — H&P (Addendum)
 Physical Medicine and Rehabilitation Admission H&P        Chief Complaint  Patient presents with   Code Stroke  : HPI: Anita Reilly is a 78 year old right-handed female with history of right face Bell's palsy, hypertension, hyperlipidemia, restless leg syndrome, GERD, asthma.  Per chart review patient lives alone.  One-step to entry of home.  Independent driving prior to admission.  Presented to [facility] 04/27/2024 with acute left-sided weakness/question syncopal event and altered mental status.  Cranial CT scan negative for acute changes.  CTA showed no emergent large vessel occlusion or high-grade stenosis.  Patient did receive TNK.  MRI 04/28/2024 showed no acute intracranial abnormality.  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  Admission chemistries unremarkable except BUN 28 creatinine 1.61, hemoglobin A1c 6.2.  Echocardiogram with ejection fraction of 65 to 70% no wall motion abnormalities grade 1 diastolic dysfunction.  Neurology follow-up placed on aspirin  81 mg daily and Plavix  75 mg daily for CVA prophylaxis x 3 weeks then Plavix  alone.  Tolerating a regular consistency diet.  Therapy evaluations completed due to patient decreased functional ability left-sided weakness was admitted for a comprehensive rehab program.   Review of Systems  Constitutional:  Negative for chills and fever.  HENT:  Negative for hearing loss.   Eyes:  Negative for blurred vision and double vision.  Respiratory:  Negative for cough, shortness of breath and wheezing.   Cardiovascular:  Negative for chest pain and palpitations.  Gastrointestinal:  Positive for constipation. Negative for heartburn, nausea and vomiting.  Genitourinary:  Negative for dysuria, flank pain and hematuria.  Musculoskeletal:  Positive for joint pain and myalgias.  Skin:  Negative for rash.  Neurological:  Positive for speech  change and weakness.       Patient with history of right facial weakness from Bell's palsy  All other systems reviewed and are negative.       Past Medical History:  Diagnosis Date   Arthritis      shoulders   Bell's palsy 2016   Vertigo      none in several years             Past Surgical History:  Procedure Laterality Date   BREAST BIOPSY Left 05/06/2021    u/s bx, hydromark, path pending   CARPAL TUNNEL RELEASE        left    CATARACT EXTRACTION W/PHACO Right 09/19/2018    Procedure: CATARACT EXTRACTION PHACO AND INTRAOCULAR LENS PLACEMENT (IOC) RIGHT;  Surgeon: Annell Kidney, MD;  Location: [facility];  Service: Ophthalmology;  Laterality: Right;  latex sensitivity   CATARACT EXTRACTION W/PHACO Left 10/10/2018    Procedure: CATARACT EXTRACTION PHACO AND INTRAOCULAR LENS PLACEMENT (IOC)  LEFT;  Surgeon: Annell Kidney, MD;  Location: [facility];  Service: Ophthalmology;  Laterality: Left;  Latex sensitivity requests arrival around 10 or 10:30   COLONOSCOPY                 Family History  Problem Relation Age of Onset   Breast cancer Neg Hx          Social History:  reports that she has never smoked. She has never used smokeless tobacco. She reports that she does not drink alcohol and does not use drugs. Allergies:  Allergies       Allergies  Allergen Reactions   Latex  Dental gloves caused lip swelling   Sulfa Antibiotics Swelling      lips   Tylox [Oxycodone-Acetaminophen ]     Penicillins Rash      Veetids caused rash            Medications Prior to Admission  Medication Sig Dispense Refill   Calcium  Carbonate-Vitamin D (CALTRATE 600+D PO) Take 1 tablet by mouth daily.       desipramine (NOPRAMIN) 10 MG tablet Take 10 mg by mouth daily.       losartan  (COZAAR ) 50 MG tablet Take 50 mg by mouth daily.       pregabalin  (LYRICA ) 25 MG capsule Take 1 capsule by mouth 2 (two) times daily.                  Home: Home  Living Family/patient expects to be discharged to:: Private residence Living Arrangements: Alone Available Help at Discharge: Family, Available PRN/intermittently Type of Home: House Home Access: Stairs to enter, Ramped entrance Secretary/administrator of Steps: 1 STE after ramp Entrance Stairs-Rails: None Home Layout: Two level, Able to live on main level with bedroom/bathroom Alternate Level Stairs-Number of Steps: 6, landing and 6+ Alternate Level Stairs-Rails: Right Bathroom Shower/Tub: Tub/shower unit, Health visitor: Standard Home Equipment: None   Functional History: Prior Function Prior Level of Function : Driving, Independent/Modified Independent Mobility Comments: independent ADLs Comments: independent including driving   Functional Status:  Mobility: Bed Mobility Overal bed mobility: Modified Independent Bed Mobility: Supine to Sit Supine to sit: Supervision General bed mobility comments: Not tested-pt received in chair Transfers Overall transfer level: Needs assistance Equipment used: None Transfers: Sit to/from Stand Sit to Stand: Contact guard assist General transfer comment: VC's for pushing off bed before grabbing RW Ambulation/Gait Ambulation/Gait assistance: Min assist Gait Distance (Feet): 200 Feet Assistive device: Rolling walker (2 wheels) Gait Pattern/deviations: Step-to pattern, Ataxic, Decreased stance time - left, Decreased step length - right General Gait Details: Heavy multi-modal cuing for proper gait sequencing with RW. Throughout gait activity, VC's were repeated for proper gait sequencing. Pt able to make effortful steps with LLE clearing ground with multiple VC's. Pt completed 10 foot walk test in 1 minute and 18.53 seconds. Pt ambulated 200 feet in 26 minutes. Gait velocity: 0.127 ft/sec Gait velocity interpretation: <1.31 ft/sec, indicative of household ambulator   ADL: ADL Overall ADL's : Needs  assistance/impaired Eating/Feeding: Sitting, Set up Grooming: Sitting, Set up Upper Body Bathing: Sitting, Minimal assistance Lower Body Bathing: Sit to/from stand, Sitting/lateral leans, Maximal assistance Upper Body Dressing : Sitting, Contact guard assist Lower Body Dressing: Sit to/from stand, Sitting/lateral leans, Maximal assistance Lower Body Dressing Details (indicate cue type and reason): maxA to doff socks and on shoes Toilet Transfer: +2 for physical assistance, +2 for safety/equipment, Minimal assistance, BSC/3in1, Rolling walker (2 wheels) Toilet Transfer Details (indicate cue type and reason): clinical judgement bed > recliner. anticipate pt will require BSC for safe transfers until LLE axaxia improves Toileting- Clothing Manipulation and Hygiene: Maximal assistance, Sit to/from stand Functional mobility during ADLs: Cueing for safety, Cueing for sequencing, +2 for physical assistance, Minimal assistance General ADL Comments: MIN A + RW for ADL t/f ~30 ft, decreasing to MOD A  as pt fatgiues for ~30 ft - assist to facilitate L hip flexion without excessive R trunk lean. MAX A don/doff L sock in standing. SBA standing grooming tasks - 2 lateral LOBs, self corrects with UE support.   Cognition: Cognition Orientation Level: Oriented X4 Cognition  Arousal: Alert Behavior During Therapy: WFL for tasks assessed/performed   Physical Exam: Blood pressure 113/77, pulse 68, temperature 97.7 F (36.5 C), temperature source Oral, resp. rate 18, height 5' 4 (1.626 m), weight 79.6 kg, SpO2 97%. Physical Exam Constitutional:      Appearance: She is obese.  HENT:     Head: Normocephalic and atraumatic.     Right Ear: External ear normal.     Left Ear: External ear normal.     Nose: Nose normal.     Mouth/Throat:     Mouth: Mucous membranes are moist.    Eyes:     Extraocular Movements: Extraocular movements intact.     Pupils: Pupils are equal, round, and reactive to light.       Cardiovascular:     Rate and Rhythm: Normal rate and regular rhythm.     Pulses: Normal pulses.  Pulmonary:     Effort: Pulmonary effort is normal. No respiratory distress.     Breath sounds: No wheezing.  Abdominal:     General: There is no distension.     Palpations: Abdomen is soft.    Musculoskeletal:        General: No swelling. Normal range of motion.     Cervical back: Normal range of motion.    Skin:    General: Skin is warm and dry.     Comments: NSL LUE.     Neurological:     Mental Status: She is alert.     Comments: Alert and oriented x 3. Normal insight and awareness. Intact Memory. Normal language and speech. Cranial nerve exam unremarkable except for mild left central VII. Mild right sided peripheral VII signs.  MMT: RUE 5/5. LUE 4/5 deltoid, 4+ biceps, triceps, wrist and hand. RLE 4+ to 5/5 LLE 2 to 2+ HF, KE and 3+/5 ADF/PF. Sensory exam normal for light touch and pain in all 4 limbs. No limb ataxia or cerebellar signs. No abnormal tone appreciated. DTR's 3+ LUE and LLE.     Psychiatric:        Mood and Affect: Mood normal.        Behavior: Behavior normal.        Lab Results Last 48 Hours        Results for orders placed or performed during the hospital encounter of 04/27/24 (from the past 48 hours)  CBC     Status: None    Collection Time: 05/02/24  5:34 AM  Result Value Ref Range    WBC 6.3 4.0 - 10.5 K/uL    RBC 4.24 3.87 - 5.11 MIL/uL    Hemoglobin 13.2 12.0 - 15.0 g/dL    HCT 16.1 09.6 - 04.5 %    MCV 90.1 80.0 - 100.0 fL    MCH 31.1 26.0 - 34.0 pg    MCHC 34.6 30.0 - 36.0 g/dL    RDW 40.9 81.1 - 91.4 %    Platelets 252 150 - 400 K/uL    nRBC 0.0 0.0 - 0.2 %      Comment: Performed at [facility], [address] [place], [place] [address]  Comprehensive metabolic panel with GFR     Status: Abnormal    Collection Time: 05/02/24  5:34 AM  Result Value Ref Range    Sodium 134 (L) 135 - 145 mmol/L    Potassium 4.5 3.5 - 5.1 mmol/L     Chloride 103 98 - 111 mmol/L    CO2 22 22 - 32  mmol/L    Glucose, Bld 108 (H) 70 - 99 mg/dL      Comment: Glucose reference range applies only to samples taken after fasting for at least 8 hours.    BUN 20 8 - 23 mg/dL    Creatinine, Ser 4.78 (H) 0.44 - 1.00 mg/dL    Calcium  8.9 8.9 - 10.3 mg/dL    Total Protein 6.7 6.5 - 8.1 g/dL    Albumin 3.5 3.5 - 5.0 g/dL    AST 20 15 - 41 U/L    ALT 13 0 - 44 U/L    Alkaline Phosphatase 81 38 - 126 U/L    Total Bilirubin 1.2 0.0 - 1.2 mg/dL    GFR, Estimated 57 (L) >60 mL/min      Comment: (NOTE) Calculated using the CKD-EPI Creatinine Equation (2021)      Anion gap 9 5 - 15      Comment: Performed at [facility], [address] [place], [place] [address]      Imaging Results (Last 48 hours)  No results found.         Blood pressure 113/77, pulse 68, temperature 97.7 F (36.5 C), temperature source Oral, resp. rate 18, height 5' 4 (1.626 m), weight 79.6 kg, SpO2 97%.   Medical Problem List and Plan: 1. 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  Status post TNK             -patient may shower             -ELOS/Goals: 7-10 days, mod I to supervision goals 2.  Antithrombotics: -DVT/anticoagulation:  Mechanical: Antiembolism stockings, thigh (TED hose) Bilateral lower extremities             -antiplatelet therapy: Aspirin  81 mg daily and Plavix  75 mg daily x 3 weeks then Plavix  alone 3. Pain Management: Lyrica  25 mg twice daily  -pain controlled 4. Mood/Behavior/Sleep: Trazodone  50 mg nightly as needed.  Provide emotional support             -antipsychotic agents: N/A 5. Neuropsych/cognition: This patient is capable of making decisions on her own behalf. 6. Skin/Wound Care: Routine skin checks 7. Fluids/Electrolytes/Nutrition: Routine in and outs with follow-up chemistries 8.  Hypertension.  Cozaar  50 mg daily.  Monitor with increased mobility 9.  Hyperlipidemia.  Lipitor  10.   History of right face Bell's palsy.  Maintained on Lyrica  25 mg twice daily follow-up as outpatient 11.  GERD.  Protonix  12.  ?Borderline DM, Hemoglobin A1c 6.2.   Consider adding metformin.  -check cbg's only daily, staggered, QAM and QSupper every other day  -no SSI 13.  Constipation.  MiraLAX  daily, Senokot S1 tablet at bedtime as needed   Sterling Eisenmenger, PA-C   I have personally performed a face to face diagnostic evaluation of this patient and formulated the key components of the plan.  Additionally, I have personally reviewed laboratory data, imaging studies, as well as relevant notes and concur with the physician assistant's documentation above.  The patient's status has not changed from the original H&P.  Any changes in documentation from the acute care chart have been noted above.  Rawland Caddy, MD, [facility]

## 2024-05-04 NOTE — Progress Notes (Signed)
 Inpatient Rehabilitation Admission Medication Review by a Pharmacist   A complete drug regimen review was completed for this patient to identify any potential clinically significant medication issues.   High Risk Drug Classes Is patient taking? Indication by Medication  Antipsychotic No    Anticoagulant No    Antibiotic No    Opioid No    Antiplatelet Yes Aspirin , Plavix  x 3 weeks (start date 6/10) then Plavix  alone   Hypoglycemics/insulin No    Vasoactive Medication Yes Losartan  - BP  Chemotherapy No    Other Yes Atorvastatin  - HLD Pantoprazole  - Reflux Pregablin - neuropathy Trazodone  prn sleep APAP- pain  Senokot-S- constipation         Type of Medication Issue Identified Description of Issue Recommendation(s)  Drug Interaction(s) (clinically significant)        Duplicate Therapy        Allergy        No Medication Administration End Date        Incorrect Dose        Additional Drug Therapy Needed        Significant med changes from prior encounter (inform family/care partners about these prior to discharge).  PTA meds not resumed: metformin, Caltrate 600 + D, desipramine (not resumed in acute care setting, discontinued)   Resume PTA meds as clinically appropriate   Other            Clinically significant medication issues were identified that warrant physician communication and completion of prescribed/recommended actions by midnight of the next day:  No   Name of provider notified for urgent issues identified:    Provider Method of Notification:        Pharmacist comments: None   Time spent performing this drug regimen review (minutes):  20 minutes   Chrystie Crass, PharmD Clinical Pharmacist  05/04/2024 7:39 AM

## 2024-05-04 NOTE — Plan of Care (Signed)
  Problem: RH Balance Goal: LTG Patient will maintain dynamic standing with ADLs (OT) Description: LTG:  Patient will maintain dynamic standing balance with assist during activities of daily living (OT)  Flowsheets (Taken 05/04/2024 1232) LTG: Pt will maintain dynamic standing balance during ADLs with: Independent with assistive device   Problem: Sit to Stand Goal: LTG:  Patient will perform sit to stand in prep for activites of daily living with assistance level (OT) Description: LTG:  Patient will perform sit to stand in prep for activites of daily living with assistance level (OT) Flowsheets (Taken 05/04/2024 1232) LTG: PT will perform sit to stand in prep for activites of daily living with assistance level: Independent with assistive device   Problem: RH Bathing Goal: LTG Patient will bathe all body parts with assist levels (OT) Description: LTG: Patient will bathe all body parts with assist levels (OT) Flowsheets (Taken 05/04/2024 1232) LTG: Pt will perform bathing with assistance level/cueing: Independent with assistive device    Problem: RH Dressing Goal: LTG Patient will perform lower body dressing w/assist (OT) Description: LTG: Patient will perform lower body dressing with assist, with/without cues in positioning using equipment (OT) Flowsheets (Taken 05/04/2024 1232) LTG: Pt will perform lower body dressing with assistance level of: Independent with assistive device   Problem: RH Toileting Goal: LTG Patient will perform toileting task (3/3 steps) with assistance level (OT) Description: LTG: Patient will perform toileting task (3/3 steps) with assistance level (OT)  Flowsheets (Taken 05/04/2024 1232) LTG: Pt will perform toileting task (3/3 steps) with assistance level: Independent with assistive device   Problem: RH Simple Meal Prep Goal: LTG Patient will perform simple meal prep w/assist (OT) Description: LTG: Patient will perform simple meal prep with assistance, with/without  cues (OT). Flowsheets (Taken 05/04/2024 1232) LTG: Pt will perform simple meal prep with assistance level of: Independent with assistive device LTG: Pt will perform simple meal prep w/level of: Ambulate with device   Problem: RH Toilet Transfers Goal: LTG Patient will perform toilet transfers w/assist (OT) Description: LTG: Patient will perform toilet transfers with assist, with/without cues using equipment (OT) Flowsheets (Taken 05/04/2024 1232) LTG: Pt will perform toilet transfers with assistance level of: Independent with assistive device   Problem: RH Tub/Shower Transfers Goal: LTG Patient will perform tub/shower transfers w/assist (OT) Description: LTG: Patient will perform tub/shower transfers with assist, with/without cues using equipment (OT) Flowsheets (Taken 05/04/2024 1232) LTG: Pt will perform tub/shower stall transfers with assistance level of: Independent with assistive device

## 2024-05-04 NOTE — TOC Progression Note (Signed)
 Transition of Care [facility]) - Progression Note    Patient Details  Name: Anita Reilly MRN: [id number] Date of Birth: 1946/08/20  [facility] CM/SW Contact  Zoe Hinds, RN Phone Number: 05/04/2024, 4:07 PM  Clinical Narrative:    This CM informed by medical team pt discharging to CIR . No TOC needs identified.    Expected Discharge Plan: IP Rehab Facility Barriers to Discharge: Continued Medical Work up  Expected Discharge Plan and Services       Living arrangements for the past 2 months: Single Family Home Expected Discharge Date: 05/03/24                                     Social Determinants of Health (SDOH) Interventions SDOH Screenings   Food Insecurity: No Food Insecurity (04/28/2024)  Housing: Low Risk  (04/28/2024)  Transportation Needs: No Transportation Needs (04/28/2024)  Utilities: Not At Risk (04/28/2024)  Financial Resource Strain: Low Risk  (10/31/2023)   Received from [facility]  Social Connections: Unknown (04/28/2024)  Tobacco Use: Low Risk  (05/03/2024)    Readmission Risk Interventions     No data to display

## 2024-05-04 NOTE — Evaluation (Signed)
 Occupational Therapy Assessment and Plan  Patient Details  Name: Anita Reilly MRN: [id number] Date of Birth: 1946-02-27  OT Diagnosis: acute pain, hemiplegia affecting non-dominant side, and muscle weakness (generalized) Rehab Potential: Rehab Potential (ACUTE ONLY): Good ELOS: 7-10 days   Today's Date: 05/04/2024 OT Individual Time: 4098-1191 OT Individual Time Calculation (min): 70 min     Hospital Problem: Principal Problem:   Lacunar infarction [facility])   Past Medical History:  Past Medical History:  Diagnosis Date   Arthritis    shoulders   Bell's palsy 2016   Vertigo    none in several years   Past Surgical History:  Past Surgical History:  Procedure Laterality Date   BREAST BIOPSY Left 05/06/2021   u/s bx, hydromark, path pending   CARPAL TUNNEL RELEASE     left    CATARACT EXTRACTION W/PHACO Right 09/19/2018   Procedure: CATARACT EXTRACTION PHACO AND INTRAOCULAR LENS PLACEMENT (IOC) RIGHT;  Surgeon: Annell Kidney, MD;  Location: [facility];  Service: Ophthalmology;  Laterality: Right;  latex sensitivity   CATARACT EXTRACTION W/PHACO Left 10/10/2018   Procedure: CATARACT EXTRACTION PHACO AND INTRAOCULAR LENS PLACEMENT (IOC)  LEFT;  Surgeon: Annell Kidney, MD;  Location: [facility];  Service: Ophthalmology;  Laterality: Left;  Latex sensitivity requests arrival around 10 or 10:30   COLONOSCOPY      Assessment & Plan Clinical Impression:  Anita Reilly is a 78 year old right-handed female with history of right face Bell's palsy, hypertension, hyperlipidemia, restless leg syndrome, GERD, asthma.  Per chart review patient lives alone.  One-step to entry of home.  Independent driving prior to admission.  [facility]ed [facility] with acute left-sided weakness/question syncopal event and altered mental status.  Cranial CT scan negative for acute changes.  CTA showed no emergent large vessel occlusion or high-grade stenosis.  Patient  did receive TNK.  MRI 04/28/2024 showed no acute intracranial abnormality.  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  Admission chemistries unremarkable except BUN 28 creatinine 1.61, hemoglobin A1c 6.2.  Echocardiogram with ejection fraction of 65 to 70% no wall motion abnormalities grade 1 diastolic dysfunction.  Neurology follow-up placed on aspirin  81 mg daily and Plavix  75 mg daily for CVA prophylaxis x 3 weeks then Plavix  alone.  Tolerating a regular consistency diet.  Therapy evaluations completed due to patient decreased functional ability left-sided weakness was admitted for a comprehensive rehab program. Patient transferred to CIR on 05/03/2024 .    Patient currently requires min with basic self-care skills secondary to muscle weakness, decreased cardiorespiratoy endurance, decreased coordination and decreased motor planning, and decreased standing balance, hemiplegia, and decreased balance strategies.  Prior to hospitalization, patient could complete self-care independently.  Patient will benefit from skilled intervention to increase independence with basic self-care skills and increase level of independence with iADL prior to discharge home independently.  Anticipate patient will require intermittent supervision and follow up outpatient.  OT - End of Session Activity Tolerance: Tolerates 10 - 20 min activity with multiple rests Endurance Deficit: Yes Endurance Deficit Description: fatigued after ambulating OT Assessment Rehab Potential (ACUTE ONLY): Good OT Barriers to Discharge: Home environment access/layout OT Patient demonstrates impairments in the following area(s): Balance;Endurance;Motor OT Basic ADL's Functional Problem(s): Bathing;Dressing;Toileting OT Advanced ADL's Functional Problem(s): Simple Meal Preparation OT Transfers Functional Problem(s):  Tub/Shower;Toilet OT Additional Impairment(s): None OT Plan OT Intensity: Minimum of 1-2 x/day, 45 to 90 minutes  OT Frequency: 5 out of 7 days OT Duration/Estimated Length of Stay: 7-10 days OT Treatment/Interventions: Balance/vestibular training;Discharge planning;Pain management;Self Care/advanced ADL retraining;Therapeutic Activities;UE/LE Coordination activities;Disease mangement/prevention;Functional mobility training;Patient/family education;Therapeutic Exercise;Community reintegration;DME/adaptive equipment instruction;Neuromuscular re-education;UE/LE Strength taining/ROM OT Self Feeding Anticipated Outcome(s): Independent OT Basic Self-Care Anticipated Outcome(s): Mod I OT Toileting Anticipated Outcome(s): Mod I OT Bathroom Transfers Anticipated Outcome(s): Mod I OT Recommendation Recommendations for Other Services: Therapeutic Recreation consult Therapeutic Recreation Interventions: Pet therapy;Stress management;Outing/community reintergration Patient destination: Home Follow Up Recommendations: Outpatient OT Equipment Recommended: Tub/shower bench   OT Evaluation Precautions/Restrictions  Precautions Precautions: Fall Precaution/Restrictions Comments: L hemi Restrictions Weight Bearing Restrictions Per Provider Order: No Home Living/Prior Functioning Home Living Family/patient expects to be discharged to:: Private residence Living Arrangements: Alone Available Help at Discharge: Family, Available PRN/intermittently (son and DIL live 7 mins away. However potential plan for children to stay at night and cousin to stay during day if needed) Type of Home: House Home Access: Stairs to enter, Ramped entrance Secretary/administrator of Steps: 1 STE after ramp Entrance Stairs-Rails: Right Home Layout: Two level, Full bath on main level, Able to live on main level with bedroom/bathroom Alternate Level Stairs-Number of Steps: 6, landing and 6 Alternate Level Stairs-Rails:  Left Bathroom Shower/Tub: Tub/shower unit, Walk-in shower, Engineer, building services: Pharmacist, community: Yes  Lives With: Alone IADL History Homemaking Responsibilities: Yes Meal Prep Responsibility: Careers adviser Responsibility: Primary Current License: Yes Occupation: Retired 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 [employer] and Hobbies: Going out to eat, church Prior Function Level of Independence: Independent with basic ADLs, Independent with transfers, Independent with homemaking with ambulation, Independent with gait  Able to Take Stairs?: Yes Driving: Yes Vision Baseline Vision/History: 1 Wears glasses Ability to See in Adequate Light: 0 Adequate Patient Visual Report: No change from baseline Vision Assessment?: Wears glasses for reading;No apparent visual deficits Perception  Perception: Within Functional Limits Praxis Praxis: WFL Cognition Cognition Overall Cognitive Status: Within Functional Limits for tasks assessed Arousal/Alertness: Awake/alert Orientation Level: Person;Place;Situation Person: Oriented Place: Oriented Situation: Oriented Memory: Appears intact Awareness: Appears intact Problem Solving: Appears intact Safety/Judgment: Appears intact Brief Interview for Mental Status (BIMS) Repetition of Three Words (First Attempt): 3 Temporal Orientation: Year: Correct Temporal Orientation: Month: Accurate within 5 days Temporal Orientation: Day: Correct Recall: Sock: Yes, no cue required Recall: Blue: Yes, no cue required Recall: Bed: Yes, no cue required BIMS Summary Score: 15 Sensation Sensation Light Touch: Appears Intact Hot/Cold: Not tested Proprioception: Appears Intact Stereognosis: Not tested Coordination Gross Motor Movements are Fluid and Coordinated: No Fine Motor Movements are Fluid and Coordinated: Yes Motor  Motor Motor: Hemiplegia;Ataxia Motor - Skilled Clinical Observations: L hemi, ataxia,  decreased balance/coordination, and weakness  Trunk/Postural Assessment  Cervical Assessment Cervical Assessment: Within Functional Limits Thoracic Assessment Thoracic Assessment: Within Functional Limits Lumbar Assessment Lumbar Assessment: Exceptions to [facility] (posterior pelvic tilt) Postural Control Postural Control: Deficits on evaluation Protective Responses: slightly delayed on L  Balance Balance Balance Assessed: Yes Static Sitting Balance Static Sitting - Balance Support: Feet supported;Bilateral upper extremity supported Static Sitting - Level of Assistance: 6: Modified independent (Device/Increase time) Dynamic Sitting Balance Dynamic Sitting - Balance Support: Feet supported;No upper extremity supported Dynamic Sitting - Level of Assistance: 5: Stand by assistance (distant supervision) Static Standing Balance Static Standing - Balance Support: No upper extremity supported;During functional activity Static Standing - Level of Assistance: 5: Stand by assistance (CGA) Dynamic Standing Balance Dynamic Standing - Balance Support: No upper extremity supported;During  functional activity Dynamic Standing - Level of Assistance: 4: Min assist Dynamic Standing - Comments: with transfers Extremity/Trunk Assessment RUE Assessment RUE Assessment: Within Functional Limits LUE Assessment LUE Assessment: Exceptions to [facility] Active Range of Motion (AROM) Comments: WFL General Strength Comments: 4+/5 grossly  Care Tool Care Tool Self Care Eating   Eating Assist Level: Set up assist    Oral Care    Oral Care Assist Level: Set up assist    Bathing   Body parts bathed by patient: Right arm;Left arm;Chest;Abdomen;Front perineal area;Buttocks;Right upper leg;Left upper leg;Right lower leg;Left lower leg;Face     Assist Level: Contact Guard/Touching assist    Upper Body Dressing(including orthotics)   What is the patient wearing?: Pull over shirt   Assist Level: Set up assist     Lower Body Dressing (excluding footwear)   What is the patient wearing?: Pants;Underwear/pull up Assist for lower body dressing: Contact Guard/Touching assist    Putting on/Taking off footwear   What is the patient wearing?: Shoes Assist for footwear: Contact Guard/Touching assist       Care Tool Toileting Toileting activity   Assist for toileting: Contact Guard/Touching assist     Care Tool Bed Mobility Roll left and right activity   Roll left and right assist level: Independent with assistive device    Sit to lying activity   Sit to lying assist level: Minimal Assistance - Patient > 75%    Lying to sitting on side of bed activity   Lying to sitting on side of bed assist level: the ability to move from lying on the back to sitting on the side of the bed with no back support.: Contact Guard/Touching assist     Care Tool Transfers Sit to stand transfer   Sit to stand assist level: Contact Guard/Touching assist    Chair/bed transfer         Toilet transfer   Assist Level: Contact Guard/Touching assist     Care Tool Cognition  Expression of Ideas and Wants Expression of Ideas and Wants: 4. Without difficulty (complex and basic) - expresses complex messages without difficulty and with speech that is clear and easy to understand  Understanding Verbal and Non-Verbal Content Understanding Verbal and Non-Verbal Content: 4. Understands (complex and basic) - clear comprehension without cues or repetitions   Memory/Recall Ability Memory/Recall Ability : Current season;That he or she is in a hospital/hospital unit;Staff names and faces;Location of own room   Refer to Care Plan for Long Term Goals  SHORT TERM GOAL WEEK 1 OT Short Term Goal 1 (Week 1): Pt will complete tub transfer with TTB with supervision OT Short Term Goal 2 (Week 1): Pt will complete toilet transfer with supervision  Recommendations for other services: Therapeutic Recreation  Pet therapy, Stress management,  and Outing/community reintegration   Skilled Therapeutic Intervention Patient received upright in bed upon therapy arrival and agreeable to participate in OT evaluation. Education provided on OT purpose, therapy schedule, goals for therapy, and safety policy while in rehab. Slight headache and nausea reported; nurse notified of med request. OT offered rest breaks, diet ginger ale, saltine crackers for pain/nausea management.  Pt had completed all ADLs with nursing prior to OT arrival, however per simulation and pt report, pt CGA overall for al bathing/dressing and toileting. Completed CGA sit > stands without AD, and CGA ambulatory transfers at the home distance level with CGA without AD and intermittent L HHA. Pt often seeking external support for confidence. Cues needed throughout for  advancing LLE but no formal LOB. Pt did verbalize feeling of lightheadedness and requested OT to check BP due to history of CVA. Vitals WNL- see below. Education provided on BEFAST acronym for CVA identification. Transported > ADL bathroom. Pt was able to ambulate <> TTB, then required min A to manage LLE over tub threshold due to weakness. Education provided on use of TTB (pt apprehensive about stepping over threshold at baseline) for tub transfers, as well as [facility] management, lateral leans to reduce standing/fall risk and shower curtain management. Back in room, pt remained seated in w/c at conclusion of session with chair alarm on and all needs met at end of session.  Patient demonstrates L hemiplegia (LLE > LUE), dynamic standing balance, and gross motor coordination deficits resulting in difficulty completing BADL tasks without increased physical assist. Pt will benefit from skilled OT services to focus on mentioned deficits.  Vitals BP 128/75 (sitting EOB), HR 75 bpm     ADL ADL Eating: Set up Where Assessed-Eating: Wheelchair Grooming: Setup Where Assessed-Grooming: Sitting at sink Upper Body Bathing:  Setup Where Assessed-Upper Body Bathing: Sitting at sink Lower Body Bathing: Contact guard Where Assessed-Lower Body Bathing: Sitting at sink;Standing at sink Upper Body Dressing: Setup Where Assessed-Upper Body Dressing: Sitting at sink Lower Body Dressing: Contact guard Where Assessed-Lower Body Dressing: Sitting at sink;Standing at sink Toileting: Contact guard Where Assessed-Toileting: Teacher, adult education: Furniture conservator/restorer Method: Insurance claims handler: Minimal Radiation protection practitioner Method: Ship broker: Insurance underwriter: Unable to Engineer, technical sales Method: Unable to assess Mobility  Transfers Sit to Stand: Minimal Assistance - Patient > 75% Stand to Sit: Contact Guard/Touching assist   Discharge Criteria: Patient will be discharged from OT if patient refuses treatment 3 consecutive times without medical reason, if treatment goals not met, if there is a change in medical status, if patient makes no progress towards goals or if patient is discharged from hospital.  The above assessment, treatment plan, treatment alternatives and goals were discussed and mutually agreed upon: by patient  Ruthanna Covert, MS, OTR/L  05/04/2024, 12:30 PM

## 2024-05-04 NOTE — Progress Notes (Signed)
 Physical Therapy Session Note  Patient Details  Name: Jalexa Pifer MRN: [id number] Date of Birth: Mar 24, 1946  Today's Date: 05/04/2024 PT Individual Time: 1500-1555 PT Individual Time Calculation (min): 55 min   Short Term Goals: Week 1:  PT Short Term Goal 1 (Week 1): STG=LTG due to LOS  Skilled Therapeutic Interventions/Progress Updates:   Chart reviewed and pt agreeable to therapy. Pt received seated in WC with no c/o pain. Also of note, pt requested toileting and required CGA + RW to/from toilet. Session focused on gait quality and LLE NMR to promote functional recovery for home ambulation. Pt initiated session with amb of 50 + 40 ft using CGA + RW. Pt noted to have LLE foot drag related to poor hip.knee flexion. Pt then completed series of seated hip fl and knee ext. Pt noted to have increased AROM of hip fl with continued repetitions. Pt also demonstrated increased control of knee ext and eccentric lowering with increased repetition. Pt noted to have good control of LLE DF. Pt instructed on repeated hip fl and knee ext exercises between therapy sessions. Pt then amb another 28ft + 68ft using CGA + Rw and increased control of LLE hip/knee mobility, though still displaying some deficit. Session education emphasized fall prevention. At end of session, pt was left semi-reclined in bed with alarm engaged, nurse call bell and all needs in reach.   Therapy Documentation Precautions:  Precautions Precautions: Fall Precaution/Restrictions Comments: L hemi Restrictions Weight Bearing Restrictions Per Provider Order: No General:       Therapy/Group: Individual Therapy  Dariusz Brase G Tezra Mahr 05/04/2024, 4:03 PM

## 2024-05-05 DIAGNOSIS — R739 Hyperglycemia, unspecified: Secondary | ICD-10-CM | POA: Diagnosis not present

## 2024-05-05 DIAGNOSIS — K5901 Slow transit constipation: Secondary | ICD-10-CM | POA: Diagnosis not present

## 2024-05-05 DIAGNOSIS — I6381 Other cerebral infarction due to occlusion or stenosis of small artery: Secondary | ICD-10-CM | POA: Diagnosis not present

## 2024-05-05 DIAGNOSIS — I10 Essential (primary) hypertension: Secondary | ICD-10-CM | POA: Diagnosis not present

## 2024-05-05 LAB — GLUCOSE, CAPILLARY: Glucose-Capillary: 121 mg/dL — ABNORMAL HIGH (ref 70–99)

## 2024-05-05 NOTE — Plan of Care (Signed)
  Problem: Consults Goal: RH STROKE PATIENT EDUCATION Description: See Patient Education module for education specifics  Outcome: Progressing Note: Going through hospital timeline and answering questions patient has with the timeline and how their transition from hospital to home will go. Pt very eager to learn and can repeat back education verbatum   Problem: RH BOWEL ELIMINATION Goal: RH STG MANAGE BOWEL WITH ASSISTANCE Description: STG Manage Bowel with toileting Assistance. Outcome: Progressing Goal: RH STG MANAGE BOWEL W/MEDICATION W/ASSISTANCE Description: STG Manage Bowel with Medication with mod I  Assistance. Outcome: Progressing   Problem: RH SAFETY Goal: RH STG ADHERE TO SAFETY PRECAUTIONS W/ASSISTANCE/DEVICE Description: STG Adhere to Safety Precautions With cues Assistance/Device. Outcome: Adequate for Discharge Note: Pt following all rules and not impulsive, obeys call dont fall. Can repeat back how to safely transfer, and already knows how to educate other people how to help her   Problem: RH PAIN MANAGEMENT Goal: RH STG PAIN MANAGED AT OR BELOW PT'S PAIN GOAL Description: < 4 with prns Outcome: Adequate for Discharge Note: No apparent pain issues that need to be addressed during day shift

## 2024-05-05 NOTE — Discharge Summary (Signed)
 Physician Discharge Summary  Patient ID: Anita Reilly MRN: [id number] DOB/AGE: 80/07/1946 80 y.o.  Admit date: 05/03/2024 Discharge date: 05/09/2024  Discharge Diagnoses:  Principal Problem:   Lacunar infarction [facility]) Hypertension Hyperlipidemia History of right face Bell's palsy GERD Question borderline diabetes mellitus Constipation Restless leg syndrome Asthma  Discharged Condition: Stable  Significant Diagnostic Studies: CT HEAD WO CONTRAST ( ) Result Date: 04/28/2024 CLINICAL DATA:  Stroke follow-up EXAM: CT HEAD WITHOUT CONTRAST TECHNIQUE: Contiguous axial images were obtained from the base of the skull through the vertex without intravenous contrast. RADIATION DOSE REDUCTION: This exam was performed according to the departmental dose-optimization program which includes automated exposure control, adjustment of the mA and/or kV according to patient size and/or use of iterative reconstruction technique. COMPARISON:  None Available. FINDINGS: Brain: There is no mass, hemorrhage or extra-axial collection. There is generalized atrophy without lobar predilection. Hypodensity of the white matter is most commonly associated with chronic microvascular disease. Vascular: No hyperdense vessel or unexpected vascular calcification. Skull: The visualized skull base, calvarium and extracranial soft tissues are normal. Sinuses/Orbits: No fluid levels or advanced mucosal thickening of the visualized paranasal sinuses. No mastoid or middle ear effusion. Normal orbits. Other: None. IMPRESSION: 1. No acute intracranial abnormality. 2. Generalized atrophy and findings of chronic microvascular disease. Electronically Signed   By: Franky Stanford M.D.   On: 04/28/2024 23:10   ECHOCARDIOGRAM COMPLETE Result Date: 04/28/2024    ECHOCARDIOGRAM REPORT   Patient Name:   Anita Reilly San Date of Exam: 04/28/2024 Medical Rec #:  [id number]            Height:       64.0 in Accession #:    [id number]            Weight:       175.5 lb Date of Birth:  Jul 08, 1946             BSA:          1.851 m Patient Age:    78 years             BP:           122/69 mmHg Patient Gender: F                    HR:           76 bpm. Exam Location:  [facility] Procedure: 2D Echo, Cardiac Doppler, Color Doppler and Intracardiac            Opacification Agent (Both Spectral and Color Flow Doppler were            utilized during procedure). Indications:     Stroke I63.9  History:         Patient has no prior history of Echocardiogram examinations.  Sonographer:     Thedora Louder RDCS, FASE Referring Phys:  [id number] ALMARIE BAKE OUMA Diagnosing Phys: Wilbert Bihari MD  Sonographer Comments: Technically difficult study due to poor echo windows, suboptimal parasternal window and suboptimal apical window. Image acquisition challenging due to respiratory motion. IMPRESSIONS  1. Left ventricular ejection fraction, by estimation, is 65 to 70%. The left ventricle has normal function. The left ventricle has no regional wall motion abnormalities. Left ventricular diastolic parameters are consistent with Grade I diastolic dysfunction (impaired relaxation).  2. Right ventricular systolic function is normal. The right ventricular size is normal.  3. The mitral valve is normal in structure. No evidence of mitral valve regurgitation. No evidence of mitral  stenosis.  4. The aortic valve is normal in structure. Aortic valve regurgitation is not visualized. No aortic stenosis is present. Conclusion(s)/Recommendation(s): No intracardiac source of embolism detected on this transthoracic study. Consider a transesophageal echocardiogram to exclude cardiac source of embolism if clinically indicated. FINDINGS  Left Ventricle: Left ventricular ejection fraction, by estimation, is 65 to 70%. The left ventricle has normal function. The left ventricle has no regional wall motion abnormalities. Definity  contrast agent was given IV to delineate the left ventricular  endocardial  borders. The left ventricular internal cavity size was normal in size. There is no left ventricular hypertrophy. Left ventricular diastolic parameters are consistent with Grade I diastolic dysfunction (impaired relaxation). Normal left ventricular filling pressure. Right Ventricle: The right ventricular size is normal. No increase in right ventricular wall thickness. Right ventricular systolic function is normal. Left Atrium: Left atrial size was normal in size. Right Atrium: Right atrial size was normal in size. Pericardium: There is no evidence of pericardial effusion. Mitral Valve: The mitral valve is normal in structure. No evidence of mitral valve regurgitation. No evidence of mitral valve stenosis. Tricuspid Valve: The tricuspid valve is normal in structure. Tricuspid valve regurgitation is not demonstrated. No evidence of tricuspid stenosis. Aortic Valve: The aortic valve is normal in structure. Aortic valve regurgitation is not visualized. No aortic stenosis is present. Aortic valve peak gradient measures 10.8 mmHg. Pulmonic Valve: The pulmonic valve was normal in structure. Pulmonic valve regurgitation is not visualized. No evidence of pulmonic stenosis. Aorta: The aortic root is normal in size and structure. Venous: The inferior vena cava was not well visualized. IAS/Shunts: The interatrial septum appears to be lipomatous. No atrial level shunt detected by color flow Doppler.  LEFT VENTRICLE PLAX 2D LVIDd:         3.90 cm     Diastology LVIDs:         2.80 cm     LV e' medial:    6.31 cm/s LV PW:         1.00 cm     LV E/e' medial:  8.4 LV IVS:        0.70 cm     LV e' lateral:   6.85 cm/s LVOT diam:     1.80 cm     LV E/e' lateral: 7.7 LV SV:         68 LV SV Index:   37 LVOT Area:     2.54 cm  LV Volumes (MOD) LV vol d, MOD A2C: 48.0 ml LV vol d, MOD A4C: 56.3 ml LV vol s, MOD A2C: 17.9 ml LV vol s, MOD A4C: 20.0 ml LV SV MOD A2C:     30.1 ml LV SV MOD A4C:     56.3 ml LV SV MOD BP:      34.0 ml RIGHT  VENTRICLE RV Basal diam:  2.40 cm LEFT ATRIUM           Index        RIGHT ATRIUM          Index LA diam:      3.20 cm 1.73 cm/m   RA Area:     7.41 cm LA Vol (A4C): 22.0 ml 11.89 ml/m  RA Volume:   10.30 ml 5.57 ml/m  AORTIC VALVE                 PULMONIC VALVE AV Area (Vmax): 2.00 cm     PV Vmax:  1.24 m/s AV Vmax:        164.00 cm/s  PV Peak grad:   6.2 mmHg AV Peak Grad:   10.8 mmHg    RVOT Peak grad: 4 mmHg LVOT Vmax:      129.00 cm/s LVOT Vmean:     90.700 cm/s LVOT VTI:       0.266 m  AORTA Ao Root diam: 2.90 cm MITRAL VALVE MV Area (PHT): 2.37 cm    SHUNTS MV Decel Time: 320 msec    Systemic VTI:  0.27 m MV E velocity: 52.90 cm/s  Systemic Diam: 1.80 cm MV A velocity: 93.80 cm/s MV E/A ratio:  0.56 Wilbert Bihari MD Electronically signed by Wilbert Bihari MD Signature Date/Time: 04/28/2024/5:10:14 PM    Final    MR BRAIN WO CONTRAST Result Date: 04/28/2024 CLINICAL DATA:  Stroke follow-up.  Left facial droop EXAM: MRI HEAD WITHOUT CONTRAST TECHNIQUE: Multiplanar, multiecho pulse sequences of the brain and surrounding structures were obtained without intravenous contrast. COMPARISON:  08/16/2012 FINDINGS: Brain: No acute infarct, mass effect or extra-axial collection. No acute or chronic hemorrhage. There is multifocal hyperintense T2-weighted signal within the white matter. Parenchymal volume and CSF spaces are normal. The midline structures are normal. Vascular: Normal flow voids. Skull and upper cervical spine: Normal calvarium and skull base. Visualized upper cervical spine and soft tissues are normal. Sinuses/Orbits:No paranasal sinus fluid levels or advanced mucosal thickening. No mastoid or middle ear effusion. Normal orbits. IMPRESSION: 1. No acute intracranial abnormality. 2. Chronic microangiopathic white matter changes. Electronically Signed   By: Franky Stanford M.D.   On: 04/28/2024 00:48   CT ANGIO HEAD NECK W WO CM W PERF (CODE STROKE) Result Date: 04/27/2024 CLINICAL DATA:  Acute  neurologic deficit EXAM: CT ANGIOGRAPHY HEAD AND NECK CT PERFUSION BRAIN TECHNIQUE: Multidetector CT imaging of the head and neck was performed using the standard protocol during bolus administration of intravenous contrast. Multiplanar CT image reconstructions and MIPs were obtained to evaluate the vascular anatomy. Carotid stenosis measurements (when applicable) are obtained utilizing NASCET criteria, using the distal internal carotid diameter as the denominator. Multiphase CT imaging of the brain was performed following IV bolus contrast injection. Subsequent parametric perfusion maps were calculated using RAPID software. RADIATION DOSE REDUCTION: This exam was performed according to the departmental dose-optimization program which includes automated exposure control, adjustment of the mA and/or kV according to patient size and/or use of iterative reconstruction technique. CONTRAST:  OMNIPAQUE  IOHEXOL  350 MG/ML SOLN COMPARISON:  Head CT 04/27/2024 FINDINGS: CTA NECK FINDINGS Skeleton: No acute abnormality or high grade bony spinal canal stenosis. Other neck: Normal pharynx, larynx and major salivary glands. No cervical lymphadenopathy. Unremarkable thyroid gland. Upper chest: No pneumothorax or pleural effusion. No nodules or masses. Aortic arch: There is calcific atherosclerosis of the aortic arch. Conventional 3 vessel aortic branching pattern. RIGHT carotid system: Normal without aneurysm, dissection or stenosis. LEFT carotid system: Normal without aneurysm, dissection or stenosis. Vertebral arteries: Codominant configuration. There is no dissection, occlusion or flow-limiting stenosis to the skull base (V1-V3 segments). CTA HEAD FINDINGS POSTERIOR CIRCULATION: Vertebral arteries are normal. No proximal occlusion of the anterior or inferior cerebellar arteries. Basilar artery is normal. Superior cerebellar arteries are normal. Posterior cerebral arteries are normal. ANTERIOR CIRCULATION: Intracranial  internal carotid arteries are normal. Anterior cerebral arteries are normal. Middle cerebral arteries are normal. Venous sinuses: As permitted by contrast timing, patent. Anatomic variants: None Review of the MIP images confirms the above findings. CT Brain Perfusion Findings:  ASPECTS: 10 CBF (<30%) Volume: 0mL Perfusion (Tmax>6.0s) volume: 0mL Mismatch Volume: 0mL Infarction Location:None IMPRESSION: 1. No emergent large vessel occlusion or high-grade stenosis of the intracranial arteries. 2. Normal CT perfusion. Aortic Atherosclerosis (ICD10-I70.0). Electronically Signed   By: Franky Stanford M.D.   On: 04/27/2024 22:29   CT HEAD CODE STROKE WO CONTRAST Result Date: 04/27/2024 CLINICAL DATA:  Code stroke. Neuro deficit, concern for stroke, left-sided facial weakness, slurred speech. EXAM: CT HEAD WITHOUT CONTRAST TECHNIQUE: Contiguous axial images were obtained from the base of the skull through the vertex without intravenous contrast. RADIATION DOSE REDUCTION: This exam was performed according to the departmental dose-optimization program which includes automated exposure control, adjustment of the mA and/or kV according to patient size and/or use of iterative reconstruction technique. COMPARISON:  MRI head 08/16/2012. FINDINGS: Brain: No acute intracranial hemorrhage. No CT evidence of acute infarct. No edema, mass effect, or midline shift. The basilar cisterns are patent. Ventricles: The ventricles are normal. Vascular: Atherosclerotic calcifications of the carotid siphons and intracranial vertebral arteries. No hyperdense vessel. Skull: No acute or aggressive finding. Orbits: Bilateral lens replacement. Sinuses: The visualized paranasal sinuses are clear. Other: Mastoid air cells are clear. ASPECTS [facility] Stroke Program Early CT Score) - Ganglionic level infarction (caudate, lentiform nuclei, internal capsule, insula, M1-M3 cortex): 7 - Supraganglionic infarction (M4-M6 cortex): 3 Total score (0-10 with 10  being normal): 10 IMPRESSION: 1. No CT evidence of acute intracranial abnormality. 2. ASPECTS is 10 These results were called by telephone at the time of interpretation on 04/27/2024 at 9:55 pm to provider [facility] , who verbally acknowledged these results. Electronically Signed   By: Donnice Mania M.D.   On: 04/27/2024 21:55    Labs:  Basic Metabolic Panel: Recent Labs  Lab 05/02/24 0534 05/06/24 0505  NA 134* 136  K 4.5 4.4  CL 103 102  CO2 22 24  GLUCOSE 108* 100*  BUN 20 18  CREATININE 1.01* 1.05*  CALCIUM  8.9 9.0    CBC: Recent Labs  Lab 05/02/24 0534 05/06/24 0505  WBC 6.3 5.4  NEUTROABS  --  2.4  HGB 13.2 13.8  HCT 38.2 40.0  MCV 90.1 92.4  PLT 252 360    CBG: Recent Labs  Lab 05/03/24 2204 05/04/24 0601 05/05/24 2014  GLUCAP 134* 105* 121*    Brief HPI:   Anita Reilly is a 78 y.o. right-handed female with history significant for right face Bell's palsy hypertension hyperlipidemia restless leg syndrome GERD and asthma.  Per chart review lives alone.  Independent prior to admission.  [facility]ed to [facility]/27/2024 with acute left-sided weakness question syncopal event and altered mental status.  Cranial CT scan negative for acute changes.  CTA showed no emergent large vessel occlusion or high-grade stenosis.  Patient did receive TNK.  MRI 04/28/2024 showed no acute intracranial abnormality.  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  Admission chemistries unremarkable except BUN 28 creatinine 1.61 hemoglobin A1c 6.2.  Echocardiogram with ejection fraction of 65 to 70% no wall motion abnormalities grade 1 diastolic dysfunction.  Neurology follow-up maintained on low-dose aspirin  and Plavix  x 3 weeks for CVA prophylaxis then Plavix  alone.  Therapy evaluations completed due to patient decreased functional ability left-sided weakness was admitted for a  comprehensive rehab program.   Hospital Course: Anita Reilly was admitted to rehab 05/03/2024 for inpatient therapies to consist of PT, ST and  OT at least three hours five days a week. Past admission physiatrist, therapy team and rehab RN have worked together to provide customized collaborative inpatient rehab.  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  Remained stable status post TNK.  Continued on low-dose aspirin  and Plavix  x 3 weeks then Plavix  alone follow-up neurology services.  Pain management use of Lyrica  twice daily.  He was using trazodone  as needed for sleep.  Blood pressure controlled on Cozaar  and monitored with increased mobility.  Lipitor  ongoing for hyperlipidemia.  History of right face Bell's palsy again continues on low-dose Lyrica  25 mg twice daily follow-up outpatient.  Protonix  ongoing for GERD.  Borderline diabetes mellitus hemoglobin A1c 6.2 monitoring with SSI and started on metformin  at time of discharge.  Bouts of constipation resolved with laxative assistance.   Blood pressures were monitored on TID basis and remained controlled and monitored  Diabetes has been monitored with ac/hs CBG checks and SSI was use prn for tighter BS control.    Rehab course: During patient's stay in rehab weekly team conferences were held to monitor patient's progress, set goals and discuss barriers to discharge. At admission, patient required minimal assist 200 feet rolling walker contact-guard assist sit to stand  He/She  has had improvement in activity tolerance, balance, postural control as well as ability to compensate for deficits. He/She has had improvement in functional use RUE/LUE  and RLE/LLE as well as improvement in awareness.  ADLs completes all self-care with levels of assistance toileting with distant supervision on standard toilet.  Performs all transfers with supervision.  Tub shower transfers with supervision.  Gathers belongings for  activities a day living and homemaking.  Ambulates 150 feet contact-guard no assistive device.  Can increase her ambulation up to 750 feet through the hospital.  Minimal cues for safety and rollator.  Full family teaching completed plan discharge to home       Disposition:  Discharge disposition: 01-Home or Self Care        Diet: Diabetic diet  Special Instructions: No driving smoking or alcohol  Medications at discharge. 1.  Tylenol  as needed 2.  Aspirin  81 mg p.o. daily until 05/20/2024 and stop 3.  Lipitor  40 mg p.o. nightly 4.  Plavix  75 mg p.o. daily 5.  Cozaar  50 mg p.o. daily 6.  Protonix  40 mg p.o. daily 7.  MiraLAX  daily as needed hold for loose stools 8.  Lyrica  25 mg p.o. twice daily 9.  Trazodone  50 mg p.o. nightly as needed sleep 10.  Calcium  carbonate 1 tablet daily 11.  Fortamet  500 mg twice daily  30-35 minutes are spent completing discharge summary and discharge planning  Discharge Instructions     Ambulatory referral to Neurology   Complete by: As directed    An appointment is requested in approximately: 4 weeks lacunar infarction   Ambulatory referral to Physical Medicine Rehab   Complete by: As directed    Moderate complexity follow-up 1 to 2 weeks right lacunar infarction        Follow-up Information     Raulkar, Sven SQUIBB, MD Follow up.   Specialty: Physical Medicine and Rehabilitation Why: Office to call for appointment Contact information: [address] [place] [address] [phone]                 Signed: Toribio JINNY Pitch 05/09/2024, 4:46 AM

## 2024-05-05 NOTE — Progress Notes (Signed)
 PROGRESS NOTE   Subjective/Complaints: Pt without complaints this morning. Struggling to find vegetarian choices within CM diet.   ROS: Patient denies fever, rash, sore throat, blurred vision, dizziness, nausea, vomiting, diarrhea, cough, shortness of breath or chest pain, joint or back/neck pain, headache, or mood change.    Objective:   No results found. No results for input(s): WBC, HGB, HCT, PLT in the last 72 hours. No results for input(s): NA, K, CL, CO2, GLUCOSE, BUN, CREATININE, CALCIUM  in the last 72 hours.  Intake/Output Summary (Last 24 hours) at 05/05/2024 0847 Last data filed at 05/05/2024 0800 Gross per 24 hour  Intake 1252 ml  Output --  Net 1252 ml        Physical Exam: Vital Signs Blood pressure 114/79, pulse 74, temperature 98.3 F (36.8 C), temperature source Oral, resp. rate 18, SpO2 92%.  General: Alert and oriented x 3, No apparent distress HEENT: Head is normocephalic, atraumatic, PERRLA, EOMI, sclera anicteric, oral mucosa pink and moist, dentition intact, ext ear canals clear,  Neck: Supple without JVD or lymphadenopathy Heart: Reg rate and rhythm. No murmurs rubs or gallops Chest: CTA bilaterally without wheezes, rales, or rhonchi; no distress Abdomen: Soft, non-tender, non-distended, bowel sounds positive. Extremities: No clubbing, cyanosis, or edema. Pulses are 2+ Psych: Pt's affect is appropriate. Pt is cooperative Skin: Clean and intact without signs of breakdown Neuro:  Alert and oriented x 3. Normal insight and awareness. Intact Memory. Normal language. Sl dysarthric speech. Cranial nerve exam unremarkable except for mild peripheral VII signs on right and central VII on left. MMT: RUE 4+to 5/5. LUE 4/5 deltoid, 4+ bicep,tricep, wrist, hand.  RLE 4+ to 5/5. LLE 2+ to 3/5 HF, KE and 4/5 ADF/PF. Sensory exam normal for light touch and pain in all 4 limbs. No limb ataxia  or cerebellar signs. No abnormal tone appreciated.   Musculoskeletal: Full ROM, No pain with AROM or PROM in the neck, trunk, or extremities. Posture appropriate     Assessment/Plan: 1. Functional deficits which require 3+ hours per day of interdisciplinary therapy in a comprehensive inpatient rehab setting. Physiatrist is providing close team supervision and 24 hour management of active medical problems listed below. Physiatrist and rehab team continue to assess barriers to discharge/monitor patient progress toward functional and medical goals  Care Tool:  Bathing    Body parts bathed by patient: Right arm, Left arm, Chest, Abdomen, Front perineal area, Buttocks, Right upper leg, Left upper leg, Right lower leg, Left lower leg, Face         Bathing assist Assist Level: Contact Guard/Touching assist     Upper Body Dressing/Undressing Upper body dressing   What is the patient wearing?: Pull over shirt    Upper body assist Assist Level: Set up assist    Lower Body Dressing/Undressing Lower body dressing      What is the patient wearing?: Pants, Underwear/pull up     Lower body assist Assist for lower body dressing: Contact Guard/Touching assist     Toileting Toileting    Toileting assist Assist for toileting: Contact Guard/Touching assist     Transfers Chair/bed transfer  Transfers assist     Chair/bed transfer  assist level: Minimal Assistance - Patient > 75%     Locomotion Ambulation   Ambulation assist      Assist level: Moderate Assistance - Patient 50 - 74% Assistive device: No Device Max distance: 11ft   Walk 10 feet activity   Assist     Assist level: Moderate Assistance - Patient - 50 - 74% Assistive device: No Device   Walk 50 feet activity   Assist Walk 50 feet with 2 turns activity did not occur: Safety/medical concerns (fatigue, weakness)         Walk 150 feet activity   Assist Walk 150 feet activity did not occur:  Safety/medical concerns (fatigue, weakness)         Walk 10 feet on uneven surface  activity   Assist     Assist level: Minimal Assistance - Patient > 75% Assistive device: Other (comment) (RUE support)   Wheelchair     Assist Is the patient using a wheelchair?: Yes Type of Wheelchair: Manual    Wheelchair assist level: Dependent - Patient 0% Max wheelchair distance: 110ft    Wheelchair 50 feet with 2 turns activity    Assist        Assist Level: Dependent - Patient 0%   Wheelchair 150 feet activity     Assist      Assist Level: Dependent - Patient 0%   Blood pressure 114/79, pulse 74, temperature 98.3 F (36.8 C), temperature source Oral, resp. rate 18, SpO2 92%.  Medical Problem List and Plan: 1. 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  Status post TNK             -patient may shower             -ELOS/Goals: 7+ days, mod I to supervision goals  -Continue CIR therapies including PT, OT, and SLP  2.  Antithrombotics: -DVT/anticoagulation:  Mechanical: Antiembolism stockings, thigh (TED hose) Bilateral lower extremities             -antiplatelet therapy: Aspirin  81 mg daily and Plavix  75 mg daily x 3 weeks then Plavix  alone 3. Pain Management: Lyrica  25 mg twice daily             -pain controlled 4. Mood/Behavior/Sleep: Trazodone  50 mg nightly as needed.  Provide emotional support             -antipsychotic agents: N/A 5. Neuropsych/cognition: This patient is capable of making decisions on her own behalf. 6. Skin/Wound Care: Routine skin checks 7. Fluids/Electrolytes/Nutrition: Routine in and outs with follow-up chemistries 8.  Hypertension.  Cozaar  50 mg daily.  Monitor with increased mobility 9.  Hyperlipidemia.  Lipitor  10.  History of right face Bell's palsy.  Maintained on Lyrica  25 mg twice daily follow-up as outpatient 11.  GERD.  Protonix  12.  ?Borderline DM, Hemoglobin A1c 6.2.   Consider  adding metformin.             -changed cbg's to daily, staggered, QAM and QSupper every other day  -will change diet to regular so that she can have vegetables. Pt will avoid sugars, carbs.             -no SSI 13.  Constipation.  MiraLAX  daily, Senokot S,1 tablet at bedtime as needed   -LBM 6/14  LOS: 2 days A FACE TO FACE EVALUATION WAS PERFORMED  Rawland Caddy 05/05/2024, 8:47 AM

## 2024-05-05 NOTE — Discharge Instructions (Signed)
 Inpatient Rehab Discharge Instructions  Lachanda Buczek Blackwelder Discharge date and time: No discharge date for patient encounter.   Activities/Precautions/ Functional Status: Activity: As tolerated Diet: Regular Wound Care: Routine skin checks Functional status:  ___ No restrictions     ___ Walk up steps independently ___ 24/7 supervision/assistance   ___ Walk up steps with assistance ___ Intermittent supervision/assistance  ___ Bathe/dress independently ___ Walk with walker     _x__ Bathe/dress with assistance ___ Walk Independently    ___ Shower independently ___ Walk with assistance    ___ Shower with assistance ___ No alcohol     ___ Return to work/school ________  Special Instructions:  No driving smoking or alcohol  My questions have been answered and I understand these instructions. I will adhere to these goals and the provided educational materials after my discharge from the hospital.  Patient/Caregiver Signature _______________________________ Date __________  Clinician Signature _______________________________________ Date __________  Please bring this form and your medication list with you to all your follow-up doctor's appointments.

## 2024-05-05 NOTE — Evaluation (Signed)
 Speech Language Pathology Assessment and Plan  Patient Details  Name: Anita Reilly MRN: [id number] Date of Birth: March 08, 1946  SLP Diagnosis: Cognitive Impairments   Today's Date: 05/05/2024 SLP Individual Time: 1101-1200 SLP Individual Time Calculation (min): 59 min   Hospital Problem: Principal Problem:   Lacunar infarction [facility])  Past Medical History:  Past Medical History:  Diagnosis Date   Arthritis    shoulders   Bell's palsy 2016   Vertigo    none in several years   Past Surgical History:  Past Surgical History:  Procedure Laterality Date   BREAST BIOPSY Left 05/06/2021   u/s bx, hydromark, path pending   CARPAL TUNNEL RELEASE     left    CATARACT EXTRACTION W/PHACO Right 09/19/2018   Procedure: CATARACT EXTRACTION PHACO AND INTRAOCULAR LENS PLACEMENT (IOC) RIGHT;  Surgeon: Annell Kidney, MD;  Location: [facility];  Service: Ophthalmology;  Laterality: Right;  latex sensitivity   CATARACT EXTRACTION W/PHACO Left 10/10/2018   Procedure: CATARACT EXTRACTION PHACO AND INTRAOCULAR LENS PLACEMENT (IOC)  LEFT;  Surgeon: Annell Kidney, MD;  Location: [facility];  Service: Ophthalmology;  Laterality: Left;  Latex sensitivity requests arrival around 10 or 10:30   COLONOSCOPY      Assessment / Plan / Recommendation Clinical Impression HPI: Pt is a 78 y/o female with PMH of Bell's Palsy, HTN, HLD, GERD, asthma who presented to [facility] on 04/27/24 with stroke like symptoms while at a wedding. Pt reports possible syncopal episode, but not sure. In ED, vitals WNL, pt with ongoing L hemiparesis, labs unremarkable. 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 She did receive TNK. Stroke workup was completed and neurology recommended aspirin  and plavix  x 3 weeks, then plavix  alone. Therapy evaluations were completed and pt  was recommended for CIR.   Clinical Impression:  Bedside Swallow Evaluation: A bedside swallow evaluation was completed to assess for s/sx of oropharyngeal dysphagia. Oral mechanism exam WFL. POs administered included thin liquids and solids. Patient with timely mastication and complete oral clearance. No s/sx of aspiration present. Recommend regular/thin diet with use of standardized precautions including sitting upright during PO and taking small bites/sips at a slow rate. No further ST needs regarding dysphagia. Cognitive-Linguistic:  Patient was evaluated via the CLQT (Cognitive-Linguistic Quick Test) to assess cognition and language. Patient with mild deficits in attention, memory, language, and executive functioning skills, though functional language skills noted informally. SLP educated patient on role of speech therapy and general goals targeted in therapy (medication management, money management, etc), however patient reports she is at her baseline level of functioning and would rather participate in PT/OT. According to acute documentation, family agrees this is patients baseline.  Dysarthria: Patient is 100% intelligible at the conversational level  Pt would benefit from skilled ST services to maximize cognitive skills in order to maximize functional independence at d/c, however patient refuses ST services at this time. Please re-consult if need appears.    Skilled Therapeutic Interventions          Patient evaluated using a standardized cognitive linguistic assessment and bedside swallow evaluation to assess current cognitive, communicative and swallowing function. See above for details.    SLP Assessment  Patient does not need any further Speech Lanaguage Pathology Services    Recommendations  SLP Diet Recommendations: Age appropriate regular solids;Thin Liquid Administration via: Cup;Straw Medication Administration: Whole meds with  liquid Supervision: Patient able to self  feed Compensations: Slow rate;Small sips/bites Postural Changes and/or Swallow Maneuvers: Seated upright 90 degrees Oral Care Recommendations: Oral care BID;Patient independent with oral care Patient destination: Home Follow up Recommendations: None Equipment Recommended: None recommended by SLP     Pain None reported   SLP Evaluation Cognition Overall Cognitive Status:  (likely at b/l level of cognitive functioning) Arousal/Alertness: Awake/alert Orientation Level: Oriented X4 Year: 2025 Month: June Day of Week: Correct Attention: Sustained Sustained Attention: Impaired (likely baseline personality) Memory: Impaired Memory Impairment: Decreased short term memory Decreased Short Term Memory: Verbal basic;Functional basic Awareness: Appears intact Problem Solving: Impaired Problem Solving Impairment: Verbal complex;Functional complex  Comprehension Auditory Comprehension Overall Auditory Comprehension: Appears within functional limits for tasks assessed Expression Expression Primary Mode of Expression: Verbal Verbal Expression Overall Verbal Expression: Appears within functional limits for tasks assessed Oral Motor Oral Motor/Sensory Function Overall Oral Motor/Sensory Function: Within functional limits Facial ROM: Within Functional Limits Facial Symmetry: Within Functional Limits Facial Strength: Within Functional Limits Lingual ROM: Within Functional Limits Lingual Symmetry: Within Functional Limits Lingual Strength: Within Functional Limits Mandible: Within Functional Limits Motor Speech Overall Motor Speech: Appears within functional limits for tasks assessed  Care Tool Care Tool Cognition Ability to hear (with hearing aid or hearing appliances if normally used Ability to hear (with hearing aid or hearing appliances if normally used): 0. Adequate - no difficulty in normal conservation, social interaction, listening to TV   Expression of Ideas and Wants  Expression of Ideas and Wants: 4. Without difficulty (complex and basic) - expresses complex messages without difficulty and with speech that is clear and easy to understand   Understanding Verbal and Non-Verbal Content Understanding Verbal and Non-Verbal Content: 4. Understands (complex and basic) - clear comprehension without cues or repetitions  Memory/Recall Ability Memory/Recall Ability : Current season;That he or she is in a hospital/hospital unit;Staff names and faces;Location of own room    Bedside Swallowing Assessment General Previous Swallow Assessment: 6/8 Diet Prior to this Study: Regular;Thin liquids (Level 0) Respiratory Status: Room air History of Recent Intubation: No Behavior/Cognition: Alert;Cooperative Oral Cavity - Dentition: Adequate natural dentition Self-Feeding Abilities: Able to feed self Vision: Functional for self-feeding Patient Positioning: Upright in bed Baseline Vocal Quality: Normal Volitional Cough: Strong Volitional Swallow: Able to elicit  Thin Liquid Thin Liquid: Within functional limits Presentation: Cup;Self Fed Nectar Thick Nectar Thick Liquid: Not tested Honey Thick Honey Thick Liquid: Not tested Puree Puree: Not tested Solid Solid: Within functional limits Presentation: Self Fed BSE Assessment Risk for Aspiration Impact on safety and function: No limitations  Recommendations for other services: None   Discharge Criteria: Patient will be discharged from SLP if patient refuses treatment 3 consecutive times without medical reason, if treatment goals not met, if there is a change in medical status, if patient makes no progress towards goals or if patient is discharged from hospital.  The above assessment, treatment plan, treatment alternatives and goals were discussed and mutually agreed upon: by patient  Ferrel Simington M.A., CCC-SLP 05/05/2024, 12:17 PM

## 2024-05-05 NOTE — Plan of Care (Signed)
  Problem: Consults Goal: RH STROKE PATIENT EDUCATION Description: See Patient Education module for education specifics  Outcome: Progressing   Problem: RH BOWEL ELIMINATION Goal: RH STG MANAGE BOWEL WITH ASSISTANCE Description: STG Manage Bowel with toileting Assistance. Outcome: Progressing Goal: RH STG MANAGE BOWEL W/MEDICATION W/ASSISTANCE Description: STG Manage Bowel with Medication with mod I  Assistance. Outcome: Progressing   Problem: RH SAFETY Goal: RH STG ADHERE TO SAFETY PRECAUTIONS W/ASSISTANCE/DEVICE Description: STG Adhere to Safety Precautions With cues Assistance/Device. Outcome: Progressing   Problem: RH PAIN MANAGEMENT Goal: RH STG PAIN MANAGED AT OR BELOW PT'S PAIN GOAL Description: < 4 with prns Outcome: Progressing   Problem: RH KNOWLEDGE DEFICIT Goal: RH STG INCREASE KNOWLEDGE OF DIABETES Description: Patient and son will be able to manage predibetes using educational resources for medications and dietary modification independently Outcome: Progressing Goal: RH STG INCREASE KNOWLEDGE OF HYPERTENSION Description: Patient and son will be able to manage HTN using educational resources for medications and dietary modification independently Outcome: Progressing Goal: RH STG INCREASE KNOWLEGDE OF HYPERLIPIDEMIA Description: Patient and son will be able to manage HLD using educational resources for medications and dietary modification independently Outcome: Progressing Goal: RH STG INCREASE KNOWLEDGE OF STROKE PROPHYLAXIS Description: Patient and son will be able to manage secondary risks using educational resources for medications and dietary modification independently Outcome: Progressing

## 2024-05-05 NOTE — Progress Notes (Signed)
 Pt educated on new blood sugar check times/days. PT requested since she was stuck today that she wanted to start the 5pm weekend checks on Sunday. Pt c/o that dinner was not filling, nurse provided gram crackers with a peanut butter packet at hs. Pt stated that during the day she felt dizzy and nauseous when up with therapy. Nurse did orthostatic BP next morning, see flow sheet. Pt stated that she was a bit anxious but does not want anxiety medication at this time.

## 2024-05-06 DIAGNOSIS — I10 Essential (primary) hypertension: Secondary | ICD-10-CM | POA: Diagnosis not present

## 2024-05-06 DIAGNOSIS — I6381 Other cerebral infarction due to occlusion or stenosis of small artery: Secondary | ICD-10-CM | POA: Diagnosis not present

## 2024-05-06 LAB — COMPREHENSIVE METABOLIC PANEL WITH GFR
ALT: 12 U/L (ref 0–44)
AST: 22 U/L (ref 15–41)
Albumin: 3.4 g/dL — ABNORMAL LOW (ref 3.5–5.0)
Alkaline Phosphatase: 107 U/L (ref 38–126)
Anion gap: 10 (ref 5–15)
BUN: 18 mg/dL (ref 8–23)
CO2: 24 mmol/L (ref 22–32)
Calcium: 9 mg/dL (ref 8.9–10.3)
Chloride: 102 mmol/L (ref 98–111)
Creatinine, Ser: 1.05 mg/dL — ABNORMAL HIGH (ref 0.44–1.00)
GFR, Estimated: 54 mL/min — ABNORMAL LOW (ref >60)
Glucose, Bld: 100 mg/dL — ABNORMAL HIGH (ref 70–99)
Potassium: 4.4 mmol/L (ref 3.5–5.1)
Sodium: 136 mmol/L (ref 135–145)
Total Bilirubin: 0.3 mg/dL (ref 0.0–1.2)
Total Protein: 6.9 g/dL (ref 6.5–8.1)

## 2024-05-06 LAB — CBC WITH DIFFERENTIAL/PLATELET
Abs Immature Granulocytes: 0.01 10*3/uL (ref 0.00–0.07)
Basophils Absolute: 0.1 10*3/uL (ref 0.0–0.1)
Basophils Relative: 1 %
Eosinophils Absolute: 0.2 10*3/uL (ref 0.0–0.5)
Eosinophils Relative: 4 %
HCT: 40 % (ref 36.0–46.0)
Hemoglobin: 13.8 g/dL (ref 12.0–15.0)
Immature Granulocytes: 0 %
Lymphocytes Relative: 43 %
Lymphs Abs: 2.3 10*3/uL (ref 0.7–4.0)
MCH: 31.9 pg (ref 26.0–34.0)
MCHC: 34.5 g/dL (ref 30.0–36.0)
MCV: 92.4 fL (ref 80.0–100.0)
Monocytes Absolute: 0.5 10*3/uL (ref 0.1–1.0)
Monocytes Relative: 9 %
Neutro Abs: 2.4 10*3/uL (ref 1.7–7.7)
Neutrophils Relative %: 43 %
Platelets: 360 10*3/uL (ref 150–400)
RBC: 4.33 MIL/uL (ref 3.87–5.11)
RDW: 12.3 % (ref 11.5–15.5)
WBC: 5.4 10*3/uL (ref 4.0–10.5)
nRBC: 0 % (ref 0.0–0.2)

## 2024-05-06 NOTE — Progress Notes (Signed)
 Occupational Therapy Session Note  Patient Details  Name: Anita Reilly MRN: [id number] Date of Birth: [birth year]  {CHL IP REHAB OT TIME CALCULATIONS:[id number]}   Short Term Goals: Week 1:  OT Short Term Goal 1 (Week 1): Pt will complete tub transfer with TTB with supervision OT Short Term Goal 2 (Week 1): Pt will complete toilet transfer with supervision  Skilled Therapeutic Interventions/Progress Updates:  Pt greeted *** for skilled OT session with focus on ***.   Pain: Pt reported ***/10 pain, stating *** in reference to ***. OT offering intermediate rest breaks and positioning suggestions throughout session to address pain/fatigue and maximize participation/safety in session.   Functional Transfers:  Self Care Tasks: Pt completes the following self care tasks with levels of assistance noted below, UB: LB:   Therapeutic Activities:  Therapeutic Exercise:   Education:  Pt remained *** with 4Ps assessed and immediate needs met. Pt continues to be appropriate for skilled OT intervention to promote further functional independence in ADLs/IADLs.    Therapy Documentation Precautions:  Precautions Precautions: Fall Precaution/Restrictions Comments: L hemi Restrictions Weight Bearing Restrictions Per Provider Order: No   Therapy/Group: Individual Therapy  Artemus Biles, OTR/L, MSOT  05/06/2024, 9:00 PM

## 2024-05-06 NOTE — Progress Notes (Signed)
 Patient ID: Anita Reilly, female   DOB: [birth year], 78 y.o.   MRN: [id number] Met with the patient to review current medical situation, rehab process, team conference and plan of care. Discussed secondary risk management including HTN, HLD (LDL 179/Trig 125) on Lipitor  and DM (A1 C 6.2 on metformin BID. Reviewed HH diet modifications and DAPT x 3 wks then Plavix  solo per MD.  Assisted with MyChart account access. Continue to follow along to address educational needs to facilitate preparation for discharge. Anita Reilly

## 2024-05-06 NOTE — IPOC Note (Signed)
 Overall Plan of Care [facility]) Patient Details Name: Anita Reilly MRN: [id number] DOB: 1946/08/09  Admitting Diagnosis: Lacunar infarction [facility])  Hospital Problems: Principal Problem:   Lacunar infarction [facility])     Functional Problem List: Nursing Safety, Endurance, Medication Management, Pain, Bowel  PT Balance, Edema, Endurance, Motor, Skin Integrity, Nutrition  OT Balance, Endurance, Motor  SLP Cognition  TR         Basic ADL's: OT Bathing, Dressing, Toileting     Advanced  ADL's: OT Simple Meal Preparation     Transfers: PT Bed Mobility, Bed to Chair, Car, Lobbyist, Technical brewer: PT Ambulation, Psychologist, prison and probation services, Stairs     Additional Impairments: OT None  SLP Social Cognition   Memory, Attention, Problem Solving  TR      Anticipated Outcomes Item Anticipated Outcome  Self Feeding Independent  Swallowing      Basic self-care  Mod I  Toileting  Mod I   Bathroom Transfers Mod I  Bowel/Bladder  manage bowel w mod I assist  Transfers  Mod I with LRAD  Locomotion  Mod I with LRAD  Communication     Cognition     Pain  Pain < 4 with prns  Safety/Judgment  manage w cues   Therapy Plan: PT Intensity: Minimum of 1-2 x/day ,45 to 90 minutes PT Frequency: 5 out of 7 days PT Duration Estimated Length of Stay: 7-10 days OT Intensity: Minimum of 1-2 x/day, 45 to 90 minutes OT Frequency: 5 out of 7 days OT Duration/Estimated Length of Stay: 7-10 days SLP Duration/Estimated Length of Stay: n/a   Team Interventions: Nursing Interventions Medication Management, Bowel Management, Discharge Planning, Disease Management/Prevention, Pain Management, Patient/Family Education  PT interventions Ambulation/gait training, Discharge planning, Functional mobility training, Psychosocial support, Therapeutic Activities, Visual/perceptual remediation/compensation, Balance/vestibular training, Disease management/prevention, Neuromuscular  re-education, Skin care/wound management, Therapeutic Exercise, Wheelchair propulsion/positioning, DME/adaptive equipment instruction, Cognitive remediation/compensation, Pain management, Splinting/orthotics, UE/LE Strength taining/ROM, Community reintegration, Development worker, international aid stimulation, Patient/family education, Museum/gallery curator, UE/LE Coordination activities  OT Interventions Warden/ranger, Discharge planning, Pain management, Self Care/advanced ADL retraining, Therapeutic Activities, UE/LE Coordination activities, Disease mangement/prevention, Functional mobility training, Patient/family education, Therapeutic Exercise, Community reintegration, Fish farm manager, Neuromuscular re-education, UE/LE Strength taining/ROM  SLP Interventions    TR Interventions    SW/CM Interventions Discharge Planning, Psychosocial Support, Patient/Family Education   Barriers to Discharge MD  Medical stability  Nursing Decreased caregiver support, Home environment access/layout 2 level 6+ landing+ 6 or threshold p ramped entry, main B+B available; son to assist  PT Home environment access/layout, Other (comments) L hemi with ataxia, steps inside home, lives alone  OT Home environment access/layout    SLP      SW Insurance for SNF coverage     Team Discharge Planning: Destination: PT-Home ,OT- Home , SLP-Home Projected Follow-up: PT-Outpatient PT, OT-  Outpatient OT, SLP-None Projected Equipment Needs: PT-To be determined, OT- Tub/shower bench, SLP-None recommended by SLP Equipment Details: PT-has RW, OT-  Patient/family involved in discharge planning: PT- Patient, Family member/caregiver,  OT-Patient, SLP-Patient  MD ELOS: 7d Medical Rehab Prognosis:  Excellent Assessment: The patient has been admitted for CIR therapies with the diagnosis of lacunar infarction. The team will be addressing functional mobility, strength, stamina, balance, safety, adaptive techniques and  equipment, self-care, bowel and bladder mgt, patient and caregiver education, BP and bowel management . Goals have been set at Mod I. Anticipated discharge destination is home .  See Team Conference Notes for weekly updates to the plan of care

## 2024-05-06 NOTE — Progress Notes (Signed)
 Physical Therapy Session Note  Patient Details  Name: Anita Reilly MRN: [id number] Date of Birth: 04-18-1946  Today's Date: 05/06/2024 PT Individual Time: 1330-1441 PT Individual Time Calculation (min): 71 min   Short Term Goals: Week 1:  PT Short Term Goal 1 (Week 1): STG=LTG due to LOS  Skilled Therapeutic Interventions/Progress Updates:     Pt lying in bed to start - denies pain and she's aware of upcoming therapy session. Pt requests to use bathroom before leaving her room. Bed mobility completed at supervision level. Donned socks and shoes with setupA as she sat EOB - completed via figure-4 technique. Sit<>stand with CGA and no AD - narrow BOS and mild unsteadiness with initial standing. Ambulated into the bathroom with CGA and patient continent of bladder void. Ambulates with CGA from bedroom to main gym, ~183ft, with CGA and no AD - mild unsteadiness with narrow BOS continues.    Therapeutic Activity: Bed Mobility: Pt performed supine<>sit on EOB with supervision and performed lower body dressing with supervision.  Transfers: Pt performed sit<>stand and stand to sit  transfers throughout session with CGA to supervision. Provided VC for turning with control before sitting -Nustep -10 min - 80SPM - .4 miles - 2.  Gait Training:  Pt ambulated 173ft without AD with CGA. Pt demonstrated the following gait deviations with therapist providing the described cuing and facilitation for improvement: BL arm swing    Neuromuscular Re-ed:  NMR performed for improvements in motor control and coordination, balance, sequencing, judgement, and self confidence/ efficacy in performing all aspects of mobility at highest level of independence.   Exercises   -Side stepping and catching with increasing steps outside of BOS with CGA to minA -Backward walking with head turns  -Static manual perturbations with reactive righting  -Multi-directional trust falls - Pt struggled with confidence  performing Left lateral trust fall, erroneously using R foot stepping strategy toward the left -Reverse pivot steps   -side stepping and kicking soccer ball with CGA to minA to prevent falls  Patient left in bed at end of session with all 3 green lights on, call bell and all needs within reach.   R grip strength: 52lb L grip strength: 46lb *age matched norm for grip strength = 52lb   Therapy Documentation Precautions:  Precautions Precautions: Fall Precaution/Restrictions Comments: L hemi Restrictions Weight Bearing Restrictions Per Provider Order: No     Therapy/Group: Individual Therapy  Pheobe Brass 05/06/2024, 8:54 AM

## 2024-05-06 NOTE — Plan of Care (Signed)
  Problem: RH BOWEL ELIMINATION Goal: RH STG MANAGE BOWEL WITH ASSISTANCE Description: STG Manage Bowel with toileting Assistance. Outcome: Progressing Goal: RH STG MANAGE BOWEL W/MEDICATION W/ASSISTANCE Description: STG Manage Bowel with Medication with mod I  Assistance. Outcome: Progressing   Problem: RH SAFETY Goal: RH STG ADHERE TO SAFETY PRECAUTIONS W/ASSISTANCE/DEVICE Description: STG Adhere to Safety Precautions With cues Assistance/Device. Outcome: Progressing   Problem: RH PAIN MANAGEMENT Goal: RH STG PAIN MANAGED AT OR BELOW PT'S PAIN GOAL Description: < 4 with prns Outcome: Progressing   Problem: RH KNOWLEDGE DEFICIT Goal: RH STG INCREASE KNOWLEDGE OF DIABETES Description: Patient and son will be able to manage predibetes using educational resources for medications and dietary modification independently Outcome: Progressing Goal: RH STG INCREASE KNOWLEDGE OF HYPERTENSION Description: Patient and son will be able to manage HTN using educational resources for medications and dietary modification independently Outcome: Progressing Goal: RH STG INCREASE KNOWLEGDE OF HYPERLIPIDEMIA Description: Patient and son will be able to manage HLD using educational resources for medications and dietary modification independently Outcome: Progressing Goal: RH STG INCREASE KNOWLEDGE OF STROKE PROPHYLAXIS Description: Patient and son will be able to manage secondary risks using educational resources for medications and dietary modification independently Outcome: Progressing

## 2024-05-06 NOTE — Progress Notes (Signed)
 Inpatient Rehabilitation  Patient information reviewed and entered into eRehab system by [facility]. Karen Kays., CCC/SLP, PPS Coordinator.  Information including medical coding, functional ability and quality indicators will be reviewed and updated through discharge.

## 2024-05-06 NOTE — Progress Notes (Signed)
 Occupational Therapy Session Note  Patient Details  Name: Anita Reilly MRN: [id number] Date of Birth: [birth year]  Today's Date: 05/06/2024 OT Individual Time: 4098-1191 OT Individual Time Calculation (min): 57 min    Short Term Goals: Week 1:  OT Short Term Goal 1 (Week 1): Pt will complete tub transfer with TTB with supervision OT Short Term Goal 2 (Week 1): Pt will complete toilet transfer with supervision  Skilled Therapeutic Interventions/Progress Updates:    Pt received supine with no c/o pain, agreeable to OT session, very pleasant and motivated for therapy. She reported intermittent nausea with BM's more frequent than her normal. She came to EOB with (S). She completed functional mobility with no device around the room to gather clothes for the day and then into the bathroom with CGA. She required min cueing for not furniture walking and upright trunk. Toileting tasks completed with CGA. She completed UB bathing at the sink with close (S) in standing. Min cueing for sit <> stand to complete LB bathing. Pt with great safety awareness during b/d. She donned underwear and pants with CGA. She completed 100 ft of functional mobility to the therapy gym with CGA. She worked on Dealer by completing an obstacle course that challenged her stepping onto/off several dynamic surfaces. She required min cueing for technique and CGA overall. She ended with gross motor coordination task and trunk stabilization activity, modified wood chops with a 3 lb medicine ball held bimanually. She required demonstrated and was able to return demo with min cueing for technique. She returned to her room following and was left sitting up in the w/c with all needs met, chair alarm set.   Therapy Documentation Precautions:  Precautions Precautions: Fall Precaution/Restrictions Comments: L hemi Restrictions Weight Bearing Restrictions Per Provider Order: No Therapy/Group:  Individual Therapy  Anita Reilly 05/06/2024, 6:31 AM

## 2024-05-06 NOTE — Progress Notes (Signed)
 [facility]patient Rehabilitation Center Individual Statement of Services  Patient Name:  Anita Reilly  Date:  05/06/2024  Welcome to the [facility]patient [facility].  Our goal is to provide you with an individualized program based on your diagnosis and situation, designed to meet your specific needs.  With this comprehensive rehabilitation program, you will be expected to participate in at least 3 hours of rehabilitation therapies Monday-Friday, with modified therapy programming on the weekends.  Your rehabilitation program will include the following services:  Physical Therapy (PT), Occupational Therapy (OT), Speech Therapy (ST), 24 hour per day rehabilitation nursing, Therapeutic Recreaction (TR), Care Coordinator, Rehabilitation Medicine, Nutrition Services, and Pharmacy Services  Weekly team conferences will be held on Wednesday to discuss your progress.  Your Inpatient Rehabilitation Care Coordinator will talk with you frequently to get your input and to update you on team discussions.  Team conferences with you and your family in attendance may also be held.  Expected length of stay: 7-10 days  Overall anticipated outcome: independent with device  Depending on your progress and recovery, your program may change. Your Inpatient Rehabilitation Care Coordinator will coordinate services and will keep you informed of any changes. Your Inpatient Rehabilitation Care Coordinator's name and contact numbers are listed  below.  The following services may also be recommended but are not provided by the Inpatient Rehabilitation Center:  Driving Evaluations Home Health Rehabiltiation Services Outpatient Rehabilitation Services    Arrangements will be made to provide these services after discharge if needed.  Arrangements include referral to agencies that provide these services.  Your insurance has been verified to be:  [facility] Medicare Your primary doctor is:  Overton Blotter  Pertinent  information will be shared with your doctor and your insurance company.  Inpatient Rehabilitation Care Coordinator:  Anita Reilly, Anita Reilly [phone] or Anita Reilly  Information discussed with and copy given to patient by: Anita Reilly, 05/06/2024, 9:16 AM

## 2024-05-06 NOTE — Progress Notes (Signed)
 Occupational Therapy Session Note  Patient Details  Name: Anita Reilly MRN: [id number] Date of Birth: May 30, 1946  Today's Date: 05/06/2024 OT Individual Time: 9563-8756 OT Individual Time Calculation (min): 56 min    Short Term Goals: Week 1:  OT Short Term Goal 1 (Week 1): Pt will complete tub transfer with TTB with supervision OT Short Term Goal 2 (Week 1): Pt will complete toilet transfer with supervision  Skilled Therapeutic Interventions/Progress Updates:     Pt received sitting up in wc, dressed and ready for the day prior to OT arrival. Pt presenting to be in good spirits receptive to skilled OT session reporting 0/10 pain- OT offering intermittent rest breaks, repositioning, and therapeutic support to optimize participation in therapy session. She did report that she was having some nausea symptoms with increased BM this AM, requesting to stay in room for session to be close to a bathroom. Focused this session on Pt education to support learning for d/c. Pt with many questions regarding her CVA, impact of CVA on her functional status, and how to prevent additional CVAs. Directed Pt to educational handbook with hand outs provided. Education provided on CVA etiology/recovery process, risk factors for CVA, and simple lifestyle changes to implement to decrease risk for CVA with Pt presenting to be motivated to learn and implement changes. Also educated on BE-FAST acronym to support learning of CVA signs/symptoms with pt demonstrating teach back as evidence of learning. Provided education on fall prevention, energy conservation, and simple home modifications to increase Pt's safety at d/c. Pt participatory throughout education and able to apply taught techniques to her home environment. Pt receptive to all education provided. Pt was left resting in wc with call bell in reach, chair alarm on, and all needs met.    Therapy Documentation Precautions:  Precautions Precautions:  Fall Precaution/Restrictions Comments: L hemi Restrictions Weight Bearing Restrictions Per Provider Order: No   Therapy/Group: Individual Therapy  Geoffery Kiel 05/06/2024, 8:01 AM

## 2024-05-06 NOTE — Progress Notes (Signed)
 Inpatient Rehabilitation Care Coordinator Assessment and Plan Patient Details  Name: Anita Reilly MRN: [id number] Date of Birth: 1946/11/02  Today's Date: 05/06/2024  Hospital Problems: Principal Problem:   Lacunar infarction [facility])  Past Medical History:  Past Medical History:  Diagnosis Date   Arthritis    shoulders   Bell's palsy 2016   Vertigo    none in several years   Past Surgical History:  Past Surgical History:  Procedure Laterality Date   BREAST BIOPSY Left 05/06/2021   u/s bx, hydromark, path pending   CARPAL TUNNEL RELEASE     left    CATARACT EXTRACTION W/PHACO Right 09/19/2018   Procedure: CATARACT EXTRACTION PHACO AND INTRAOCULAR LENS PLACEMENT (IOC) RIGHT;  Surgeon: Annell Kidney, MD;  Location: [facility];  Service: Ophthalmology;  Laterality: Right;  latex sensitivity   CATARACT EXTRACTION W/PHACO Left 10/10/2018   Procedure: CATARACT EXTRACTION PHACO AND INTRAOCULAR LENS PLACEMENT (IOC)  LEFT;  Surgeon: Annell Kidney, MD;  Location: [facility];  Service: Ophthalmology;  Laterality: Left;  Latex sensitivity requests arrival around 10 or 10:30   COLONOSCOPY     Social History:  reports that she has never smoked. She has never used smokeless tobacco. She reports that she does not drink alcohol and does not use drugs.  Family / Support Systems Marital Status: Widow/Widower How Long?: 3 years Patient Roles: Parent, Other (Comment) (retiree) Children: Jeanell Mikes [phone]  Tina-daughter [phone]  Stephanie-daughter [phone] Other Supports: Daughter in-law Anticipated Caregiver: Between family members hopefully can provide the care pt will need at discharge. Working on a plan Ability/Limitations of Caregiver: All work but will work on a Statistician: Other (Comment) (Working on a plan for DC) Family Dynamics: Close with children all are local and visit often. Pt is very independent and hopeful to get back to  this level.  Social History Preferred language: English Religion: Non-Denominational Cultural Background: NA Education: HS Health Literacy - How often do you need to have someone help you when you read instructions, pamphlets, or other written material from your doctor or pharmacy?: Never Writes: Yes Employment Status: Retired Marine scientist Issues: NA Guardian/Conservator: None-according to MD pt is capable of making her own decisons while here   Abuse/Neglect Abuse/Neglect Assessment Can Be Completed: Yes Physical Abuse: Denies Verbal Abuse: Denies Sexual Abuse: Denies Exploitation of patient/patient's resources: Denies Self-Neglect: Denies  Patient response to: Social Isolation - How often do you feel lonely or isolated from those around you?: Never  Emotional Status Pt's affect, behavior and adjustment status: Pt is motivated to get back to her independent level. Her children are local and will support her. She has been widowed for three years and has managed on her own. Recent Psychosocial Issues: other health issues Psychiatric History: No history/issues seems to be coping appropraitely and positive regarding her progress and being here on rehab Substance Abuse History: NA  Patient / Family Perceptions, Expectations & Goals Pt/Family understanding of illness & functional limitations: Pt is able to explain her stroke and deficits, she has made progress which is encouraging to her. She does talk with the MD rounding and feels has a good understanding of her plan moving forward. Premorbid pt/family roles/activities: mom, grandmother, retiree, widow, church member, etc Anticipated changes in roles/activities/participation: resume Pt/family expectations/goals: Pt states:  I hope to do well and not need someone right there with me.  Community [employer] Agencies: None Premorbid Home Care/DME Agencies: None Transportation available at discharge: self and  family Is  the patient able to respond to transportation needs?: Yes In the past 12 months, has lack of transportation kept you from medical appointments or from getting medications?: No In the past 12 months, has lack of transportation kept you from meetings, work, or from getting things needed for daily living?: No  Discharge Planning Living Arrangements: Alone Support Systems: Children, Other relatives, Friends/neighbors, Psychologist, clinical community Type of Residence: Private residence Insurance Resources: Media planner (specify) ([employer] Medicare) Financial Resources: Social Security Financial Screen Referred: No Living Expenses: Own Money Management: Patient Does the patient have any problems obtaining your medications?: No Home Management: self Patient/Family Preliminary Plans: Return home with family assisting if needed. Family will need to come up with a plan for home if 24/7 care is needed. She is aware of team conference on Wednesday will update then goals and target discharge date Care Coordinator Barriers to Discharge: Insurance for SNF coverage Care Coordinator Anticipated Follow Up Needs: HH/OP  Clinical Impression Pleasant female who is motivated to do well and recover from this stroke. Her children are local and involved and will come up with a plan for discharge. Update on Wed after team conference.  Mardell Shade 05/06/2024, 9:15 AM

## 2024-05-06 NOTE — Progress Notes (Addendum)
 PROGRESS NOTE   Subjective/Complaints:  No issues overnite , pt feels like LUE almost back to normal , using LUE for eating   ROS: Patient denies CP, SOB, N/V/D Objective:   No results found. Recent Labs    05/06/24 0505  WBC 5.4  HGB 13.8  HCT 40.0  PLT 360   Recent Labs    05/06/24 0505  NA 136  K 4.4  CL 102  CO2 24  GLUCOSE 100*  BUN 18  CREATININE 1.05*  CALCIUM  9.0    Intake/Output Summary (Last 24 hours) at 05/06/2024 1224 Last data filed at 05/06/2024 0746 Gross per 24 hour  Intake 712 ml  Output --  Net 712 ml        Physical Exam: Vital Signs Blood pressure 123/77, pulse 83, temperature 97.6 F (36.4 C), temperature source Oral, resp. rate 18, SpO2 99%.   General: No acute distress Mood and affect are appropriate Heart: Regular rate and rhythm no rubs murmurs or extra sounds Lungs: Clear to auscultation, breathing unlabored, no rales or wheezes Abdomen: Positive bowel sounds, soft nontender to palpation, nondistended Extremities: No clubbing, cyanosis, or edema Skin: No evidence of breakdown, no evidence of rash  Neuro:  Alert and oriented x 3. Normal insight and awareness. Intact Memory. Normal language. Sl dysarthric speech. Cranial nerve exam unremarkable except central VII on left. MMT: RUE5/5. LUE 4+/5 deltoid, 4+ bicep,tricep, wrist, hand.  RLE  5/5. LLE 4/5 HF, KE and 4/5 ADF/PF. Sensory exam normal for light touch and pain in all 4 limbs. No limb ataxia or cerebellar signs. No abnormal tone appreciated.   Musculoskeletal: Full ROM, No pain with AROM or PROM in the neck, trunk, or extremities. Posture appropriate     Assessment/Plan: 1. Functional deficits which require 3+ hours per day of interdisciplinary therapy in a comprehensive inpatient rehab setting. Physiatrist is providing close team supervision and 24 hour management of active medical problems listed below. Physiatrist and  rehab team continue to assess barriers to discharge/monitor patient progress toward functional and medical goals  Care Tool:  Bathing    Body parts bathed by patient: Right arm, Left arm, Chest, Abdomen, Front perineal area, Buttocks, Right upper leg, Left upper leg, Right lower leg, Left lower leg, Face         Bathing assist Assist Level: Contact Guard/Touching assist     Upper Body Dressing/Undressing Upper body dressing   What is the patient wearing?: Pull over shirt    Upper body assist Assist Level: Set up assist    Lower Body Dressing/Undressing Lower body dressing      What is the patient wearing?: Pants, Underwear/pull up     Lower body assist Assist for lower body dressing: Contact Guard/Touching assist     Toileting Toileting    Toileting assist Assist for toileting: Contact Guard/Touching assist     Transfers Chair/bed transfer  Transfers assist     Chair/bed transfer assist level: Minimal Assistance - Patient > 75%     Locomotion Ambulation   Ambulation assist      Assist level: Moderate Assistance - Patient 50 - 74% Assistive device: No Device Max distance: 79ft  Walk 10 feet activity   Assist     Assist level: Moderate Assistance - Patient - 50 - 74% Assistive device: No Device   Walk 50 feet activity   Assist Walk 50 feet with 2 turns activity did not occur: Safety/medical concerns (fatigue, weakness)         Walk 150 feet activity   Assist Walk 150 feet activity did not occur: Safety/medical concerns (fatigue, weakness)         Walk 10 feet on uneven surface  activity   Assist     Assist level: Minimal Assistance - Patient > 75% Assistive device: Other (comment) (RUE support)   Wheelchair     Assist Is the patient using a wheelchair?: Yes Type of Wheelchair: Manual    Wheelchair assist level: Dependent - Patient 0% Max wheelchair distance: 142ft    Wheelchair 50 feet with 2 turns  activity    Assist        Assist Level: Dependent - Patient 0%   Wheelchair 150 feet activity     Assist      Assist Level: Dependent - Patient 0%   Blood pressure 123/77, pulse 83, temperature 97.6 F (36.4 C), temperature source Oral, resp. rate 18, SpO2 99%.  Medical Problem List and Plan: 1. 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  Status post TNK             -patient may shower             -ELOS/Goals: 7+ days, mod I to supervision goals  -Continue CIR therapies including PT, OT, and SLP  2.  Antithrombotics: -DVT/anticoagulation:  Mechanical: Antiembolism stockings, thigh (TED hose) Bilateral lower extremities             -antiplatelet therapy: Aspirin  81 mg daily and Plavix  75 mg daily x 3 weeks then Plavix  alone 3. Pain Management: Lyrica  25 mg twice daily             -pain controlled 4. Mood/Behavior/Sleep: Trazodone  50 mg nightly as needed.  Provide emotional support             -antipsychotic agents: N/A 5. Neuropsych/cognition: This patient is capable of making decisions on her own behalf. 6. Skin/Wound Care: Routine skin checks 7. Fluids/Electrolytes/Nutrition: Routine in and outs with follow-up chemistries 8.  Hypertension.  Cozaar  50 mg daily.  Monitor with increased mobility 9.  Hyperlipidemia.  Lipitor  10.  History of right face Bell's palsy.  Maintained on Lyrica  25 mg twice daily follow-up as outpatient 11.  GERD.  Protonix  12.  ?Borderline DM, Hemoglobin A1c 6.2.   Consider adding metformin.             -changed cbg's to daily, staggered, QAM and QSupper every other day  -will change diet to regular so that she can have vegetables. Pt will avoid sugars, carbs.             -no SSI 13.  Constipation.  MiraLAX  daily, Senokot S,1 tablet at bedtime as needed   -LBM 6/14  LOS: 3 days A FACE TO FACE EVALUATION WAS PERFORMED  Genetta Kenning 05/06/2024, 12:24 PM

## 2024-05-07 DIAGNOSIS — I6381 Other cerebral infarction due to occlusion or stenosis of small artery: Secondary | ICD-10-CM | POA: Diagnosis not present

## 2024-05-07 DIAGNOSIS — R739 Hyperglycemia, unspecified: Secondary | ICD-10-CM | POA: Diagnosis not present

## 2024-05-07 DIAGNOSIS — I10 Essential (primary) hypertension: Secondary | ICD-10-CM | POA: Diagnosis not present

## 2024-05-07 DIAGNOSIS — K5901 Slow transit constipation: Secondary | ICD-10-CM | POA: Diagnosis not present

## 2024-05-07 NOTE — Plan of Care (Signed)
  Problem: Consults Goal: RH STROKE PATIENT EDUCATION Description: See Patient Education module for education specifics  Outcome: Progressing   Problem: RH BOWEL ELIMINATION Goal: RH STG MANAGE BOWEL WITH ASSISTANCE Description: STG Manage Bowel with toileting Assistance. Outcome: Progressing Goal: RH STG MANAGE BOWEL W/MEDICATION W/ASSISTANCE Description: STG Manage Bowel with Medication with mod I  Assistance. Outcome: Progressing   Problem: RH SAFETY Goal: RH STG ADHERE TO SAFETY PRECAUTIONS W/ASSISTANCE/DEVICE Description: STG Adhere to Safety Precautions With cues Assistance/Device. Outcome: Progressing   Problem: RH PAIN MANAGEMENT Goal: RH STG PAIN MANAGED AT OR BELOW PT'S PAIN GOAL Description: < 4 with prns Outcome: Progressing   Problem: RH KNOWLEDGE DEFICIT Goal: RH STG INCREASE KNOWLEDGE OF DIABETES Description: Patient and son will be able to manage predibetes using educational resources for medications and dietary modification independently Outcome: Progressing Goal: RH STG INCREASE KNOWLEDGE OF HYPERTENSION Description: Patient and son will be able to manage HTN using educational resources for medications and dietary modification independently Outcome: Progressing Goal: RH STG INCREASE KNOWLEGDE OF HYPERLIPIDEMIA Description: Patient and son will be able to manage HLD using educational resources for medications and dietary modification independently Outcome: Progressing Goal: RH STG INCREASE KNOWLEDGE OF STROKE PROPHYLAXIS Description: Patient and son will be able to manage secondary risks using educational resources for medications and dietary modification independently Outcome: Progressing

## 2024-05-07 NOTE — Progress Notes (Signed)
 PROGRESS NOTE   Subjective/Complaints:  Pt up in chair. Feeling well and much improved from a strength standpoint.   ROS: Patient denies fever, rash, sore throat, blurred vision, dizziness, nausea, vomiting, diarrhea, cough, shortness of breath or chest pain, joint or back/neck pain, headache, or mood change.    Objective:   No results found. Recent Labs    05/06/24 0505  WBC 5.4  HGB 13.8  HCT 40.0  PLT 360   Recent Labs    05/06/24 0505  NA 136  K 4.4  CL 102  CO2 24  GLUCOSE 100*  BUN 18  CREATININE 1.05*  CALCIUM  9.0    Intake/Output Summary (Last 24 hours) at 05/07/2024 0947 Last data filed at 05/07/2024 0731 Gross per 24 hour  Intake 836 ml  Output --  Net 836 ml        Physical Exam: Vital Signs Blood pressure (!) 145/80, pulse 77, temperature 97.7 F (36.5 C), temperature source Oral, resp. rate 16, SpO2 97%.   Constitutional: No distress . Vital signs reviewed. HEENT: NCAT, EOMI, oral membranes moist Neck: supple Cardiovascular: RRR without murmur. No JVD    Respiratory/Chest: CTA Bilaterally without wheezes or rales. Normal effort    GI/Abdomen: BS +, non-tender, non-distended Ext: no clubbing, cyanosis, or edema Psych: pleasant and cooperative  Neuro:  Alert and oriented x 3. Normal insight and awareness. Intact Memory. Normal language. Sl dysarthric speech. Cranial nerve exam unremarkable except central VII on left. MMT: RUE5/5. LUE 4+/5 deltoid, 4+ bicep,tricep, wrist, hand.  RLE  5/5. LLE 4/ to 4+/5 HF, KE and 4/5 ADF/PF. Sensory exam normal for light touch and pain in all 4 limbs. No limb ataxia or cerebellar signs. No abnormal tone appreciated.   Musculoskeletal: Full ROM, No pain with AROM or PROM in the neck, trunk, or extremities. Posture appropriate     Assessment/Plan: 1. Functional deficits which require 3+ hours per day of interdisciplinary therapy in a comprehensive inpatient  rehab setting. Physiatrist is providing close team supervision and 24 hour management of active medical problems listed below. Physiatrist and rehab team continue to assess barriers to discharge/monitor patient progress toward functional and medical goals  Care Tool:  Bathing    Body parts bathed by patient: Right arm, Left arm, Chest, Abdomen, Front perineal area, Buttocks, Right upper leg, Left upper leg, Right lower leg, Left lower leg, Face         Bathing assist Assist Level: Contact Guard/Touching assist     Upper Body Dressing/Undressing Upper body dressing   What is the patient wearing?: Pull over shirt    Upper body assist Assist Level: Set up assist    Lower Body Dressing/Undressing Lower body dressing      What is the patient wearing?: Pants, Underwear/pull up     Lower body assist Assist for lower body dressing: Contact Guard/Touching assist     Toileting Toileting    Toileting assist Assist for toileting: Contact Guard/Touching assist     Transfers Chair/bed transfer  Transfers assist     Chair/bed transfer assist level: Minimal Assistance - Patient > 75%     Locomotion Ambulation   Ambulation assist  Assist level: Moderate Assistance - Patient 50 - 74% Assistive device: No Device Max distance: 33ft   Walk 10 feet activity   Assist     Assist level: Moderate Assistance - Patient - 50 - 74% Assistive device: No Device   Walk 50 feet activity   Assist Walk 50 feet with 2 turns activity did not occur: Safety/medical concerns (fatigue, weakness)         Walk 150 feet activity   Assist Walk 150 feet activity did not occur: Safety/medical concerns (fatigue, weakness)         Walk 10 feet on uneven surface  activity   Assist     Assist level: Minimal Assistance - Patient > 75% Assistive device: Other (comment) (RUE support)   Wheelchair     Assist Is the patient using a wheelchair?: Yes Type of Wheelchair:  Manual    Wheelchair assist level: Dependent - Patient 0% Max wheelchair distance: 146ft    Wheelchair 50 feet with 2 turns activity    Assist        Assist Level: Dependent - Patient 0%   Wheelchair 150 feet activity     Assist      Assist Level: Dependent - Patient 0%   Blood pressure (!) 145/80, pulse 77, temperature 97.7 F (36.5 C), temperature source Oral, resp. rate 16, SpO2 97%.  Medical Problem List and Plan: 1. 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  Status post TNK             -patient may shower             -ELOS/Goals: 7+ days, mod I to supervision goals  -Continue CIR therapies including PT, OT, and SLP   2.  Antithrombotics: -DVT/anticoagulation:  Mechanical: Antiembolism stockings, thigh (TED hose) Bilateral lower extremities             -antiplatelet therapy: Aspirin  81 mg daily and Plavix  75 mg daily x 3 weeks then Plavix  alone 3. Pain Management: Lyrica  25 mg twice daily             -pain controlled 4. Mood/Behavior/Sleep: Trazodone  50 mg nightly as needed.  Provide emotional support             -antipsychotic agents: N/A 5. Neuropsych/cognition: This patient is capable of making decisions on her own behalf. 6. Skin/Wound Care: Routine skin checks 7. Fluids/Electrolytes/Nutrition: pt with consistent intake  -recent labs wnl 8.  Hypertension.  Cozaar  50 mg daily.  Monitor with increased mobility 9.  Hyperlipidemia.  Lipitor  10.  History of right face Bell's palsy.  Maintained on Lyrica  25 mg twice daily follow-up as outpatient 11.  GERD.  Protonix  12.  ?Borderline DM, Hemoglobin A1c 6.2.   Consider adding metformin.             -changed cbg's to daily, staggered, QAM and QSupper every other day  -on regular diet so that she can have vegetables. Pt will avoid sugars, carbs.             -no SSI  -reasonable control 13.  Constipation.  MiraLAX  daily, Senokot S,1 tablet at bedtime as needed   -LBM  6/16--likes miralax . Asked if she needs to be on thiswhen she goes home.   LOS: 4 days A FACE TO FACE EVALUATION WAS PERFORMED  Rawland Caddy 05/07/2024, 9:47 AM

## 2024-05-07 NOTE — Progress Notes (Signed)
 Physical Therapy Session Note  Patient Details  Name: Anita Reilly MRN: [id number] Date of Birth: Jul 05, 1946  Today's Date: 05/07/2024 PT Individual Time: 1191-4782 + 9562-1308 PT Individual Time Calculation (min): 58 min  + 70 min  Short Term Goals: Week 1:  PT Short Term Goal 1 (Week 1): STG=LTG due to LOS  Skilled Therapeutic Interventions/Progress Updates:      1st session: Pt presents in room, sitting in wheelchair, and in agreement to therapy treatment. Pt has no complaints of pain. Focused session on gait training, DC planning, and endurance training. Pt ambulates with no AD and CGA from her room to toilet - continent of bladder void - charted. Ambulated into the main gym from her room ~122ft with CGA and no AD - cues for widening BOS, upright posture, and natural arm swing bilaterally. Discussed potential DME rec's - possibly a rollator for community use only. Pt in agreement. Retrieved rollator and adjusted to fit and educated patient on purpose, safety features, and general use. Patient completed gait training with rollator > 759ft through hospital. Min cues for safety and rollator use. While outdoors, continued gait training on unlevel surfaces using the rollator - supervision for safety but no LOB or knee buckling observed. Brief seated rest break outdoors - discussed fall prevention, home safety, DC planning, etc. Pt reports feeling ready for DC - CSW notified of short LOS and DME/follow up recommendations. Pt returned upstairs to CIR floor and completed 5 + 5 minutes of standing UE Nustep Ergometer at L3 resistance using Visual Program to help with motivation and distraction during activity. Completed 1.9 METS, 0.3 miles, and >600 revolutions. Pt ambulated back to her room 265ft with supervision using the rollator, min cues for locking brakes while sitting. Pt left sitting up in wheelchair, needs met, aware of upcoming PT session.     2nd session: Pt sitting in wheelchair on  arrival - agreeable to PT treatment but fatigued from therapies. Pt reports no pain. Sit<>stand to rollator with supervision assist. Pt ambulated with supervision and rollator from her room to main gym, ~163ft.   Reviewed fall precautions and floor recovery. Pt completed floor transfer x2 times - CGA for first attempt and supervision for 2nd attempt. Patient timed for the 2nd attempt and able to complete supine to sitting in 15 seconds (!)  Worked on [employer] using [employer] system for added safety. NMR in [facility] included forward/backward walking (unsupported) while tossing small foam baseball, swinging baseball bat towards thrown ball (pt in side standing position). Pt demonstrating some delayed stepping strategies with balance recovery during tasks but still improved compared to yesterday.   Patient returned to her room and patient left sitting up in wheelchair with her needs met. Discussed general DC planning and patient eager to return home where she lives alone. Pt is approaching her mod I goals.    Therapy Documentation Precautions:  Precautions Precautions: Fall Precaution/Restrictions Comments: L hemi Restrictions Weight Bearing Restrictions Per Provider Order: No General:      Therapy/Group: Individual Therapy  Pheobe Brass 05/07/2024, 7:53 AM

## 2024-05-08 ENCOUNTER — Encounter

## 2024-05-08 ENCOUNTER — Other Ambulatory Visit (HOSPITAL_COMMUNITY): Payer: Self-pay

## 2024-05-08 DIAGNOSIS — I10 Essential (primary) hypertension: Secondary | ICD-10-CM | POA: Diagnosis not present

## 2024-05-08 DIAGNOSIS — K5901 Slow transit constipation: Secondary | ICD-10-CM | POA: Diagnosis not present

## 2024-05-08 DIAGNOSIS — R739 Hyperglycemia, unspecified: Secondary | ICD-10-CM | POA: Diagnosis not present

## 2024-05-08 DIAGNOSIS — I6381 Other cerebral infarction due to occlusion or stenosis of small artery: Secondary | ICD-10-CM | POA: Diagnosis not present

## 2024-05-08 MED ORDER — ASPIRIN 81 MG PO TBEC
DELAYED_RELEASE_TABLET | ORAL | 0 refills | Status: AC
Start: 2024-05-09 — End: 2024-05-20
  Filled 2024-05-08: qty 11, 11d supply, fill #0

## 2024-05-08 MED ORDER — PREGABALIN 25 MG PO CAPS
25.0000 mg | ORAL_CAPSULE | Freq: Two times a day (BID) | ORAL | 0 refills | Status: DC
  Filled 2024-05-08: qty 60, 30d supply, fill #0

## 2024-05-08 MED ORDER — ATORVASTATIN CALCIUM 40 MG PO TABS
40.0000 mg | ORAL_TABLET | Freq: Every day | ORAL | 0 refills | Status: DC
  Filled 2024-05-08: qty 30, 30d supply, fill #0

## 2024-05-08 MED ORDER — ACETAMINOPHEN 325 MG PO TABS: 650.0000 mg | ORAL_TABLET | ORAL | Status: DC | PRN

## 2024-05-08 MED ORDER — LOSARTAN POTASSIUM 50 MG PO TABS
50.0000 mg | ORAL_TABLET | Freq: Every day | ORAL | 0 refills | Status: AC
  Filled 2024-05-08: qty 30, 30d supply, fill #0

## 2024-05-08 MED ORDER — PANTOPRAZOLE SODIUM 40 MG PO TBEC
40.0000 mg | DELAYED_RELEASE_TABLET | Freq: Every day | ORAL | 0 refills | Status: AC
  Filled 2024-05-08: qty 30, 30d supply, fill #0

## 2024-05-08 MED ORDER — METFORMIN HCL ER 500 MG PO TB24
500.0000 mg | ORAL_TABLET | Freq: Two times a day (BID) | ORAL | 0 refills | Status: DC
  Filled 2024-05-08: qty 60, 30d supply, fill #0

## 2024-05-08 MED ORDER — CLOPIDOGREL BISULFATE 75 MG PO TABS
75.0000 mg | ORAL_TABLET | Freq: Every day | ORAL | 0 refills | Status: DC
  Filled 2024-05-08: qty 30, 30d supply, fill #0

## 2024-05-08 MED ORDER — METFORMIN HCL 500 MG PO TABS: 500.0000 mg | ORAL_TABLET | Freq: Two times a day (BID) | ORAL | Status: DC

## 2024-05-08 MED ORDER — POLYETHYLENE GLYCOL 3350 17 G PO PACK: 17.0000 g | PACK | Freq: Every day | ORAL | Status: DC | PRN

## 2024-05-08 NOTE — Progress Notes (Signed)
 Physical Therapy Discharge Summary  Patient Details  Name: Anita Reilly MRN: [id number] Date of Birth: Feb 26, 1946  Date of Discharge from PT service:May 08, 2024  Patient has met 8 of 8 long term goals due to improved activity tolerance, improved balance, improved postural control, increased strength, ability to compensate for deficits, functional use of  left upper extremity and left lower extremity, improved attention, improved awareness, and improved coordination.  Patient to discharge at an ambulatory level Modified Independent.   Patient's care partner is independent to provide the necessary physical assistance at discharge.  Reasons goals not met: n/a  Functional Outcome Measures: Grip strength on R: 52 lb Grip strength on L: 40 lb Timed Up and Go: 13.5 seconds  5 Times sit to Stand: 13 seconds  BERG: 54/56 Gait speed: 0.75 m/s   Recommendation:  Patient will benefit from ongoing skilled PT services in outpatient setting to continue to advance safe functional mobility, address ongoing impairments in dynamic standing balance, cardiovascular endurance, muscle weakness, and minimize fall risk.  Equipment: rollator  Reasons for discharge: treatment goals met and discharge from hospital  Patient/family agrees with progress made and goals achieved: Yes  PT Discharge Precautions/Restrictions Precautions Precautions: Fall Recall of Precautions/Restrictions: Intact Precaution/Restrictions Comments: L hemi (very mild) Restrictions Weight Bearing Restrictions Per Provider Order: No Pain Interference Pain Interference Pain Effect on Sleep: 0. Does not apply - I have not had any pain or hurting in the past 5 days Pain Interference with Therapy Activities: 0. Does not apply - I have not received rehabilitationtherapy in the past 5 days Pain Interference with Day-to-Day Activities: 1. Rarely or not at all Vision/Perception  Vision - History Ability to See in Adequate  Light: 0 Adequate Perception Perception: Within Functional Limits Praxis Praxis: WFL  Cognition Overall Cognitive Status: Within Functional Limits for tasks assessed Arousal/Alertness: Awake/alert Orientation Level: Oriented X4 Sustained Attention: Appears intact Memory: Appears intact Awareness: Appears intact Problem Solving: Appears intact Safety/Judgment: Appears intact Sensation Sensation Light Touch: Appears Intact Hot/Cold: Appears Intact Proprioception: Appears Intact Stereognosis: Appears Intact Coordination Gross Motor Movements are Fluid and Coordinated: Yes Fine Motor Movements are Fluid and Coordinated: Yes Motor  Motor Motor: Hemiplegia Motor - Discharge Observations: Mild L hemi  Mobility Bed Mobility Bed Mobility: Rolling Right;Rolling Left;Sit to Supine;Supine to Sit Rolling Right: Independent Rolling Left: Independent Supine to Sit: Independent Sit to Supine: Independent Transfers Transfers: Sit to Stand;Stand to [employer] Transfers Sit to Stand: Independent Stand to Sit: Independent Stand Pivot Transfers: Independent Transfer (Assistive device): None Locomotion  Gait Ambulation: Yes Gait Assistance: Independent with assistive device Gait Distance (Feet): 500 Feet Assistive device: Rollator Gait Gait: Yes Gait Pattern: Within Functional Limits Stairs / Additional Locomotion Stairs: Yes Stairs Assistance: Independent with assistive device Stair Management Technique: Two rails;Step to pattern;Forwards Number of Stairs: 12 Height of Stairs: 6 Pick up small object from the floor assist level: Independent with assistive device Wheelchair Mobility Wheelchair Mobility: No  Trunk/Postural Assessment  Cervical Assessment Cervical Assessment: Within Functional Limits Thoracic Assessment Thoracic Assessment: Within Functional Limits Lumbar Assessment Lumbar Assessment: Within Functional Limits Postural Control Postural Control: Deficits  on evaluation Protective Responses: Delayed stepping strategies to her L  Balance Balance Balance Assessed: Yes Standardized Balance Assessment Standardized Balance Assessment: Timed Up and Go Test;Berg Balance Test Berg Balance Test Sit to Stand: Able to stand without using hands and stabilize independently Standing Unsupported: Able to stand safely 2 minutes Sitting with Back Unsupported but Feet Supported on Floor or  Stool: Able to sit safely and securely 2 minutes Stand to Sit: Sits safely with minimal use of hands Transfers: Able to transfer safely, minor use of hands Standing Unsupported with Eyes Closed: Able to stand 10 seconds safely Standing Ubsupported with Feet Together: Able to place feet together independently and stand 1 minute safely From Standing, Reach Forward with Outstretched Arm: Can reach confidently >25 cm (10) From Standing Position, Pick up Object from Floor: Able to pick up shoe safely and easily From Standing Position, Turn to Look Behind Over each Shoulder: Looks behind from both sides and weight shifts well Turn 360 Degrees: Able to turn 360 degrees safely in 4 seconds or less Standing Unsupported, Alternately Place Feet on Step/Stool: Able to stand independently and safely and complete 8 steps in 20 seconds Standing Unsupported, One Foot in Front: Able to plae foot ahead of the other independently and hold 30 seconds Standing on One Leg: Able to lift leg independently and hold 5-10 seconds Total Score: 54 Timed Up and Go Test TUG: Normal TUG Normal TUG (seconds): 13.5 (W/ ROLLATOR) Static Sitting Balance Static Sitting - Balance Support: Feet supported;No upper extremity supported Static Sitting - Level of Assistance: 7: Independent Dynamic Sitting Balance Dynamic Sitting - Balance Support: Feet supported;No upper extremity supported Dynamic Sitting - Level of Assistance: 7: Independent Static Standing Balance Static Standing - Balance Support: No upper  extremity supported Static Standing - Level of Assistance: 7: Independent Dynamic Standing Balance Dynamic Standing - Balance Support: No upper extremity supported;During functional activity Dynamic Standing - Level of Assistance: 6: Modified independent (Device/Increase time) Extremity Assessment  RUE Assessment RUE Assessment: Within Functional Limits LUE Assessment LUE Assessment: Exceptions to [facility] Active Range of Motion (AROM) Comments: WFL General Strength Comments: 4+/5 grossly RLE Assessment RLE Assessment: Within Functional Limits LLE Assessment LLE Assessment: Exceptions to [facility] General Strength Comments: Grossly 4+/5   Jaymz Traywick P Larisha Vencill PT 05/08/2024, 8:55 AM

## 2024-05-08 NOTE — Patient Care Conference (Signed)
 Inpatient RehabilitationTeam Conference and Plan of Care Update Date: 05/08/2024   Time: 11:06 AM    Patient Name: Anita Reilly      Medical Record Number: [id number]  Date of Birth: 13-Jan-1946 Sex: Female         Room/Bed: 4W24C/4W24C-01 Payor Info: Payor: Advertising copywriter MEDICARE / Plan: [facility] MEDICARE / Product Type: *No Product type* /    Admit Date/Time:  05/03/2024  4:48 PM  Primary Diagnosis:  Lacunar infarction [facility])  Hospital Problems: Principal Problem:   Lacunar infarction [facility])    Expected Discharge Date: Expected Discharge Date: 05/09/24  Team Members Present: Physician leading conference: Dr. Lylia Sand Social Worker Present: Adrianna Albee, LCSW Nurse Present: Forrestine Ike, RN PT Present: Oma Bias, PT OT Present: Henrene Locust, OT SLP Present: Tally Faes, SLP PPS Coordinator present : Jestine Moron, SLP     Current Status/Progress Goal Weekly Team Focus  Bowel/Bladder   Continent of b/b   Remain continent   Assess qshift and prn    Swallow/Nutrition/ Hydration               ADL's   Goal level   Mod I   DC planning    Mobility   Goal level with rollator.   mod I  DC planning    Communication                Safety/Cognition/ Behavioral Observations               Pain   no c/o pain   Remain pain free   Assess qshift and prn    Skin   Skin intact   Maintain skin integrity  Assess qshift and prn      Discharge Planning:  HOme with family checking in on her and making sure needs are met. Very high level and ready for dc   Team Discussion: Patient post lacunar infarct; medically stable.  Patient on target to meet rehab goals: yes, currently mod I using a rollator.  Independent without an assistive device in the home level.   *See Care Plan and progress notes for long and short-term goals.   Revisions to Treatment Plan:  Mod I in the room   Teaching Needs: Safety, medications, dietary  modifications, transfers, toileting, etc.   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 slow transit constipation. sugars well controlled without meds  Barriers to Discharge: Medical stability   Possible Resolutions to [employer] Focus: daily assessment of labs and pt data with adjustment to medical regimen as required.   Continued Need for Acute Rehabilitation Level of Care: The patient requires daily medical management by a physician with specialized training in physical medicine and rehabilitation for the following reasons: Direction of a multidisciplinary physical rehabilitation program to maximize functional independence : Yes Medical management of patient stability for increased activity during participation in an intensive rehabilitation regime.: Yes Analysis of laboratory values and/or radiology reports with any subsequent need for medication adjustment and/or medical intervention. : Yes   I attest that I was present, lead the team conference, and concur with the assessment and plan of the team.   Forrestine Ike B 05/08/2024, 2:29 PM

## 2024-05-08 NOTE — Progress Notes (Signed)
 PROGRESS NOTE   Subjective/Complaints:  Pt up in bed. Feels well. No new complaints this morning. Anxious to get home.   ROS: Patient denies fever, rash, sore throat, blurred vision, dizziness, nausea, vomiting, diarrhea, cough, shortness of breath or chest pain, joint or back/neck pain, headache, or mood change.    Objective:   No results found. Recent Labs    05/06/24 0505  WBC 5.4  HGB 13.8  HCT 40.0  PLT 360   Recent Labs    05/06/24 0505  NA 136  K 4.4  CL 102  CO2 24  GLUCOSE 100*  BUN 18  CREATININE 1.05*  CALCIUM  9.0    Intake/Output Summary (Last 24 hours) at 05/08/2024 0853 Last data filed at 05/07/2024 1853 Gross per 24 hour  Intake 476 ml  Output --  Net 476 ml        Physical Exam: Vital Signs Blood pressure 130/83, pulse 79, temperature 97.7 F (36.5 C), temperature source Oral, resp. rate 17, SpO2 95%.   Constitutional: No distress . Vital signs reviewed. HEENT: NCAT, EOMI, oral membranes moist Neck: supple Cardiovascular: RRR without murmur. No JVD    Respiratory/Chest: CTA Bilaterally without wheezes or rales. Normal effort    GI/Abdomen: BS +, non-tender, non-distended Ext: no clubbing, cyanosis, or edema Psych: pleasant and cooperative  Neuro:  Alert and oriented x 3. Normal insight and awareness. Intact Memory. Normal language. Sl dysarthric speech. Cranial nerve exam unremarkable except central VII on left. MMT: RUE5/5. LUE 4+/5 deltoid, 4+ bicep,tricep, wrist, hand.  RLE  5/5. LLE 4/ to 4+/5 HF, KE and 4/5 ADF/PF. Sensory exam normal for light touch and pain in all 4 limbs. No limb ataxia or cerebellar signs. No abnormal tone appreciated.  Prior neuro assessment is c/w today's exam 05/08/2024.  Musculoskeletal: Full ROM, No pain with AROM or PROM in the neck, trunk, or extremities. Posture appropriate     Assessment/Plan: 1. Functional deficits which require 3+ hours per day of  interdisciplinary therapy in a comprehensive inpatient rehab setting. Physiatrist is providing close team supervision and 24 hour management of active medical problems listed below. Physiatrist and rehab team continue to assess barriers to discharge/monitor patient progress toward functional and medical goals  Care Tool:  Bathing    Body parts bathed by patient: Right arm, Left arm, Chest, Abdomen, Front perineal area, Buttocks, Right upper leg, Left upper leg, Right lower leg, Left lower leg, Face         Bathing assist Assist Level: Contact Guard/Touching assist     Upper Body Dressing/Undressing Upper body dressing   What is the patient wearing?: Pull over shirt    Upper body assist Assist Level: Set up assist    Lower Body Dressing/Undressing Lower body dressing      What is the patient wearing?: Pants, Underwear/pull up     Lower body assist Assist for lower body dressing: Contact Guard/Touching assist     Toileting Toileting    Toileting assist Assist for toileting: Contact Guard/Touching assist     Transfers Chair/bed transfer  Transfers assist     Chair/bed transfer assist level: Independent Chair/bed transfer assistive device: Armrests, Environmental consultant  Locomotion Ambulation   Ambulation assist      Assist level: Independent with assistive device Assistive device: Rollator Max distance: 561ft   Walk 10 feet activity   Assist     Assist level: Independent with assistive device Assistive device: Rollator   Walk 50 feet activity   Assist Walk 50 feet with 2 turns activity did not occur: Safety/medical concerns (fatigue, weakness)  Assist level: Independent with assistive device Assistive device: Rollator    Walk 150 feet activity   Assist Walk 150 feet activity did not occur: Safety/medical concerns (fatigue, weakness)  Assist level: Independent with assistive device Assistive device: Rollator    Walk 10 feet on uneven surface   activity   Assist     Assist level: Independent with assistive device Assistive device: Rollator   Wheelchair     Assist Is the patient using a wheelchair?: No Type of Wheelchair: Manual    Wheelchair assist level: Dependent - Patient 0% Max wheelchair distance: 1105ft    Wheelchair 50 feet with 2 turns activity    Assist        Assist Level: Dependent - Patient 0%   Wheelchair 150 feet activity     Assist      Assist Level: Dependent - Patient 0%   Blood pressure 130/83, pulse 79, temperature 97.7 F (36.5 C), temperature source Oral, resp. rate 17, SpO2 95%.  Medical Problem List and Plan: 1. 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  Status post TNK             -patient may shower             -ELOS/Goals: 7+ days, mod I to supervision goals  -Continue CIR therapies including PT and OT. Interdisciplinary team conference today to discuss goals, barriers to discharge, and dc planning.    2.  Antithrombotics: -DVT/anticoagulation:  Mechanical: Antiembolism stockings, thigh (TED hose) Bilateral lower extremities             -antiplatelet therapy: Aspirin  81 mg daily and Plavix  75 mg daily x 3 weeks then Plavix  alone 3. Pain Management: Lyrica  25 mg twice daily             -pain controlled 4. Mood/Behavior/Sleep: Trazodone  50 mg nightly as needed.  Provide emotional support             -antipsychotic agents: N/A 5. Neuropsych/cognition: This patient is capable of making decisions on her own behalf. 6. Skin/Wound Care: Routine skin checks 7. Fluids/Electrolytes/Nutrition: pt with consistent intake  -recent labs wnl. Eating well  8.  Hypertension.  Cozaar  50 mg daily.  Monitor with increased mobility 9.  Hyperlipidemia.  Lipitor  10.  History of right face Bell's palsy.  Maintained on Lyrica  25 mg twice daily follow-up as outpatient 11.  GERD.  Protonix  12.  ?Borderline DM, Hemoglobin A1c 6.2.   Consider adding  metformin.             -changed cbg's to daily, staggered, QAM and QSupper every other day  -on regular diet so that she can have vegetables. Pt will avoid sugars, carbs.             -no SSI  -6/18 sugars are controlled. Dc cbg's   -needs follow up as outpt  13.  Constipation.  MiraLAX  daily, Senokot S,1 tablet at bedtime as needed   -LBM 6/17-- miralax  works for. May need when she goes home LOS: 5 days A FACE TO FACE EVALUATION WAS  PERFORMED  Rawland Caddy 05/08/2024, 8:53 AM

## 2024-05-08 NOTE — Progress Notes (Signed)
 Occupational Therapy Session Note  Patient Details  Name: Anita Reilly MRN: [id number] Date of Birth: [birth year]  Today's Date: 05/08/2024 OT Individual Time: 4098-1191 OT Individual Time Calculation (min): 67 min    Short Term Goals: Week 1:  OT Short Term Goal 1 (Week 1): Pt will complete tub transfer with TTB with supervision OT Short Term Goal 2 (Week 1): Pt will complete toilet transfer with supervision  Skilled Therapeutic Interventions/Progress Updates:  Pt greeted sitting in [facility] for skilled OT session with focus on LUE FMC/strengthening.   Pain: Pt with no reports of pain. OT offering intermediate rest breaks and positioning suggestions throughout session to address potential pain/fatigue and maximize participation/safety in session.   Functional Transfers: All transfers completed this session with supervision progressing to Mod I, use of rollator for long distance ambulation in similar fashion.   Self Care Tasks: Pt completes the following self care tasks with levels of assistance noted below, 3/3 toileting tasks with distant supervision progressing to Mod I.   Therapeutic Activities: 9 Hole Peg Test is used to measure finger dexterity in pts with various neurological diagnoses. The pt completed the test in ~31  seconds.  - Norms for healthy females ages 64-70+ 9-55 R 17.38 L 18.92 56-60 R 17.86 L 19.48 61-65 R 18.99 L 20.33 66-70 R 19.90 L 21.44 71+ R 22.49 L 24.11  Pt then instructed in series [facility] activities, targeting pincer and tripod grasp as well as palm translation. Handout provided on activities to perform at home with common household items. Pt receptive asking appropriate questions about recovery. Encouraged with education provided.   Pt remained sitting in WC with 4Ps assessed and immediate needs met.   Therapy Documentation Precautions:  Precautions Precautions: Fall Precaution/Restrictions Comments: L hemi Restrictions Weight Bearing Restrictions  Per Provider Order: No   Therapy/Group: Individual Therapy  Artemus Biles, OTR/L, MSOT  05/08/2024, 6:23 AM

## 2024-05-08 NOTE — Progress Notes (Signed)
 Occupational Therapy Session Note  Patient Details  Name: Anita Reilly MRN: [id number] Date of Birth: Nov 15, 1946  Today's Date: 05/08/2024 OT Individual Time: 1436-1530 OT Individual Time Calculation (min): 54 min    Short Term Goals: Week 1:  OT Short Term Goal 1 (Week 1): Pt will complete tub transfer with TTB with supervision OT Short Term Goal 2 (Week 1): Pt will complete toilet transfer with supervision  Skilled Therapeutic Interventions/Progress Updates:     Pt received lightly sleeping in bed presenting to be in good spirits receptive to skilled OT session reporting 0/10 pain- OT offering intermittent rest breaks, repositioning, and therapeutic support to optimize participation in therapy session. Focused this session on community reintegration and Pt education to improve safety and readiness for d/c. Engaged Pt in completing simulated community outing navigating around different spaces in hospital using rollator including gift shop, cafeteria, and outdoor environments. Focused activities on safe navigation of obstacles, application of energy conservation techniques, and safety awareness using rollator. Pt was able to appropriately request rest breaks, negotiate obstacles, and recall need to lock rollator breaks mod I udring activities navigating >600 ft total during session. During rest breaks, provided additional education on FM coordination activities to complete at d/c and techniques for how to incorporate L UE into functional activities throughout her day to improve functional use with handout provided. Pt receptive to education and motivated to learn. Pt reporting she feels prepared for d/c tomorrow and that all of her questions were answered. Pt was left resting in bed with call bell in reach and all needs met.     Therapy Documentation Precautions:  Precautions Precautions: Fall Recall of Precautions/Restrictions: Intact Precaution/Restrictions Comments: L hemi (very  mild) Restrictions Weight Bearing Restrictions Per Provider Order: No Pain: Pain Assessment Pain Scale: 0-10 Pain Score: 0-No pain ADL: ADL Eating: Modified independent Where Assessed-Eating: Wheelchair Grooming: Modified independent Where Assessed-Grooming: Sitting at sink, Standing at sink Upper Body Bathing: Modified independent Where Assessed-Upper Body Bathing: Shower Lower Body Bathing: Modified independent Where Assessed-Lower Body Bathing: Shower Upper Body Dressing: Modified independent (Device) Where Assessed-Upper Body Dressing: Wheelchair Lower Body Dressing: Modified independent Where Assessed-Lower Body Dressing: Wheelchair Toileting: Modified independent Where Assessed-Toileting: Teacher, adult education: Engineer, agricultural Method: Insurance claims handler: Modified independent Web designer Method: Ship broker: Insurance underwriter: Modified independent Film/video editor Method: Designer, industrial/product: Emergency planning/management officer   Therapy/Group: Individual Therapy  Geoffery Kiel 05/08/2024, 2:47 PM

## 2024-05-08 NOTE — Plan of Care (Signed)
  Problem: RH Balance Goal: LTG Patient will maintain dynamic standing with ADLs (OT) Description: LTG:  Patient will maintain dynamic standing balance with assist during activities of daily living (OT)  Outcome: Completed/Met   Problem: Sit to Stand Goal: LTG:  Patient will perform sit to stand in prep for activites of daily living with assistance level (OT) Description: LTG:  Patient will perform sit to stand in prep for activites of daily living with assistance level (OT) Outcome: Completed/Met   Problem: RH Bathing Goal: LTG Patient will bathe all body parts with assist levels (OT) Description: LTG: Patient will bathe all body parts with assist levels (OT) Outcome: Completed/Met   Problem: RH Dressing Goal: LTG Patient will perform lower body dressing w/assist (OT) Description: LTG: Patient will perform lower body dressing with assist, with/without cues in positioning using equipment (OT) Outcome: Completed/Met   Problem: RH Toileting Goal: LTG Patient will perform toileting task (3/3 steps) with assistance level (OT) Description: LTG: Patient will perform toileting task (3/3 steps) with assistance level (OT)  Outcome: Completed/Met   Problem: RH Simple Meal Prep Goal: LTG Patient will perform simple meal prep w/assist (OT) Description: LTG: Patient will perform simple meal prep with assistance, with/without cues (OT). Outcome: Completed/Met   Problem: RH Toilet Transfers Goal: LTG Patient will perform toilet transfers w/assist (OT) Description: LTG: Patient will perform toilet transfers with assist, with/without cues using equipment (OT) Outcome: Completed/Met   Problem: RH Tub/Shower Transfers Goal: LTG Patient will perform tub/shower transfers w/assist (OT) Description: LTG: Patient will perform tub/shower transfers with assist, with/without cues using equipment (OT) Outcome: Completed/Met

## 2024-05-08 NOTE — Progress Notes (Signed)
 Physical Therapy Session Note  Patient Details  Name: Anita Reilly MRN: [id number] Date of Birth: [birth year]  Today's Date: 05/08/2024 PT Individual Time: 0802-0900 + 1030-1100  PT Individual Time Calculation (min): 58 min  + 30 min  Short Term Goals: Week 1:  PT Short Term Goal 1 (Week 1): STG=LTG due to LOS  Skilled Therapeutic Interventions/Progress Updates:      1st session: Pt in bed on arrival - in agreement to therapy treatment. Pt has no complaints of pain. Pt feels ready for DC and discussed general DC plans - will updated post team conference when final decision made on DC date.   Bed mobility completed independently. Pt requesting to toilet before leaving her room. Ambulates without AD at distant supervision level to the bathroom - pt continent of bladder void and completes without assist. LPN made aware.   She ambulated at mod I level from her room to main gym 145ft using her rollator. Completed functional outcome measure assessment for falls risk and functional balance. See below for details.  Grip strength on R: 52 lb Grip strength on L: 40 lb *Age/sex match norm = 52 lb. Pt improved L grip strength by 11 lb since admission!  Car transfer completed with car height simulating her personal vehicle - patient completed at mod I level using the rollator.   Pt returned to her room and was left sitting up in wheelchair with her needs met. Pt made independent in the room - nursing made aware and sign provided outside her room.   2nd session: Pt presents in wheelchair with family friends present. Pt has no complaints of pain. Requests to toilet before leaving her room. Sit<>stand independent from wheelchair and ambulates independently with no AD into the bathroom. Pt able to complete all toileting tasks without assist. Pt's rollator delivered to her room - already adjusted to fit her height. She ambulates mod I with rollator 142ft to day room gym. She was assisted onto  Nustep and she completed 12 minutes at L3 resistance using BUE/BLE for whole body strengthening. She was able to maintain around 70spm cadence throughout the time. 0.4 miles and > 750 steps total. Pt returned to her room mod I level using the rollator. Ended session sitting in wheelchair with needs met.   Therapy Documentation Precautions:  Precautions Precautions: Fall Precaution/Restrictions Comments: L hemi Restrictions Weight Bearing Restrictions Per Provider Order: No General:  Balance Balance Balance Assessed: Yes Standardized Balance Assessment Standardized Balance Assessment: Timed Up and Go Test;Berg Balance Test Berg Balance Test Sit to Stand: Able to stand without using hands and stabilize independently Standing Unsupported: Able to stand safely 2 minutes Sitting with Back Unsupported but Feet Supported on Floor or Stool: Able to sit safely and securely 2 minutes Stand to Sit: Sits safely with minimal use of hands Transfers: Able to transfer safely, minor use of hands Standing Unsupported with Eyes Closed: Able to stand 10 seconds safely Standing Ubsupported with Feet Together: Able to place feet together independently and stand 1 minute safely From Standing, Reach Forward with Outstretched Arm: Can reach confidently >25 cm (10) From Standing Position, Pick up Object from Floor: Able to pick up shoe safely and easily From Standing Position, Turn to Look Behind Over each Shoulder: Looks behind from both sides and weight shifts well Turn 360 Degrees: Able to turn 360 degrees safely in 4 seconds or less Standing Unsupported, Alternately Place Feet on Step/Stool: Able to stand independently and safely and complete 8 steps  in 20 seconds Standing Unsupported, One Foot in Front: Able to plae foot ahead of the other independently and hold 30 seconds Standing on One Leg: Able to lift leg independently and hold 5-10 seconds Total Score: 54/56 Timed Up and Go Test TUG: Normal  TUG Normal TUG (seconds): 13.5 (W/ ROLLATOR) 5 Times sit to stand: 13 seconds  Therapy/Group: Individual Therapy  Pheobe Brass 05/08/2024, 7:50 AM

## 2024-05-08 NOTE — Progress Notes (Signed)
 Patient ID: Anita Reilly, female   DOB: Feb 12, 1946, 78 y.o.   MRN: [id number] Met with pt and left message for son via telephone to give team conference update regarding goals of mod/I level and discharge tomorrow. She has received the rollator and will make a OP therapy referral to [facility]. Who will call to set up follow up appointments. Await son's return call

## 2024-05-08 NOTE — Progress Notes (Signed)
 Inpatient Rehabilitation Discharge Medication Review by a Pharmacist   A complete drug regimen review was completed for this patient to identify any potential clinically significant medication issues.   High Risk Drug Classes Is patient taking? Indication by Medication  Antipsychotic No    Anticoagulant No    Antibiotic No    Opioid No    Antiplatelet Yes Aspirin  81 mg and clopidogrel  x 3 weeks, then clopidogrel  alone - CVA prophylaxis   Hypoglycemics/insulin Yes Metformin XR - DM Type 2  Vasoactive Medication Yes Losartan  - hypertension  Chemotherapy No    Other Yes Atorvastatin  - hyperlipidemia Pantoprazole  - GERD Pregablin - neuropathic pain Calcium  + Vitamin D - supplement  PRNs: Acetaminophen  - mild pain, fever Miralax  - constipation         Type of Medication Issue Identified Description of Issue Recommendation(s)  Drug Interaction(s) (clinically significant)        Duplicate Therapy        Allergy        No Medication Administration End Date        Incorrect Dose        Additional Drug Therapy Needed        Significant med changes from prior encounter (inform family/care partners about these prior to discharge). New: Clopidogrel , Aspirin , Atorvastatin , Metformin XR  Caltrate 600 + D resuming at discharge.  Desipramine discontinued. Communicate changes with patient/family prior to discharge.  Other            Clinically significant medication issues were identified that warrant physician communication and completion of prescribed/recommended actions by midnight of the next day:  No   Pharmacist comments:  - Aspirin  and Clopidogrel  for 3 weeks, then Clopidogrel  alone. Begun 04/30/24. Last Aspirin  dose to be 05/20/24.   Time spent performing this drug regimen review (minutes):  15 minutes  Adolphus Akin, [place] 05/08/2024 5:51 PM

## 2024-05-08 NOTE — Progress Notes (Signed)
 Occupational Therapy Discharge Summary  Patient Details  Name: Anita Reilly MRN: [id number] Date of Birth: [birth year]  Date of Discharge from OT service:May 08, 2024  Patient has met 8 of 8 long term goals due to improved activity tolerance, improved balance, postural control, ability to compensate for deficits, functional use of  LEFT lower extremity, improved attention, improved awareness, and improved coordination.  Patient to discharge at overall Modified Independent level.  Patient's care partner is independent to provide the necessary physical and cognitive assistance at discharge.    Reasons goals not met: NA  Recommendation:  Patient will benefit from ongoing skilled OT services in outpatient setting to continue to advance functional skills in the area of BADL and iADL.  Equipment: TTB  Reasons for discharge: treatment goals met and discharge from hospital  Patient/family agrees with progress made and goals achieved: Yes  OT Discharge Precautions/Restrictions  Precautions Precautions: Fall Recall of Precautions/Restrictions: Intact Precaution/Restrictions Comments: L hemi Restrictions Weight Bearing Restrictions Per Provider Order: No ADL ADL Eating: Modified independent Where Assessed-Eating: Wheelchair Grooming: Modified independent Where Assessed-Grooming: Sitting at sink, Standing at sink Upper Body Bathing: Modified independent Where Assessed-Upper Body Bathing: Shower Lower Body Bathing: Modified independent Where Assessed-Lower Body Bathing: Shower Upper Body Dressing: Modified independent (Device) Where Assessed-Upper Body Dressing: Wheelchair Lower Body Dressing: Modified independent Where Assessed-Lower Body Dressing: Wheelchair Toileting: Modified independent Where Assessed-Toileting: Teacher, adult education: Engineer, agricultural Method: Insurance claims handler: Modified independent Web designer Method:  Ship broker: Insurance underwriter: Modified independent Film/video editor Method: Designer, industrial/product: Sales promotion account executive Baseline Vision/History: 1 Wears glasses Patient Visual Report: No change from baseline Vision Assessment?: Wears glasses for reading;No apparent visual deficits Perception  Perception: Within Functional Limits Praxis Praxis: WFL Cognition Cognition Overall Cognitive Status: Within Functional Limits for tasks assessed Arousal/Alertness: Awake/alert Sustained Attention: Appears intact Awareness: Appears intact Safety/Judgment: Appears intact Brief Interview for Mental Status (BIMS) Repetition of Three Words (First Attempt): 3 Temporal Orientation: Year: Correct Temporal Orientation: Month: Accurate within 5 days Temporal Orientation: Day: Correct Recall: Sock: Yes, no cue required Recall: Blue: Yes, no cue required Recall: Bed: Yes, no cue required BIMS Summary Score: 15 Sensation Sensation Light Touch: Appears Intact Hot/Cold: Appears Intact Proprioception: Appears Intact Stereognosis: Appears Intact Coordination Gross Motor Movements are Fluid and Coordinated: Yes Fine Motor Movements are Fluid and Coordinated: Yes Motor  Motor Motor: Hemiplegia Motor - Discharge Observations: Mild L hemi Mobility  Bed Mobility Bed Mobility: Rolling Right;Rolling Left;Sit to Supine;Supine to Sit Rolling Right: Independent Rolling Left: Independent Supine to Sit: Independent Sit to Supine: Independent Transfers Sit to Stand: Independent Stand to Sit: Independent  Trunk/Postural Assessment  Cervical Assessment Cervical Assessment: Within Functional Limits Thoracic Assessment Thoracic Assessment: Within Functional Limits Lumbar Assessment Lumbar Assessment: Within Functional Limits Postural Control Postural Control: Deficits on evaluation Protective Responses: Delayed stepping  strategies to her L  Balance Balance Balance Assessed: Yes Standardized Balance Assessment Standardized Balance Assessment: Timed Up and Go Test Static Sitting Balance Static Sitting - Balance Support: Feet supported;No upper extremity supported Static Sitting - Level of Assistance: 7: Independent Dynamic Sitting Balance Dynamic Sitting - Balance Support: Feet supported;No upper extremity supported Dynamic Sitting - Level of Assistance: 7: Independent Static Standing Balance Static Standing - Balance Support: No upper extremity supported Static Standing - Level of Assistance: 7: Independent Dynamic Standing Balance Dynamic Standing - Balance Support: No upper extremity supported;During functional activity Dynamic Standing - Level of  Assistance: 6: Modified independent (Device/Increase time) Extremity/Trunk Assessment RUE Assessment RUE Assessment: Within Functional Limits LUE Assessment LUE Assessment: Exceptions to [facility] Active Range of Motion (AROM) Comments: [facility] General Strength Comments: 4+/5 grossly   Artemus Biles, OTR/L, MSOT  05/08/2024, 8:00 AM

## 2024-05-08 NOTE — Plan of Care (Signed)
  Problem: Consults Goal: RH STROKE PATIENT EDUCATION Description: See Patient Education module for education specifics  Outcome: Progressing   Problem: RH BOWEL ELIMINATION Goal: RH STG MANAGE BOWEL WITH ASSISTANCE Description: STG Manage Bowel with toileting Assistance. Outcome: Progressing Goal: RH STG MANAGE BOWEL W/MEDICATION W/ASSISTANCE Description: STG Manage Bowel with Medication with mod I Assistance. Outcome: Progressing   Problem: RH SAFETY Goal: RH STG ADHERE TO SAFETY PRECAUTIONS W/ASSISTANCE/DEVICE Description: STG Adhere to Safety Precautions With cues Assistance/Device. Outcome: Progressing   Problem: RH PAIN MANAGEMENT Goal: RH STG PAIN MANAGED AT OR BELOW PT'S PAIN GOAL Description: < 4 with prns Outcome: Progressing

## 2024-05-09 DIAGNOSIS — Z794 Long term (current) use of insulin: Secondary | ICD-10-CM

## 2024-05-09 DIAGNOSIS — E119 Type 2 diabetes mellitus without complications: Secondary | ICD-10-CM

## 2024-05-09 NOTE — Progress Notes (Signed)
 PROGRESS NOTE   Subjective/Complaints:  Pt in chair. Had questions about her stroke. Using prevagen at home  ROS: Patient denies fever, rash, sore throat, blurred vision, dizziness, nausea, vomiting, diarrhea, cough, shortness of breath or chest pain, joint or back/neck pain, headache, or mood change.    Objective:   No results found. No results for input(s): WBC, HGB, HCT, PLT in the last 72 hours.  No results for input(s): NA, K, CL, CO2, GLUCOSE, BUN, CREATININE, CALCIUM  in the last 72 hours.   Intake/Output Summary (Last 24 hours) at 05/09/2024 0854 Last data filed at 05/09/2024 0733 Gross per 24 hour  Intake 954 ml  Output --  Net 954 ml        Physical Exam: Vital Signs Blood pressure 133/80, pulse 78, temperature 97.7 F (36.5 C), temperature source Oral, resp. rate 17, SpO2 96%.   Constitutional: No distress . Vital signs reviewed. HEENT: NCAT, EOMI, oral membranes moist Neck: supple Cardiovascular: RRR without murmur. No JVD    Respiratory/Chest: CTA Bilaterally without wheezes or rales. Normal effort    GI/Abdomen: BS +, non-tender, non-distended Ext: no clubbing, cyanosis, or edema Psych: pleasant and cooperative  Neuro:  Alert and oriented x 3. Normal insight and awareness. Intact Memory. Normal language. Speech clear. Cranial nerve exam unremarkable except central VII on left. MMT: RUE5/5. LUE 4+/5 deltoid, 4+ bicep,tricep, wrist, hand.  RLE  5/5. LLE 4+/ to 4+/5 HF, KE and 4+/5 ADF/PF. Sensory exam normal for light touch and pain in all 4 limbs. No limb ataxia or cerebellar signs. No abnormal tone appreciated.  Prior neuro assessment is c/w today's exam 05/09/2024.  Musculoskeletal: Full ROM, No pain with AROM or PROM in the neck, trunk, or extremities. Posture appropriate     Assessment/Plan: 1. Functional deficits which require 3+ hours per day of interdisciplinary therapy in a  comprehensive inpatient rehab setting. Physiatrist is providing close team supervision and 24 hour management of active medical problems listed below. Physiatrist and rehab team continue to assess barriers to discharge/monitor patient progress toward functional and medical goals  Care Tool:  Bathing    Body parts bathed by patient: Right arm, Left arm, Chest, Abdomen, Front perineal area, Buttocks, Right upper leg, Left upper leg, Right lower leg, Left lower leg, Face         Bathing assist Assist Level: Independent with assistive device     Upper Body Dressing/Undressing Upper body dressing   What is the patient wearing?: Pull over shirt    Upper body assist Assist Level: Independent with assistive device    Lower Body Dressing/Undressing Lower body dressing      What is the patient wearing?: Underwear/pull up, Pants     Lower body assist Assist for lower body dressing: Independent with assitive device     Toileting Toileting    Toileting assist Assist for toileting: Independent with assistive device     Transfers Chair/bed transfer  Transfers assist     Chair/bed transfer assist level: Independent Chair/bed transfer assistive device: Armrests, Geologist, engineering   Ambulation assist      Assist level: Independent with assistive device Assistive device: Rollator Max distance: 529ft  Walk 10 feet activity   Assist     Assist level: Independent with assistive device Assistive device: Rollator   Walk 50 feet activity   Assist Walk 50 feet with 2 turns activity did not occur: Safety/medical concerns (fatigue, weakness)  Assist level: Independent with assistive device Assistive device: Rollator    Walk 150 feet activity   Assist Walk 150 feet activity did not occur: Safety/medical concerns (fatigue, weakness)  Assist level: Independent with assistive device Assistive device: Rollator    Walk 10 feet on uneven surface   activity   Assist     Assist level: Independent with assistive device Assistive device: Rollator   Wheelchair     Assist Is the patient using a wheelchair?: No Type of Wheelchair: Manual    Wheelchair assist level: Dependent - Patient 0% Max wheelchair distance: 143ft    Wheelchair 50 feet with 2 turns activity    Assist        Assist Level: Dependent - Patient 0%   Wheelchair 150 feet activity     Assist      Assist Level: Dependent - Patient 0%   Blood pressure 133/80, pulse 78, temperature 97.7 F (36.5 C), temperature source Oral, resp. rate 17, SpO2 96%.  Medical Problem List and Plan: 1. 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  Status post TNK             -dc home today -f/u with pcp and neurology. I don't need to see her back in office.   2.  Antithrombotics: -DVT/anticoagulation:  Mechanical: Antiembolism stockings, thigh (TED hose) Bilateral lower extremities             -antiplatelet therapy: Aspirin  81 mg daily and Plavix  75 mg daily x 3 weeks then Plavix  alone 3. Pain Management: Lyrica  25 mg twice daily             -pain controlled--continue at home 4. Mood/Behavior/Sleep: Trazodone  50 mg nightly as needed.  Provide emotional support             -antipsychotic agents: N/A 5. Neuropsych/cognition: This patient is capable of making decisions on her own behalf. 6. Skin/Wound Care: Routine skin checks 7. Fluids/Electrolytes/Nutrition: pt with consistent intake  -recent labs wnl. Eating well  8.  Hypertension.  Cozaar  50 mg daily.  Bp controlled 9.  Hyperlipidemia.  Lipitor  10.  History of right face Bell's palsy.  Maintained on Lyrica  25 mg twice daily follow-up as outpatient 11.  GERD.  Protonix  12.  ?Borderline DM, Hemoglobin A1c 6.2.   Consider adding metformin.             -changed cbg's to daily, staggered, QAM and QSupper every other day  -on regular diet so that she can have  vegetables. Pt will avoid sugars, carbs.             -no SSI  -6/19 sugars are controlled with diet -  -f/u with primary, ? Hg A1C as oupt.  13.  Constipation.  MiraLAX  daily, Senokot S,1 tablet at bedtime as needed   -LBM 6/17-- miralax  has worked well for her. Continue at home LOS: 6 days A FACE TO FACE EVALUATION WAS PERFORMED  Rawland Caddy 05/09/2024, 8:54 AM

## 2024-05-09 NOTE — Progress Notes (Signed)
 Inpatient Rehabilitation Care Coordinator Discharge Note   Patient Details  Name: Alphonsine Minium MRN: [id number] Date of Birth: 02/25/1946   Discharge location: HOME WITH FMAILY CHECKING ON HER DID VERY WELL HERE  Length of Stay: 6 DAYS  Discharge activity level: MOD/I LEVEL  Home/community participation: ACTIVE  Patient response WU:[id number] Literacy - How often do you need to have someone help you when you read instructions, pamphlets, or other written material from your doctor or pharmacy?: Never  Patient response YN:WGNFAO Isolation - How often do you feel lonely or isolated from those around you?: Never  Services provided included: MD, RD, PT, OT, SLP, RN, CM, TR, Pharmacy, SW  Financial Services:  Financial Services Utilized: Private Insurance [facility] MEDICARE  Choices offered to/list presented to: PT  Follow-up services arranged:  Outpatient, DME, Patient/Family has no preference for HH/DME agencies    Outpatient Servicies: [facility] OPPT AND OT WILL CALL TO SET UP FOLLOW UP APPOINTMENTS DME : [facility]    Patient response to transportation need: Is the patient able to respond to transportation needs?: Yes In the past 12 months, has lack of transportation kept you from medical appointments or from getting medications?: No In the past 12 months, has lack of transportation kept you from meetings, work, or from getting things needed for daily living?: No   Patient/Family verbalized understanding of follow-up arrangements:  Yes  Individual responsible for coordination of the follow-up plan: SELF AND DAVID-SON [phone]  Confirmed correct DME delivered: Mardell Shade 05/09/2024    Comments (or additional information):DID WELL AND REACHED HER GOALS QUICKLY. ONLY NEEDS INTERMITTENT ASSIST FROM FAMILY  Summary of Stay    Date/Time Discharge Planning CSW  05/08/24 0900 HOme with family checking in on her and making sure needs are met. Very high level and ready  for dc RGD       Tyshun Tuckerman, Maralee Senate

## 2024-05-20 ENCOUNTER — Encounter: Attending: Physical Medicine and Rehabilitation | Admitting: Physical Medicine and Rehabilitation

## 2024-05-20 ENCOUNTER — Encounter: Payer: Self-pay | Admitting: Physical Medicine and Rehabilitation

## 2024-05-20 VITALS — BP 144/91 | HR 99 | Ht 64.0 in | Wt 166.2 lb

## 2024-05-20 DIAGNOSIS — E785 Hyperlipidemia, unspecified: Secondary | ICD-10-CM | POA: Diagnosis present

## 2024-05-20 DIAGNOSIS — Z79899 Other long term (current) drug therapy: Secondary | ICD-10-CM | POA: Insufficient documentation

## 2024-05-20 DIAGNOSIS — R7303 Prediabetes: Secondary | ICD-10-CM | POA: Insufficient documentation

## 2024-05-20 DIAGNOSIS — I639 Cerebral infarction, unspecified: Secondary | ICD-10-CM | POA: Insufficient documentation

## 2024-05-20 DIAGNOSIS — I10 Essential (primary) hypertension: Secondary | ICD-10-CM | POA: Insufficient documentation

## 2024-05-20 DIAGNOSIS — R4189 Other symptoms and signs involving cognitive functions and awareness: Secondary | ICD-10-CM | POA: Diagnosis not present

## 2024-05-20 NOTE — Patient Instructions (Addendum)
 Coenzyme Q10  Foods that may reduce pain: 1) Ginger (especially studied for arthritis)- reduce leukotriene production to decrease inflammation 2) Blueberries- high in phytonutrients that decrease inflammation 3) Salmon- marine omega-3s reduce joint swelling and pain 4) Pumpkin seeds- reduce inflammation 5) dark chocolate- reduces inflammation 6) turmeric- reduces inflammation 7) tart cherries - reduce pain and stiffness 8) extra virgin olive oil - its compound olecanthal helps to block prostaglandins  9) chili peppers- can be eaten or applied topically via capsaicin 10) mint- helpful for headache, muscle aches, joint pain, and itching 11) garlic- reduces inflammation 12) Green tea- reduces inflammation and oxidative stress, helps with weight loss, may reduce the risk of cancer, recommend Double Green Matcha [place] of Tea daily  Link to further information on diet for chronic pain: [email or web address]

## 2024-05-20 NOTE — Progress Notes (Signed)
 Subjective:    Patient ID: Anita Reilly, female    DOB: [birth year], 78 y.o.   MRN: [id number]  HPI Anita Reilly is a 78 year old woman who presents to establish care for stroke.  1) Stroke: -she is recovering well -this was her first hospitalization since she had kids -since she has been home she feels that her cognition has been impaired- her son notes this too- her son feels this happened in the hospital -she feels that this could be due to the drama she experienced in the hospital -has not been needing walker as much  2) HLD -reviewed her lipid panel  Pain Inventory Average Pain 0 Pain Right Now 0 My pain is intermittent  LOCATION OF PAIN  Left knee  BOWEL  Oral laxative use No  Enema or suppository use No  History of colostomy No  Incontinent No   BLADDER Normal    Mobility walk without assistance  Function retired Do you have any goals in this area?  no  Neuro/Psych No problems in this area weakness  Prior Studies Any changes since last visit?  no  Physicians involved in your care Any changes since last visit?  no   Family History  Problem Relation Age of Onset   Breast cancer Neg Hx    Social History   Socioeconomic History   Marital status: Widowed    Spouse name: Not on file   Number of children: Not on file   Years of education: Not on file   Highest education level: Not on file  Occupational History   Not on file  Tobacco Use   Smoking status: Never   Smokeless tobacco: Never  Vaping Use   Vaping status: Never Used  Substance and Sexual Activity   Alcohol use: No   Drug use: No   Sexual activity: Not on file  Other Topics Concern   Not on file  Social History Narrative   Not on file   Social Drivers of Health   Financial Resource Strain: Low Risk  (10/31/2023)   Received from [facility]   Overall Financial Resource Strain (CARDIA)    Difficulty of Paying Living Expenses: Not hard at all   Food Insecurity: No Food Insecurity (04/28/2024)   Hunger Vital Sign    Worried About Running Out of Food in the Last Year: Never true    Ran Out of Food in the Last Year: Never true  Transportation Needs: No Transportation Needs (04/28/2024)   PRAPARE - Administrator, Civil Service (Medical): No    Lack of Transportation (Non-Medical): No  Physical Activity: Not on file  Stress: Not on file  Social Connections: Unknown (04/28/2024)   Social Connection and Isolation Panel    Frequency of Communication with Friends and Family: More than three times a week    Frequency of Social Gatherings with Friends and Family: Three times a week    Attends Religious Services: Not on file    Active Member of Clubs or Organizations: Not on file    Attends Club or Organization Meetings: More than 4 times per year    Marital Status: Widowed   Past Surgical History:  Procedure Laterality Date   BREAST BIOPSY Left 05/06/2021   u/s bx, hydromark, path pending   CARPAL TUNNEL RELEASE     left    CATARACT EXTRACTION W/PHACO Right 09/19/2018   Procedure: CATARACT EXTRACTION PHACO AND INTRAOCULAR LENS PLACEMENT (IOC) RIGHT;  Surgeon: Anita Reilly,  Dene, MD;  Location[facility];  Service: Ophthalmology;  Laterality: Right;  latex sensitivity   CATARACT EXTRACTION W/PHACO Left 10/10/2018   Procedure: CATARACT EXTRACTION PHACO AND INTRAOCULAR LENS PLACEMENT (IOC)  LEFT;  Surgeon: Anita Reilly Dene, MD;  Location: [facility];  Service: Ophthalmology;  Laterality: Left;  Latex sensitivity requests arrival around 10 or 10:30   COLONOSCOPY     Past Medical History:  Diagnosis Date   Arthritis    shoulders   Bell's palsy 2016   Vertigo    none in several years   BP (!) 144/91 (BP Location: Left Arm, Patient Position: Sitting)   Pulse 99   Ht 5' 4 (1.626 m)   Wt 166 lb 3.2 oz (75.4 kg)   SpO2 93%   BMI 28.53 kg/m   Opioid Risk Score:   Fall Risk Score:   `1  Depression screen Anita Reilly 2/9     05/20/2024    3:04 PM  Depression screen PHQ 2/9  Decreased Interest 0  Down, Depressed, Hopeless 0  PHQ - 2 Score 0     Review of Systems  All other systems reviewed and are negative.      Objective:   Physical Exam Gen: no distress, normal appearing HEENT: oral mucosa pink and moist, NCAT Cardio: Reg rate Chest: normal effort, normal rate of breathing Abd: soft, non-distended Ext: no edema Psych: pleasant, normal affect Skin: intact Neuro: Alert and oriented x3       Assessment & Plan:   1) CVA: -discussed her recovery -continue aspirin  and plavix  -continue HEP RETURN TO DRIVING PLAN:  WITH THE SUPERVISION OF A LICENSED DRIVER, PLEASE DRIVE IN AN EMPTY PARKING LOT FOR AT LEAST 2-3 TRIALS TO TEST REACTION TIME, VISION, USE OF EQUIPMENT IN CAR, ETC.  IF SUCCESSFUL WITH THE PARKING LOT DRIVING, PROCEED TO SUPERVISED DRIVING TRIALS IN YOUR NEIGHBORHOOD STREETS AT LOW TRAFFIC TIMES TO TEST OBSERVATION TO TRAFFIC SIGNALS, REACTION TIME, ETC. PLEASE ATTEMPT AT LEAST 2-3 TRIALS IN YOUR NEIGHBORHOOD.  IF NEIGHBORHOOD DRIVING IS SUCCESSFUL, YOU MAY PROCEED TO DRIVING IN BUSIER AREAS IN YOUR COMMUNITY WITH SUPERVISION OF A LICENSED DRIVER. PLEASE ATTEMPT AT LEAST 4-5 TRIALS.  2) HTN: -continue Losartan   3) HLD:  -discussed her lipid panel  4) Cognitive impairment: -recommended Coenzyme Q10 -discussed that she started Prevagen  5) Polypharmacy -discussed that she is on Lyrica  which was started for itching  -discussed that she stopped diazepam -discussed that she takes Lyrica  only at night  6) Prediabetes: -discussed that she does not have to take metformin  -discussed her concerns regarding metformin  given her husband's kidney injury -recommended walking after meals  Current impairments:  Patient will require home OT and PT; orders have been placed  The patient's medical and/or psychosocial problems require moderate  decision-making during transitions in care from inpatient rehabilitation to home. This transitional care appointment included review of the patient's hospital discharge summary, review of the patient's hospital diagnostic tests and discussion of appropriate follow-up, education of the patient regarding their condition, re-establishment of necessary referrals. I will be reviewing patient's home and/or outpatient therapy notes as they progress through therapy and corresponding with therapists accordingly. I have encouraged compliance with current medication regimen (with adjustment to regimen as needed), follow-up with necessary providers, and the importance of following a healthy diet and exercise routine to maximize recovery, health, and quality of life.

## 2024-07-09 ENCOUNTER — Other Ambulatory Visit (HOSPITAL_COMMUNITY): Payer: Self-pay

## 2024-07-10 ENCOUNTER — Ambulatory Visit
Admission: RE | Admit: 2024-07-10 | Discharge: 2024-07-10 | Disposition: A | Discharge status: Home / Self Care | Source: Ambulatory Visit | Attending: Internal Medicine | Admitting: Internal Medicine

## 2024-07-10 DIAGNOSIS — Z1231 Encounter for screening mammogram for malignant neoplasm of breast: Secondary | ICD-10-CM | POA: Diagnosis present

## 2024-07-12 ENCOUNTER — Encounter: Payer: Self-pay | Admitting: Family Medicine

## 2024-07-16 ENCOUNTER — Other Ambulatory Visit: Payer: Self-pay | Admitting: Internal Medicine

## 2024-07-16 DIAGNOSIS — R928 Other abnormal and inconclusive findings on diagnostic imaging of breast: Secondary | ICD-10-CM

## 2024-07-17 ENCOUNTER — Ambulatory Visit
Admission: RE | Admit: 2024-07-17 | Discharge: 2024-07-17 | Disposition: A | Discharge status: Home / Self Care | Source: Ambulatory Visit | Attending: Internal Medicine | Admitting: Internal Medicine

## 2024-07-17 ENCOUNTER — Inpatient Hospital Stay
Admission: RE | Admit: 2024-07-17 | Discharge: 2024-07-17 | Discharge status: Home / Self Care | Source: Ambulatory Visit | Attending: Internal Medicine | Admitting: Internal Medicine

## 2024-07-17 DIAGNOSIS — R928 Other abnormal and inconclusive findings on diagnostic imaging of breast: Secondary | ICD-10-CM | POA: Diagnosis present

## 2024-07-18 ENCOUNTER — Other Ambulatory Visit: Payer: Self-pay | Admitting: Internal Medicine

## 2024-07-18 ENCOUNTER — Encounter: Payer: Self-pay | Admitting: Internal Medicine

## 2024-07-18 DIAGNOSIS — R928 Other abnormal and inconclusive findings on diagnostic imaging of breast: Secondary | ICD-10-CM

## 2024-07-24 ENCOUNTER — Ambulatory Visit
Admission: RE | Admit: 2024-07-24 | Discharge: 2024-07-24 | Disposition: A | Discharge status: Home / Self Care | Source: Ambulatory Visit | Attending: Internal Medicine | Admitting: Internal Medicine

## 2024-07-24 DIAGNOSIS — R928 Other abnormal and inconclusive findings on diagnostic imaging of breast: Secondary | ICD-10-CM | POA: Insufficient documentation

## 2024-07-24 DIAGNOSIS — N6325 Unspecified lump in the left breast, overlapping quadrants: Secondary | ICD-10-CM | POA: Diagnosis not present

## 2024-07-24 HISTORY — PX: BREAST BIOPSY: SHX20

## 2024-07-24 MED ORDER — LIDOCAINE 1 % OPTIME INJ - NO CHARGE
2.0000 mL | Freq: Once | INTRAMUSCULAR | Status: AC
  Administered 2024-07-24: 2 mL
  Filled 2024-07-24: qty 2

## 2024-07-24 MED ORDER — LIDOCAINE HCL 1 % IJ SOLN
2.0000 mL | Freq: Once | INTRAMUSCULAR | Status: AC
  Administered 2024-07-24: 2 mL

## 2024-07-24 MED ORDER — LIDOCAINE-EPINEPHRINE 1 %-1:100000 IJ SOLN
5.0000 mL | Freq: Once | INTRAMUSCULAR | Status: AC
Start: 2024-07-24 — End: 2024-07-24
  Administered 2024-07-24: 5 mL

## 2024-07-25 ENCOUNTER — Encounter: Payer: Self-pay | Admitting: *Deleted

## 2024-07-25 LAB — SURGICAL PATHOLOGY

## 2024-07-25 NOTE — Progress Notes (Signed)
 Received referral for newly diagnosed breast cancer from [facility].  Navigation initiated.  Left VM with son Alm asking for return call.

## 2024-07-26 ENCOUNTER — Encounter: Payer: Self-pay | Admitting: *Deleted

## 2024-07-26 NOTE — Progress Notes (Signed)
 Spoke with son Alm and gave a list of surgeons and medical oncologists.   He will discuss with [name] and call me next week with his decision.

## 2024-07-29 ENCOUNTER — Encounter: Payer: Self-pay | Admitting: *Deleted

## 2024-07-29 DIAGNOSIS — C50912 Malignant neoplasm of unspecified site of left female breast: Secondary | ICD-10-CM

## 2024-07-29 NOTE — Progress Notes (Signed)
 Ms. Bonebrake would like to see Dr. Jacobo and Dr. Tye.   She will see Dr. Jacobo on 9/10 at 11:30 and Dr. Tye 9/9 at 2:15.  Appt. Details given to her son Alm.

## 2024-07-30 ENCOUNTER — Ambulatory Visit: Payer: Self-pay | Admitting: Surgery

## 2024-07-30 NOTE — H&P (View-Only) (Signed)
 Subjective:   CC: Malignant neoplasm of central portion of left breast in female, estrogen receptor positive (CMS/HHS-HCC) [C50.112, Z17.0] HPI:  referred by Georgina for evaluation of above. Change was noted on last screening mammogram.   History of Present Illness    Past Medical History:  has a past medical history of Allergic rhinitis, Asthma without status asthmaticus (HHS-HCC), Depression, Elevated blood pressure, GERD (gastroesophageal reflux disease), Hyperlipidemia, and Restless legs.  Past Surgical History:  has no past surgical history on file.  Family History: family history includes Esophageal cancer in her father; High blood pressure (Hypertension) in her mother and sister; Lung cancer in her father; Other in her mother.  Social History:  reports that she has never smoked. She has never used smokeless tobacco. She reports that she does not drink alcohol and does not use drugs.  Current Medications: has a current medication list which includes the following prescription(s): acetaminophen , apoaequorin, atorvastatin , calcium  carbonate, cyanocobalamin (vitamin b-12), losartan , mv-min/iron/folic/calcium /vitk, pantoprazole , and polyethylene glycol.  Allergies:  Allergies as of 07/30/2024 - Reviewed 07/30/2024  Allergen Reaction Noted   Fenofibrate Unknown 10/23/2014   Latex Swelling 09/11/2018   Penicillin v potassium Unknown 10/23/2014   Septra [sulfamethoxazole-trimethoprim] Rash 08/16/2016   Statins-hmg-coa reductase inhibitors Abdominal Pain 10/23/2014   Tylox [oxycodone-acetaminophen ] Hives 10/23/2014   Ultram [tramadol] Swelling 10/23/2014    ROS:  A 15 point review of systems was performed and was negative except as noted in HPI   Objective:     BP 125/79   Pulse 86   Ht 157.5 cm (5' 2)   Wt 77.6 kg (171 lb)   BMI 31.28 kg/m   Constitutional :  No distress, cooperative, alert  Lymphatics/Throat:  Supple with no lymphadenopathy  Respiratory:  Clear to  auscultation bilaterally  Cardiovascular:  Regular rate and rhythm  Gastrointestinal: Soft, non-tender, non-distended, no organomegaly.  Musculoskeletal: Steady gait and movement  Skin: Cool and moist  Psychiatric: Normal affect, non-agitated, not confused  Breast: Normal appearance and no palpable abnormality in bilateral breasts and axilla.  Chaperone present for exam.      LABS:  SURGICAL PATHOLOGY SURGICAL PATHOLOGY [name] [address], [address] [place], [place] [address] Telephone [phone] or [phone] Fax [phone]  REPORT OF SURGICAL PATHOLOGY   Accession #: [id number] Patient Name: Anita Reilly, Anita Reilly Visit # : [id number]  MRN: [id number] Physician: Anice PONCE Rush DOB/Age [birth year] (Age: 78) Gender: F Collected Date: 07/24/2024 Received Date: 07/24/2024  FINAL DIAGNOSIS       1. Breast, left, needle core biopsy, 12 o'clock, 7cmfn, ribbon clip :      - INVASIVE DUCTAL CARCINOMA, SEE NOTE      - TUBULE FORMATION: SCORE 3      - NUCLEAR PLEOMORPHISM: SCORE 2      - MITOTIC COUNT: SCORE 1      - TOTAL SCORE: 6/9      - OVERALL GRADE: II/III      - LYMPHOVASCULAR INVASION: NOT IDENTIFIED      - CANCER LENGTH: 4 MM IN GREATEST LINEAR DIMENSION ON HEAVILY FRAGMENTED CORES      - CALCIFICATIONS: NOT IDENTIFIED      - OTHER FINDINGS: N/A      NOTE:      DR. REBBECCA REVIEWED THE CASE AND CONCURS WITH THE INTERPRETATION.  A BREAST      PROGNOSTIC PROFILE (ER, PR, KI-67 AND HER2) IS PENDING AND WILL BE REPORTED IN      AN ADDENDUM.  LINDA NASH WAS NOTIFIED ON 07/25/2024.       2. Lymph node, needle/core biopsy, left axilla, hydromark coil clip :      -  GRANULOMATOUS LYMPHADENITIS.      NOTE: THE BIOPSY CONSISTS OF HEAVILY FRAGMENTED CORES OF LYMPHOID TISSUE WITH      HISTIOCYTIC AGGREGATES CONSISTENT WITH NONCASEATING GRANULOMAS (SARCOIDAL LIKE).      SPECIAL STAINS (AFB, GMS AND PAS) ARE PENDING AND WILL BE REPORTED IN AN      ADDENDUM.   THERE IS NO EVIDENCE OF MALIGNANCY ON THE SECTIONS EXAMINED.       ELECTRONIC SIGNATURE : [place] Do, Mark, Pathologist, Electronic Signature  MICROSCOPIC DESCRIPTION  CASE COMMENTS STAINS USED IN DIAGNOSIS: H&E-2 H&E-3 H&E-4 H&E *RECUT 1 SLIDE H&E Stains used in diagnosis 1 Her2 by IHC, 1 ER-ACIS, 1 KI-67-ACIS, 1 PR-ACIS IHC scores are reported using ASCO/CAP scoring criteria.  An IHC Score of 0 or 1+  is NEGATIVE for HER2, 3+ is POSITIVE for HER2, and 2+ is EQUIVOCAL. Equivocal results are reflexed to either FISH or IHC testing. Specimens are fixed in 10% Neutral Buffered Formalin for at least 6 hours and up to 72 hours. These tests have not be validated on decalcified tissue.  Results should be interpreted with caution given the possibility of false negative results on decalcified specimens. Antibody Clone for HER2 is 4B5 (PATHWAY). Some of these immunohistochemical stains may have been developed and the performance characteristics determined by [facility].  Some may not have been cleared or approved by the U.S. Food and Drug Administration.  The FDA has determined that such clearance or approval is not necessary.  This test is used for clinical purposes.  It should not be regarded as investigational or for research.  This laboratory is certified under the Clinical Laboratory Improvement Amendments of 1988 (CLIA-88) as qualified to perform high complexity clinical laboratory testing. Estrogen receptor (6F11), immunohistochemical stains are performed on formalin fixed, paraffin embedded tissue using a 3,3-diaminobenzidine (DAB) chromogen and Leica Bond Autostainer System.  The staining intensity of the nucleus is scored manually and is reported as the percentage of tumor cell nuclei demonstrating specific nuclear staining.Specimens are fixed in 10% Neutral Buffered Formalin for at least 6 hours and up to 72 hours.  These tests have not be validated on  decalcified tissue.  Results should be interpreted with caution given the possibility of false negative results on decalcified specimens. Ki-67 (MM1), immunohistochemical stains are performed on formalin fixed, paraffin embedded tissue using a 3,3-diaminobenzidine (DAB) chromogen and Leica Bond Autostainer System.  The staining intensity of the nucleus is scored manually and is reported as the percentage of tumor cell nuclei demonstrating specific nuclear staining.Specimens are fixed in 10% Neutral Buffered Formalin for at least 6 hours and up to 72 hours. These tests have not be validated on decalcified tissue.  Results should be interpreted with caution given the possibility of false negative results on decalcified specimens. PR progesterone receptor (16), immunohistochemical stains are performed on formalin fixed, paraffin embedded tissue using a 3,3-diaminobenzidine (DAB) chromogen and Leica Bond Autostainer System.  The staining intensity of the nucleus is scored manually and is reported as the percentage of tumor cell nuclei demonstrating specific nuclear staining.Specimens are fixed in 10% Neutral Buffered Formalin for at least 6 hours and up to 72 hours. These tests have not be validated on decalcified tissue.  Results should be interpreted with caution given the possibility of false negative results on decalcified specimens.  ADDENDUM  Breast, left, needle core biopsy, 12 o'clock, 7cmfn, ribbon clip PROGNOSTIC INDICATORS  Results: IMMUNOHISTOCHEMICAL AND MORPHOMETRIC ANALYSIS PERFORMED MANUALLY The tumor cells are NEGATIVE for Her2 (1+). Estrogen Receptor:  100%, POSITIVE, STRONG STAINING INTENSITY Progesterone Receptor:  5%, POSITIVE, STRONG STAINING INTENSITY Proliferation Marker Ki67:  10% REFERENCE RANGE ESTROGEN RECEPTOR NEGATIVE     0% POSITIVE       =>1% REFERENCE RANGE PROGESTERONE RECEPTOR NEGATIVE     0% POSITIVE        =>1% All controls stained  appropriately [place] Md, Fred , Sports administrator, Electronic Signature ( Signed 09 05 2025)   CLINICAL HISTORY  SPECIMEN(S) OBTAINED 1. Breast, left, needle core biopsy, 12 O'clock, 7cmfn, Ribbon Clip 2. Lymph node, needle/core biopsy, Left Axilla, Hydromark Coil Clip  SPECIMEN COMMENTS: 1. TIF: 13:30 PM, CIT < 3 minutes; BR5 mass at 12:00, 5mm mass 2. TIF: 13:40 PM, CIT < 3 minutes; chronically enlarged nodes left axilla, prior biopsy 2022 of a diff.node with negative result SPECIMEN CLINICAL INFORMATION: 1. Expect malignancy, fat necrosis possible 2. Reactive vs lymphoproliferative disorder vs metastatic breast cancer    Gross Description 1. Received in formalin labeled with the patient's name and left breast 12 o'clock 7 cm fn are multiple fragments to cores of fibroadipose tissue ranging from 0.2 to 1.1 cm in length, each measuring 0.1 cm in diameter.The specimen is entirely submitted in one block.      Time in formalin 1330, CIT less than 3 minutes. 2. Received in formalin and fresh, each labeled left axillary lymph node are five fragments to cores of fibrofatty soft tissue ranging from 0.2 to 1.4 cm in length, each measuring 0.1 cm in diameter.A single fresh core is placed in RPMI for possible flow.The remaining specimen is submitted in one block.      Time in formalin 1340, CIT less than 3 minutes.   (KW:kh 07/24/24)        Report signed out from the following location(s) [place] [address] [place], [place] [address] CLIA #: [id number]  [facility] [address] [place], [place] [address] CLIA #: [id number]     RADS: CLINICAL DATA:  LEFT breast callback for calcifications and possible mass  EXAM: DIGITAL DIAGNOSTIC UNILATERAL LEFT MAMMOGRAM WITH TOMOSYNTHESIS AND CAD; ULTRASOUND LEFT BREAST LIMITED  TECHNIQUE: Left digital diagnostic mammography and breast tomosynthesis was performed. The images were  evaluated with computer-aided detection. ; Targeted ultrasound examination of the left breast was performed.  COMPARISON:  Previous exam(s).  ACR Breast Density Category c: The breasts are heterogeneously dense, which may obscure small masses.  FINDINGS: Spot compression magnification views confirm the presence of approximately 2 mm grouped benign dystrophic calcifications at 12 o'clock in the LEFT breast. These have been present since at least 2017 and have coarsened over time. They have minimally increased in number since 2017, possibly due to increased conspicuity as result of the general coarsening, and have not increased in extent over that time.  At the 12 o'clock position middle depth, spot compression views confirm a 5 mm round mass with indistinct margins, correlating with the area of concern on screening mammogram.  Anterior and just inferior to the mass is an area of subtle architectural distortion seen both on the CC and MLO views, with several regional oil cysts. The patient reportedly had trauma to this region in March of 2016 (per January 2017 mammogram report), although she is unable to confirm that history at this time. In retrospect,  this region appears similar dating back to at least April 2017 (for example CC slice 27/74 on that examination). Findings are favored to reflect distortion as a result of prior trauma, with long-term stability reassuring.  Targeted ultrasound of the LEFT breast was performed with a round 4 x 5 x 4 mm hypoechoic mass with indistinct margins at the 12 o'clock position 7 cm from the nipple, correlating with the mammographic mass. An oil cyst is noted regionally at 12 o'clock 4 cm from the nipple. There is vague shadowing without discrete suspicious mass in the region of chronic architectural distortion noted on the mammogram, also at 12 o'clock 4 cm from the nipple.  In the LEFT axilla there are multiple enlarged LEFT axillary  lymph nodes, in keeping with prior findings on April 27, 2021. Of note, a LEFT axillary lymph node was previously biopsied with benign results on May 06, 2021. Direct comparison of the lymph nodes on today's examination to the prior is challenging, with lymph nodes favored to be slightly increased in size compared to 2022, with maximum cortical thickness today measuring up to 10 mm, previously 8 mm. A comparison RIGHT axillary ultrasound was briefly performed and confirmed the presence of persistent RIGHT axillary lymphadenopathy as well.  IMPRESSION: 1. Suspicious 5 mm mass in the LEFT breast at 12 o'clock 7 cm from the nipple warrants further characterization with ultrasound-guided biopsy.  2. Enlarged LEFT axillary lymphadenopathy, stable to slightly increased from 2022 at which time it was biopsied with benign result, warrants repeat ultrasound-guided LEFT axillary biopsy to confirm benignity in the setting suspected ipsilateral breast cancer. Persistent RIGHT axillary lymphadenopathy was also noted on ultrasound performed for comparison sake. Recommend samples be sent in both formalin and saline so that flow cytometry can be performed.  3. Subtle architectural distortion and oil cysts in the superior LEFT breast in the region the highly suspicious LEFT breast mass. In retrospect the subtle distortion is likely unchanged dating back to April 2017, and the patient reportedly had trauma to this region in March of 2016, although she could not reconfirm that history today. Only subtle shadowing was noted in this region on targeted ultrasound, without definite discrete correlate. Given patient's difficulty with her last ultrasound-guided biopsy, including significant pain and likely vasovagal response, the decision was made to defer stereotactic biopsy pending results of the above recommended ultrasound-guided biopsies. If the above biopsy confirms LEFT breast malignancy, recommend  further characterization with stereotactic biopsy of the subtle distortion; alternatively, if there is plan for pretreatment MRI, biopsy could be deferred pending results of the MRI as lack of suspicious finding in this region, in conjunction with long-term stability of the distortion, would strongly suggest benignity.  4. LEFT breast calcifications described at the time of screening are similar in extent to 2017 and have coarsened over time, today demonstrating a dystrophic morphology consistent with a benign etiology.  RECOMMENDATION: 1.  Ultrasound-guided biopsy of the LEFT breast/axilla x2.  2. Deferred stereotactic biopsy (versus evaluation on pretreatment MRI) of likely posttraumatic, subtle LEFT breast distortion at 12 o'clock which is likely unchanged dating back to 2017, pending results of the above biopsy.  I have discussed the findings and recommendations with the patient. If applicable, a reminder letter will be sent to the patient regarding the next appointment.  BI-RADS CATEGORY  4: Suspicious.   Electronically Signed   By: Norleen Croak M.D.   On: 07/17/2024 20:33 Exam End: 07/17/24 15:48  CLINICAL DATA:  Suspicious LEFT breast mass  at 12 o'clock. Enlarged  LEFT axillary lymph node   EXAM:  3D DIAGNOSTIC LEFT MAMMOGRAM POST ULTRASOUND BIOPSY x2   COMPARISON:  Previous exam(s).   ACR Breast Density Category b: There are scattered areas of  fibroglandular density.   FINDINGS:  3D Mammographic images were obtained following ultrasound guided  biopsy of the LEFT breast mass at 12 o'clock and a LEFT axillary  lymph node.   Appropriate positioning of the ribbon-shaped biopsy marking clip at  the site of biopsy in the LEFT breast at 12 o'clock.   Appropriate positioning of the HydroMARK coil-shaped biopsy marking  clip at the site of biopsy in the LEFT axilla. Note that this clip  shaped had been previously used in an adjacent lymph node, although   morphological differences in shape and positioning make distinction  of the 2 nodes possible under real-time examination.   IMPRESSION:  Appropriate positioning of the biopsy marking clips at the sites of  biopsy in the LEFT breast and axilla.   Final Assessment: Post Procedure Mammograms for Marker Placement    Electronically Signed    By: Norleen Croak M.D.    On: 07/24/2024 14:21   Assessment:   Malignant neoplasm of central portion of left breast in female, estrogen receptor positive (CMS/HHS-HCC) [C50.112, Z17.0]  Plan:     1. Malignant neoplasm of central portion of left breast in female, estrogen receptor positive (CMS/HHS-HCC) [C50.112, Z17.0]  Discussed the risk of surgery including recurrence, chronic pain, post-op infxn, poor/delayed wound healing, poor cosmesis, seroma, hematoma formation, and possible re-operation to address said risks. The risks of general anesthetic, if used, includes MI, CVA, sudden death or even reaction to anesthetic medications also discussed.  Typical post-op recovery time and possbility of activity restrictions were also discussed.  Alternatives include continued observation.  Benefits include possible symptom relief, pathologic evaluation, and/or curative excision.   The patient verbalized understanding and all questions were answered to the patient's satisfaction.  2. Patient has elected to proceed with surgical treatment. Procedure will be scheduled.    Will proceed with left breast lumpectomy, sentinel lymph node biopsy.  We will remove the additional area of concern noted on mammogram at the same time we remove the known cancer tumor.  SCOUT tag and tracer injection needed prior to surgery  labs/images/medications/previous chart entries reviewed personally and relevant changes/updates noted above.

## 2024-07-30 NOTE — H&P (Addendum)
 Subjective:   CC: Malignant neoplasm of central portion of left breast in female, estrogen receptor positive (CMS/HHS-HCC) [C50.112, Z17.0] HPI:  referred by Georgina for evaluation of above. Change was noted on last screening mammogram.   History of Present Illness    Past Medical History:  has a past medical history of Allergic rhinitis, Asthma without status asthmaticus (HHS-HCC), Depression, Elevated blood pressure, GERD (gastroesophageal reflux disease), Hyperlipidemia, and Restless legs.  Past Surgical History:  has no past surgical history on file.  Family History: family history includes Esophageal cancer in her father; High blood pressure (Hypertension) in her mother and sister; Lung cancer in her father; Other in her mother.  Social History:  reports that she has never smoked. She has never used smokeless tobacco. She reports that she does not drink alcohol and does not use drugs.  Current Medications: has a current medication list which includes the following prescription(s): acetaminophen , apoaequorin, atorvastatin , calcium  carbonate, cyanocobalamin (vitamin b-12), losartan , mv-min/iron/folic/calcium /vitk, pantoprazole , and polyethylene glycol.  Allergies:  Allergies as of 07/30/2024 - Reviewed 07/30/2024  Allergen Reaction Noted   Fenofibrate Unknown 10/23/2014   Latex Swelling 09/11/2018   Penicillin v potassium Unknown 10/23/2014   Septra [sulfamethoxazole-trimethoprim] Rash 08/16/2016   Statins-hmg-coa reductase inhibitors Abdominal Pain 10/23/2014   Tylox [oxycodone-acetaminophen ] Hives 10/23/2014   Ultram [tramadol] Swelling 10/23/2014    ROS:  A 15 point review of systems was performed and was negative except as noted in HPI   Objective:     BP 125/79   Pulse 86   Ht 157.5 cm (5' 2)   Wt 77.6 kg (171 lb)   BMI 31.28 kg/m   Constitutional :  No distress, cooperative, alert  Lymphatics/Throat:  Supple with no lymphadenopathy  Respiratory:  Clear to  auscultation bilaterally  Cardiovascular:  Regular rate and rhythm  Gastrointestinal: Soft, non-tender, non-distended, no organomegaly.  Musculoskeletal: Steady gait and movement  Skin: Cool and moist  Psychiatric: Normal affect, non-agitated, not confused  Breast: Normal appearance and no palpable abnormality in bilateral breasts and axilla.  Chaperone present for exam.      LABS:  SURGICAL PATHOLOGY SURGICAL PATHOLOGY [name] [address], [address] [place], [place] [address] Telephone [phone] or [phone] Fax [phone]  REPORT OF SURGICAL PATHOLOGY   Accession #: [id number] Patient Name: Anita Reilly, Anita Reilly Visit # : [id number]  MRN: [id number] Physician: Anice PONCE Rush DOB/Age [birth year] (Age: 78) Gender: F Collected Date: 07/24/2024 Received Date: 07/24/2024  FINAL DIAGNOSIS       1. Breast, left, needle core biopsy, 12 o'clock, 7cmfn, ribbon clip :      - INVASIVE DUCTAL CARCINOMA, SEE NOTE      - TUBULE FORMATION: SCORE 3      - NUCLEAR PLEOMORPHISM: SCORE 2      - MITOTIC COUNT: SCORE 1      - TOTAL SCORE: 6/9      - OVERALL GRADE: II/III      - LYMPHOVASCULAR INVASION: NOT IDENTIFIED      - CANCER LENGTH: 4 MM IN GREATEST LINEAR DIMENSION ON HEAVILY FRAGMENTED CORES      - CALCIFICATIONS: NOT IDENTIFIED      - OTHER FINDINGS: N/A      NOTE:      DR. REBBECCA REVIEWED THE CASE AND CONCURS WITH THE INTERPRETATION.  A BREAST      PROGNOSTIC PROFILE (ER, PR, KI-67 AND HER2) IS PENDING AND WILL BE REPORTED IN      AN ADDENDUM.  LINDA NASH WAS NOTIFIED ON 07/25/2024.       2. Lymph node, needle/core biopsy, left axilla, hydromark coil clip :      -  GRANULOMATOUS LYMPHADENITIS.      NOTE: THE BIOPSY CONSISTS OF HEAVILY FRAGMENTED CORES OF LYMPHOID TISSUE WITH      HISTIOCYTIC AGGREGATES CONSISTENT WITH NONCASEATING GRANULOMAS (SARCOIDAL LIKE).      SPECIAL STAINS (AFB, GMS AND PAS) ARE PENDING AND WILL BE REPORTED IN AN      ADDENDUM.   THERE IS NO EVIDENCE OF MALIGNANCY ON THE SECTIONS EXAMINED.       ELECTRONIC SIGNATURE : [place] Do, Mark, Pathologist, Electronic Signature  MICROSCOPIC DESCRIPTION  CASE COMMENTS STAINS USED IN DIAGNOSIS: H&E-2 H&E-3 H&E-4 H&E *RECUT 1 SLIDE H&E Stains used in diagnosis 1 Her2 by IHC, 1 ER-ACIS, 1 KI-67-ACIS, 1 PR-ACIS IHC scores are reported using ASCO/CAP scoring criteria.  An IHC Score of 0 or 1+  is NEGATIVE for HER2, 3+ is POSITIVE for HER2, and 2+ is EQUIVOCAL. Equivocal results are reflexed to either FISH or IHC testing. Specimens are fixed in 10% Neutral Buffered Formalin for at least 6 hours and up to 72 hours. These tests have not be validated on decalcified tissue.  Results should be interpreted with caution given the possibility of false negative results on decalcified specimens. Antibody Clone for HER2 is 4B5 (PATHWAY). Some of these immunohistochemical stains may have been developed and the performance characteristics determined by [facility].  Some may not have been cleared or approved by the U.S. Food and Drug Administration.  The FDA has determined that such clearance or approval is not necessary.  This test is used for clinical purposes.  It should not be regarded as investigational or for research.  This laboratory is certified under the Clinical Laboratory Improvement Amendments of 1988 (CLIA-88) as qualified to perform high complexity clinical laboratory testing. Estrogen receptor (6F11), immunohistochemical stains are performed on formalin fixed, paraffin embedded tissue using a 3,3-diaminobenzidine (DAB) chromogen and Leica Bond Autostainer System.  The staining intensity of the nucleus is scored manually and is reported as the percentage of tumor cell nuclei demonstrating specific nuclear staining.Specimens are fixed in 10% Neutral Buffered Formalin for at least 6 hours and up to 72 hours.  These tests have not be validated on  decalcified tissue.  Results should be interpreted with caution given the possibility of false negative results on decalcified specimens. Ki-67 (MM1), immunohistochemical stains are performed on formalin fixed, paraffin embedded tissue using a 3,3-diaminobenzidine (DAB) chromogen and Leica Bond Autostainer System.  The staining intensity of the nucleus is scored manually and is reported as the percentage of tumor cell nuclei demonstrating specific nuclear staining.Specimens are fixed in 10% Neutral Buffered Formalin for at least 6 hours and up to 72 hours. These tests have not be validated on decalcified tissue.  Results should be interpreted with caution given the possibility of false negative results on decalcified specimens. PR progesterone receptor (16), immunohistochemical stains are performed on formalin fixed, paraffin embedded tissue using a 3,3-diaminobenzidine (DAB) chromogen and Leica Bond Autostainer System.  The staining intensity of the nucleus is scored manually and is reported as the percentage of tumor cell nuclei demonstrating specific nuclear staining.Specimens are fixed in 10% Neutral Buffered Formalin for at least 6 hours and up to 72 hours. These tests have not be validated on decalcified tissue.  Results should be interpreted with caution given the possibility of false negative results on decalcified specimens.  ADDENDUM  Breast, left, needle core biopsy, 12 o'clock, 7cmfn, ribbon clip PROGNOSTIC INDICATORS  Results: IMMUNOHISTOCHEMICAL AND MORPHOMETRIC ANALYSIS PERFORMED MANUALLY The tumor cells are NEGATIVE for Her2 (1+). Estrogen Receptor:  100%, POSITIVE, STRONG STAINING INTENSITY Progesterone Receptor:  5%, POSITIVE, STRONG STAINING INTENSITY Proliferation Marker Ki67:  10% REFERENCE RANGE ESTROGEN RECEPTOR NEGATIVE     0% POSITIVE       =>1% REFERENCE RANGE PROGESTERONE RECEPTOR NEGATIVE     0% POSITIVE        =>1% All controls stained  appropriately [place] Md, Fred , Sports administrator, Electronic Signature ( Signed 09 05 2025)   CLINICAL HISTORY  SPECIMEN(S) OBTAINED 1. Breast, left, needle core biopsy, 12 O'clock, 7cmfn, Ribbon Clip 2. Lymph node, needle/core biopsy, Left Axilla, Hydromark Coil Clip  SPECIMEN COMMENTS: 1. TIF: 13:30 PM, CIT < 3 minutes; BR5 mass at 12:00, 5mm mass 2. TIF: 13:40 PM, CIT < 3 minutes; chronically enlarged nodes left axilla, prior biopsy 2022 of a diff.node with negative result SPECIMEN CLINICAL INFORMATION: 1. Expect malignancy, fat necrosis possible 2. Reactive vs lymphoproliferative disorder vs metastatic breast cancer    Gross Description 1. Received in formalin labeled with the patient's name and left breast 12 o'clock 7 cm fn are multiple fragments to cores of fibroadipose tissue ranging from 0.2 to 1.1 cm in length, each measuring 0.1 cm in diameter.The specimen is entirely submitted in one block.      Time in formalin 1330, CIT less than 3 minutes. 2. Received in formalin and fresh, each labeled left axillary lymph node are five fragments to cores of fibrofatty soft tissue ranging from 0.2 to 1.4 cm in length, each measuring 0.1 cm in diameter.A single fresh core is placed in RPMI for possible flow.The remaining specimen is submitted in one block.      Time in formalin 1340, CIT less than 3 minutes.   (KW:kh 07/24/24)        Report signed out from the following location(s) [place] [address] [place], [place] [address] CLIA #: [id number]  [facility] [address] [place], [place] [address] CLIA #: [id number]     RADS: CLINICAL DATA:  LEFT breast callback for calcifications and possible mass  EXAM: DIGITAL DIAGNOSTIC UNILATERAL LEFT MAMMOGRAM WITH TOMOSYNTHESIS AND CAD; ULTRASOUND LEFT BREAST LIMITED  TECHNIQUE: Left digital diagnostic mammography and breast tomosynthesis was performed. The images were  evaluated with computer-aided detection. ; Targeted ultrasound examination of the left breast was performed.  COMPARISON:  Previous exam(s).  ACR Breast Density Category c: The breasts are heterogeneously dense, which may obscure small masses.  FINDINGS: Spot compression magnification views confirm the presence of approximately 2 mm grouped benign dystrophic calcifications at 12 o'clock in the LEFT breast. These have been present since at least 2017 and have coarsened over time. They have minimally increased in number since 2017, possibly due to increased conspicuity as result of the general coarsening, and have not increased in extent over that time.  At the 12 o'clock position middle depth, spot compression views confirm a 5 mm round mass with indistinct margins, correlating with the area of concern on screening mammogram.  Anterior and just inferior to the mass is an area of subtle architectural distortion seen both on the CC and MLO views, with several regional oil cysts. The patient reportedly had trauma to this region in March of 2016 (per January 2017 mammogram report), although she is unable to confirm that history at this time. In retrospect,  this region appears similar dating back to at least April 2017 (for example CC slice 27/74 on that examination). Findings are favored to reflect distortion as a result of prior trauma, with long-term stability reassuring.  Targeted ultrasound of the LEFT breast was performed with a round 4 x 5 x 4 mm hypoechoic mass with indistinct margins at the 12 o'clock position 7 cm from the nipple, correlating with the mammographic mass. An oil cyst is noted regionally at 12 o'clock 4 cm from the nipple. There is vague shadowing without discrete suspicious mass in the region of chronic architectural distortion noted on the mammogram, also at 12 o'clock 4 cm from the nipple.  In the LEFT axilla there are multiple enlarged LEFT axillary  lymph nodes, in keeping with prior findings on April 27, 2021. Of note, a LEFT axillary lymph node was previously biopsied with benign results on May 06, 2021. Direct comparison of the lymph nodes on today's examination to the prior is challenging, with lymph nodes favored to be slightly increased in size compared to 2022, with maximum cortical thickness today measuring up to 10 mm, previously 8 mm. A comparison RIGHT axillary ultrasound was briefly performed and confirmed the presence of persistent RIGHT axillary lymphadenopathy as well.  IMPRESSION: 1. Suspicious 5 mm mass in the LEFT breast at 12 o'clock 7 cm from the nipple warrants further characterization with ultrasound-guided biopsy.  2. Enlarged LEFT axillary lymphadenopathy, stable to slightly increased from 2022 at which time it was biopsied with benign result, warrants repeat ultrasound-guided LEFT axillary biopsy to confirm benignity in the setting suspected ipsilateral breast cancer. Persistent RIGHT axillary lymphadenopathy was also noted on ultrasound performed for comparison sake. Recommend samples be sent in both formalin and saline so that flow cytometry can be performed.  3. Subtle architectural distortion and oil cysts in the superior LEFT breast in the region the highly suspicious LEFT breast mass. In retrospect the subtle distortion is likely unchanged dating back to April 2017, and the patient reportedly had trauma to this region in March of 2016, although she could not reconfirm that history today. Only subtle shadowing was noted in this region on targeted ultrasound, without definite discrete correlate. Given patient's difficulty with her last ultrasound-guided biopsy, including significant pain and likely vasovagal response, the decision was made to defer stereotactic biopsy pending results of the above recommended ultrasound-guided biopsies. If the above biopsy confirms LEFT breast malignancy, recommend  further characterization with stereotactic biopsy of the subtle distortion; alternatively, if there is plan for pretreatment MRI, biopsy could be deferred pending results of the MRI as lack of suspicious finding in this region, in conjunction with long-term stability of the distortion, would strongly suggest benignity.  4. LEFT breast calcifications described at the time of screening are similar in extent to 2017 and have coarsened over time, today demonstrating a dystrophic morphology consistent with a benign etiology.  RECOMMENDATION: 1.  Ultrasound-guided biopsy of the LEFT breast/axilla x2.  2. Deferred stereotactic biopsy (versus evaluation on pretreatment MRI) of likely posttraumatic, subtle LEFT breast distortion at 12 o'clock which is likely unchanged dating back to 2017, pending results of the above biopsy.  I have discussed the findings and recommendations with the patient. If applicable, a reminder letter will be sent to the patient regarding the next appointment.  BI-RADS CATEGORY  4: Suspicious.   Electronically Signed   By: Norleen Croak M.D.   On: 07/17/2024 20:33 Exam End: 07/17/24 15:48  CLINICAL DATA:  Suspicious LEFT breast mass  at 12 o'clock. Enlarged  LEFT axillary lymph node   EXAM:  3D DIAGNOSTIC LEFT MAMMOGRAM POST ULTRASOUND BIOPSY x2   COMPARISON:  Previous exam(s).   ACR Breast Density Category b: There are scattered areas of  fibroglandular density.   FINDINGS:  3D Mammographic images were obtained following ultrasound guided  biopsy of the LEFT breast mass at 12 o'clock and a LEFT axillary  lymph node.   Appropriate positioning of the ribbon-shaped biopsy marking clip at  the site of biopsy in the LEFT breast at 12 o'clock.   Appropriate positioning of the HydroMARK coil-shaped biopsy marking  clip at the site of biopsy in the LEFT axilla. Note that this clip  shaped had been previously used in an adjacent lymph node, although   morphological differences in shape and positioning make distinction  of the 2 nodes possible under real-time examination.   IMPRESSION:  Appropriate positioning of the biopsy marking clips at the sites of  biopsy in the LEFT breast and axilla.   Final Assessment: Post Procedure Mammograms for Marker Placement    Electronically Signed    By: Norleen Croak M.D.    On: 07/24/2024 14:21   Assessment:   Malignant neoplasm of central portion of left breast in female, estrogen receptor positive (CMS/HHS-HCC) [C50.112, Z17.0]  Plan:     1. Malignant neoplasm of central portion of left breast in female, estrogen receptor positive (CMS/HHS-HCC) [C50.112, Z17.0]  Discussed the risk of surgery including recurrence, chronic pain, post-op infxn, poor/delayed wound healing, poor cosmesis, seroma, hematoma formation, and possible re-operation to address said risks. The risks of general anesthetic, if used, includes MI, CVA, sudden death or even reaction to anesthetic medications also discussed.  Typical post-op recovery time and possbility of activity restrictions were also discussed.  Alternatives include continued observation.  Benefits include possible symptom relief, pathologic evaluation, and/or curative excision.   The patient verbalized understanding and all questions were answered to the patient's satisfaction.  2. Patient has elected to proceed with surgical treatment. Procedure will be scheduled.    Will proceed with left breast lumpectomy, sentinel lymph node biopsy.  We will remove the additional area of concern noted on mammogram at the same time we remove the known cancer tumor.  SCOUT tag and tracer injection needed prior to surgery  labs/images/medications/previous chart entries reviewed personally and relevant changes/updates noted above.

## 2024-07-31 ENCOUNTER — Encounter: Payer: Self-pay | Admitting: Oncology

## 2024-07-31 ENCOUNTER — Inpatient Hospital Stay

## 2024-07-31 ENCOUNTER — Other Ambulatory Visit: Payer: Self-pay | Admitting: Surgery

## 2024-07-31 ENCOUNTER — Encounter: Payer: Self-pay | Admitting: *Deleted

## 2024-07-31 ENCOUNTER — Inpatient Hospital Stay: Attending: Oncology | Admitting: Oncology

## 2024-07-31 VITALS — Temp 97.0°F | Resp 18 | Ht 64.0 in | Wt 175.0 lb

## 2024-07-31 DIAGNOSIS — C50912 Malignant neoplasm of unspecified site of left female breast: Secondary | ICD-10-CM

## 2024-07-31 DIAGNOSIS — C50812 Malignant neoplasm of overlapping sites of left female breast: Secondary | ICD-10-CM | POA: Diagnosis present

## 2024-07-31 DIAGNOSIS — Z1732 Human epidermal growth factor receptor 2 negative status: Secondary | ICD-10-CM | POA: Insufficient documentation

## 2024-07-31 DIAGNOSIS — Z170 Estrogen receptor positive status [ER+]: Secondary | ICD-10-CM | POA: Insufficient documentation

## 2024-07-31 DIAGNOSIS — Z1721 Progesterone receptor positive status: Secondary | ICD-10-CM | POA: Insufficient documentation

## 2024-07-31 HISTORY — DX: Malignant neoplasm of unspecified site of left female breast: C50.912

## 2024-07-31 NOTE — Progress Notes (Signed)
 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  INTERVAL HISTORY: Patient is an 78 year old female who was noted to have an abnormality on her routine yearly screening mammogram.  Subsequent ultrasound biopsy revealed the above-stated malignancy.  She currently feels well and is asymptomatic.  She has no neurologic complaints.  She denies any recent fevers or illnesses.  She has a good appetite and denies weight loss.  She has no chest pain, shortness of breath, cough, or hemoptysis.  She denies any nausea, vomiting, constipation, or diarrhea.  She has no urinary complaints.  Patient offers no specific complaints today.  REVIEW OF SYSTEMS:   Review of Systems  Constitutional: Negative.  Negative for fever, malaise/fatigue and weight loss.  Respiratory: Negative.  Negative for cough, hemoptysis and shortness of breath.   Cardiovascular: Negative.  Negative for chest pain and leg swelling.  Gastrointestinal: Negative.  Negative for abdominal pain.  Genitourinary: Negative.  Negative for dysuria.  Musculoskeletal: Negative.  Negative for back pain.  Skin: Negative.  Negative for rash.  Neurological: Negative.  Negative for dizziness, focal weakness, weakness and headaches.  Psychiatric/Behavioral: Negative.  The patient is not nervous/anxious.     As per HPI. Otherwise, a complete review of systems is negative.  PAST MEDICAL HISTORY: Past Medical History:  Diagnosis Date   Arthritis    shoulders   Bell's palsy 2016   Vertigo    none in several years    PAST SURGICAL HISTORY: Past Surgical History:  Procedure Laterality Date   BREAST BIOPSY  Left 05/06/2021   u/s bx, hydromark, path pending   BREAST BIOPSY Left 07/24/2024   US  LT BREAST BX W LOC DEV 1ST LESION IMG BX SPEC US  GUIDE 07/24/2024 [facility]MOGRAPHY   CARPAL TUNNEL RELEASE     left    CATARACT EXTRACTION W/PHACO Right 09/19/2018   Procedure: CATARACT EXTRACTION PHACO AND INTRAOCULAR LENS PLACEMENT (IOC) RIGHT;  Surgeon: Mittie Gaskin, MD;  Location: [facility];  Service: Ophthalmology;  Laterality: Right;  latex sensitivity   CATARACT EXTRACTION W/PHACO Left 10/10/2018   Procedure: CATARACT EXTRACTION PHACO AND INTRAOCULAR LENS PLACEMENT (IOC)  LEFT;  Surgeon: Mittie Gaskin, MD;  Location: [facility];  Service: Ophthalmology;  Laterality: Left;  Latex sensitivity requests arrival around 10 or 10:30   COLONOSCOPY      FAMILY HISTORY: Family History  Problem Relation Age of Onset   Breast cancer Neg Hx     ADVANCED DIRECTIVES (Y/N):  N  HEALTH MAINTENANCE: Social History   Tobacco Use   Smoking status: Never   Smokeless tobacco: Never  Vaping Use   Vaping status: Never Used  Substance Use Topics   Alcohol use: No   Drug use: No     Colonoscopy:  PAP:  Bone density:  Lipid panel:  Allergies  Allergen Reactions   Latex     Dental gloves caused lip swelling   Sulfa Antibiotics Swelling    lips   Tylox [Oxycodone-Acetaminophen ]    Penicillins Rash    Veetids caused rash    Current Outpatient Medications  Medication Sig Dispense Refill   acetaminophen  (TYLENOL ) 325 MG tablet Take 2 tablets (650 mg total) by mouth every 4 (four)  hours as needed for mild pain (pain score 1-3) or fever (or temp > 37.5 C (99.5 F)).     atorvastatin  (LIPITOR ) 40 MG tablet Take 1 tablet (40 mg total) by mouth at bedtime. 30 tablet 0   Calcium  Carbonate-Vitamin D (CALTRATE 600+D PO) Take 1 tablet by mouth daily.     losartan  (COZAAR ) 50 MG tablet Take 1 tablet (50 mg total) by mouth daily. 30 tablet 0   pantoprazole  (PROTONIX ) 40 MG tablet  Take 1 tablet (40 mg total) by mouth daily. 30 tablet 0   polyethylene glycol (MIRALAX  / GLYCOLAX ) 17 g packet Take 17 g by mouth daily as needed.     pregabalin  (LYRICA ) 25 MG capsule Take 1 capsule (25 mg total) by mouth 2 (two) times daily. 60 capsule 0   No current facility-administered medications for this visit.    OBJECTIVE: Vitals:   07/31/24 1124  Resp: 18  Temp: (!) 97 F (36.1 C)  SpO2: 97%     Body mass index is 30.04 kg/m.    ECOG FS:0 - Asymptomatic  General: Well-developed, well-nourished, no acute distress. Eyes: Pink conjunctiva, anicteric sclera. HEENT: Normocephalic, moist mucous membranes. Lungs: No audible wheezing or coughing. Heart: Regular rate and rhythm. Abdomen: Soft, nontender, no obvious distention. Musculoskeletal: No edema, cyanosis, or clubbing. Neuro: Alert, answering all questions appropriately. Cranial nerves grossly intact. Skin: No rashes or petechiae noted. Psych: Normal affect. Lymphatics: No cervical, calvicular, axillary or inguinal LAD.   LAB RESULTS:  Lab Results  Component Value Date   NA 136 05/06/2024   K 4.4 05/06/2024   CL 102 05/06/2024   CO2 24 05/06/2024   GLUCOSE 100 (H) 05/06/2024   BUN 18 05/06/2024   CREATININE 1.05 (H) 05/06/2024   CALCIUM  9.0 05/06/2024   PROT 6.9 05/06/2024   ALBUMIN 3.4 (L) 05/06/2024   AST 22 05/06/2024   ALT 12 05/06/2024   ALKPHOS 107 05/06/2024   BILITOT 0.3 05/06/2024   GFRNONAA 54 (L) 05/06/2024   GFRAA >90 08/16/2012    Lab Results  Component Value Date   WBC 5.4 05/06/2024   NEUTROABS 2.4 05/06/2024   HGB 13.8 05/06/2024   HCT 40.0 05/06/2024   MCV 92.4 05/06/2024   PLT 360 05/06/2024     STUDIES: US  AXILLARY NODE CORE BIOPSY LEFT Addendum Date: 07/26/2024 ADDENDUM REPORT: 07/26/2024 08:48 ADDENDUM: PATHOLOGY revealed: Site 1. Breast, left, needle core biopsy, 12 o'clock, 7 cmfn, ribbon clip - INVASIVE DUCTAL CARCINOMA - OVERALL GRADE: II/III - LYMPHOVASCULAR INVASION:  NOT IDENTIFIED - CANCER LENGTH: 4 MM IN GREATEST LINEAR DIMENSION ON HEAVILY FRAGMENTED CORES - CALCIFICATIONS: NOT IDENTIFIED. Pathology results are CONCORDANT with imaging findings, per Norleen Croak M.D. PATHOLOGY revealed: Site 2. Lymph node, needle/core biopsy, left axilla, hydromark coil clip - GRANULOMATOUS LYMPHADENITIS. NOTE: THE BIOPSY CONSISTS OF HEAVILY FRAGMENTED CORES OF LYMPHOID TISSUE WITH HISTIOCYTIC AGGREGATES CONSISTENT WITH NONCASEATING GRANULOMAS (SARCOIDAL LIKE). SPECIAL STAINS (AFB, GMS AND PAS) ARE PENDING AND WILL BE REPORTED IN AN ADDENDUM. THERE IS NO EVIDENCE OF MALIGNANCY ON THE SECTIONS EXAMINED. Pathology results are CONCORDANT with imaging findings, per Norleen Croak M.D. Pathology results and recommendations below were discussed with patient and her son Charolotte) by telephone on 07/25/2024 by Rock Hover RN. Patient reported biopsy site within normal limits with slight tenderness at the site. Post biopsy care instructions were reviewed, questions were answered and my direct phone number was provided to patient. Patient was instructed to call [facility] if any concerns or questions arise  related to the biopsy. RECOMMENDATIONS: 1. Surgical and oncological consultation. Request for surgical and oncological consultation relayed to Shasta Ada RN at [facility] by Rock Hover RN on 07/25/2024. 2. If pretreatment bilateral breast MRI is planned, then LEFT breast stereotactic guided biopsy of subtle distortion can be deferred pending results of MRI. If MRI is not pursued, patient will need a LEFT breast stereotactic guided biopsy of the LEFT breast distortion (which is favored to be stable/benign from remote trauma). 3. 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 Pathology results reported by Rock Hover RN on 07/25/2024. Electronically Signed   By: Norleen Croak M.D.   On: 07/26/2024 08:48   Result Date: 07/26/2024 CLINICAL DATA:   Highly suspicious LEFT breast mass, chronic LEFT axillary lymphadenopathy. EXAM: ULTRASOUND GUIDED CORE NEEDLE BIOPSY LEFT BREAST x 1; US  AXILLARY NODE CORE BIOPSY LEFT x 1 COMPARISON:  Previous exam(s). PROCEDURE: I met with the patient and we discussed the procedure of ultrasound-guided biopsy, including benefits and alternatives. We discussed the high likelihood of a successful procedure. We discussed the risks of the procedure, including infection, bleeding, tissue injury, clip migration, and inadequate sampling. Informed written consent was given. The usual time-out protocol was performed immediately prior to the procedure. Site 1: LEFT breast 12 o'clock mass 7 cm from the nipple, ribbon clip Using sterile technique and 1% lidocaine  and 1% lidocaine  with epinephrine  as local anesthetic, under direct ultrasound visualization, a 14 gauge spring-loaded device was used to perform biopsy of the LEFT breast mass at 12 o'clock 7 cm from the nipple using a radial approach. At the conclusion of the procedure ribbon tissue marker clip was deployed into the biopsy cavity. Site 2: LEFT breast axillary lymph node, HydroMARK coil clip Using sterile technique and 1% Lidocaine  as local anesthetic, under direct ultrasound visualization, a 14 gauge spring-loaded device was used to perform biopsy of a LEFT axillary lymph node using a anti radial approach. At the conclusion of the procedure [facility] coil tissue marker clip was deployed into the biopsy cavity. Follow up 2 view mammogram was performed and dictated separately. IMPRESSION: Ultrasound guided biopsy of a LEFT breast mass at 12 o'clock and enlarged LEFT axillary lymph node. No apparent complications. Electronically Signed: By: Norleen Croak M.D. On: 07/24/2024 14:19   US  LT BREAST BX W LOC DEV 1ST LESION IMG BX SPEC US  GUIDE Addendum Date: 07/26/2024 ADDENDUM REPORT: 07/26/2024 08:48 ADDENDUM: PATHOLOGY revealed: Site 1. Breast, left, needle core biopsy, 12 o'clock, 7  cmfn, ribbon clip - INVASIVE DUCTAL CARCINOMA - OVERALL GRADE: II/III - LYMPHOVASCULAR INVASION: NOT IDENTIFIED - CANCER LENGTH: 4 MM IN GREATEST LINEAR DIMENSION ON HEAVILY FRAGMENTED CORES - CALCIFICATIONS: NOT IDENTIFIED. Pathology results are CONCORDANT with imaging findings, per Norleen Croak M.D. PATHOLOGY revealed: Site 2. Lymph node, needle/core biopsy, left axilla, hydromark coil clip - GRANULOMATOUS LYMPHADENITIS. NOTE: THE BIOPSY CONSISTS OF HEAVILY FRAGMENTED CORES OF LYMPHOID TISSUE WITH HISTIOCYTIC AGGREGATES CONSISTENT WITH NONCASEATING GRANULOMAS (SARCOIDAL LIKE). SPECIAL STAINS (AFB, GMS AND PAS) ARE PENDING AND WILL BE REPORTED IN AN ADDENDUM. THERE IS NO EVIDENCE OF MALIGNANCY ON THE SECTIONS EXAMINED. Pathology results are CONCORDANT with imaging findings, per Norleen Croak M.D. Pathology results and recommendations below were discussed with patient and her son Charolotte) by telephone on 07/25/2024 by Rock Hover RN. Patient reported biopsy site within normal limits with slight tenderness at the site. Post biopsy care instructions were reviewed, questions were answered and my direct phone number was provided  to patient. Patient was instructed to call [facility] if any concerns or questions arise related to the biopsy. RECOMMENDATIONS: 1. Surgical and oncological consultation. Request for surgical and oncological consultation relayed to Shasta Ada RN at [facility] by Rock Hover RN on 07/25/2024. 2. If pretreatment bilateral breast MRI is planned, then LEFT breast stereotactic guided biopsy of subtle distortion can be deferred pending results of MRI. If MRI is not pursued, patient will need a LEFT breast stereotactic guided biopsy of the LEFT breast distortion (which is favored to be stable/benign from remote trauma). 3. 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 Pathology results reported by Rock Hover RN on 07/25/2024. Electronically  Signed   By: Norleen Croak M.D.   On: 07/26/2024 08:48   Result Date: 07/26/2024 CLINICAL DATA:  Highly suspicious LEFT breast mass, chronic LEFT axillary lymphadenopathy. EXAM: ULTRASOUND GUIDED CORE NEEDLE BIOPSY LEFT BREAST x 1; US  AXILLARY NODE CORE BIOPSY LEFT x 1 COMPARISON:  Previous exam(s). PROCEDURE: I met with the patient and we discussed the procedure of ultrasound-guided biopsy, including benefits and alternatives. We discussed the high likelihood of a successful procedure. We discussed the risks of the procedure, including infection, bleeding, tissue injury, clip migration, and inadequate sampling. Informed written consent was given. The usual time-out protocol was performed immediately prior to the procedure. Site 1: LEFT breast 12 o'clock mass 7 cm from the nipple, ribbon clip Using sterile technique and 1% lidocaine  and 1% lidocaine  with epinephrine  as local anesthetic, under direct ultrasound visualization, a 14 gauge spring-loaded device was used to perform biopsy of the LEFT breast mass at 12 o'clock 7 cm from the nipple using a radial approach. At the conclusion of the procedure ribbon tissue marker clip was deployed into the biopsy cavity. Site 2: LEFT breast axillary lymph node, HydroMARK coil clip Using sterile technique and 1% Lidocaine  as local anesthetic, under direct ultrasound visualization, a 14 gauge spring-loaded device was used to perform biopsy of a LEFT axillary lymph node using a anti radial approach. At the conclusion of the procedure [facility] coil tissue marker clip was deployed into the biopsy cavity. Follow up 2 view mammogram was performed and dictated separately. IMPRESSION: Ultrasound guided biopsy of a LEFT breast mass at 12 o'clock and enlarged LEFT axillary lymph node. No apparent complications. Electronically Signed: By: Norleen Croak M.D. On: 07/24/2024 14:19   MM CLIP PLACEMENT LEFT Result Date: 07/24/2024 CLINICAL DATA:  Suspicious LEFT breast mass at 12 o'clock.  Enlarged LEFT axillary lymph node EXAM: 3D DIAGNOSTIC LEFT MAMMOGRAM POST ULTRASOUND BIOPSY x2 COMPARISON:  Previous exam(s). ACR Breast Density Category b: There are scattered areas of fibroglandular density. FINDINGS: 3D Mammographic images were obtained following ultrasound guided biopsy of the LEFT breast mass at 12 o'clock and a LEFT axillary lymph node. Appropriate positioning of the ribbon-shaped biopsy marking clip at the site of biopsy in the LEFT breast at 12 o'clock. Appropriate positioning of the HydroMARK coil-shaped biopsy marking clip at the site of biopsy in the LEFT axilla. Note that this clip shaped had been previously used in an adjacent lymph node, although morphological differences in shape and positioning make distinction of the 2 nodes possible under real-time examination. IMPRESSION: Appropriate positioning of the biopsy marking clips at the sites of biopsy in the LEFT breast and axilla. Final Assessment: Post Procedure Mammograms for Marker Placement Electronically Signed   By: Norleen Croak M.D.   On: 07/24/2024 14:21   MM  3D DIAGNOSTIC MAMMOGRAM UNILATERAL LEFT BREAST Result Date: 07/17/2024 CLINICAL DATA:  LEFT breast callback for calcifications and possible mass EXAM: DIGITAL DIAGNOSTIC UNILATERAL LEFT MAMMOGRAM WITH TOMOSYNTHESIS AND CAD; ULTRASOUND LEFT BREAST LIMITED TECHNIQUE: Left digital diagnostic mammography and breast tomosynthesis was performed. The images were evaluated with computer-aided detection. ; Targeted ultrasound examination of the left breast was performed. COMPARISON:  Previous exam(s). ACR Breast Density Category c: The breasts are heterogeneously dense, which may obscure small masses. FINDINGS: Spot compression magnification views confirm the presence of approximately 2 mm grouped benign dystrophic calcifications at 12 o'clock in the LEFT breast. These have been present since at least 2017 and have coarsened over time. They have minimally increased in number  since 2017, possibly due to increased conspicuity as result of the general coarsening, and have not increased in extent over that time. At the 12 o'clock position middle depth, spot compression views confirm a 5 mm round mass with indistinct margins, correlating with the area of concern on screening mammogram. Anterior and just inferior to the mass is an area of subtle architectural distortion seen both on the CC and MLO views, with several regional oil cysts. The patient reportedly had trauma to this region in March of 2016 (per January 2017 mammogram report), although she is unable to confirm that history at this time. In retrospect, this region appears similar dating back to at least April 2017 (for example CC slice 27/74 on that examination). Findings are favored to reflect distortion as a result of prior trauma, with long-term stability reassuring. Targeted ultrasound of the LEFT breast was performed with a round 4 x 5 x 4 mm hypoechoic mass with indistinct margins at the 12 o'clock position 7 cm from the nipple, correlating with the mammographic mass. An oil cyst is noted regionally at 12 o'clock 4 cm from the nipple. There is vague shadowing without discrete suspicious mass in the region of chronic architectural distortion noted on the mammogram, also at 12 o'clock 4 cm from the nipple. In the LEFT axilla there are multiple enlarged LEFT axillary lymph nodes, in keeping with prior findings on April 27, 2021. Of note, a LEFT axillary lymph node was previously biopsied with benign results on May 06, 2021. Direct comparison of the lymph nodes on today's examination to the prior is challenging, with lymph nodes favored to be slightly increased in size compared to 2022, with maximum cortical thickness today measuring up to 10 mm, previously 8 mm. A comparison RIGHT axillary ultrasound was briefly performed and confirmed the presence of persistent RIGHT axillary lymphadenopathy as well. IMPRESSION: 1. Suspicious 5  mm mass in the LEFT breast at 12 o'clock 7 cm from the nipple warrants further characterization with ultrasound-guided biopsy. 2. Enlarged LEFT axillary lymphadenopathy, stable to slightly increased from 2022 at which time it was biopsied with benign result, warrants repeat ultrasound-guided LEFT axillary biopsy to confirm benignity in the setting suspected ipsilateral breast cancer. Persistent RIGHT axillary lymphadenopathy was also noted on ultrasound performed for comparison sake. Recommend samples be sent in both formalin and saline so that flow cytometry can be performed. 3. Subtle architectural distortion and oil cysts in the superior LEFT breast in the region the highly suspicious LEFT breast mass. In retrospect the subtle distortion is likely unchanged dating back to April 2017, and the patient reportedly had trauma to this region in March of 2016, although she could not reconfirm that history today. Only subtle shadowing was noted in this region on targeted ultrasound, without definite discrete  correlate. Given patient's difficulty with her last ultrasound-guided biopsy, including significant pain and likely vasovagal response, the decision was made to defer stereotactic biopsy pending results of the above recommended ultrasound-guided biopsies. If the above biopsy confirms LEFT breast malignancy, recommend further characterization with stereotactic biopsy of the subtle distortion; alternatively, if there is plan for pretreatment MRI, biopsy could be deferred pending results of the MRI as lack of suspicious finding in this region, in conjunction with long-term stability of the distortion, would strongly suggest benignity. 4. LEFT breast calcifications described at the time of screening are similar in extent to 2017 and have coarsened over time, today demonstrating a dystrophic morphology consistent with a benign etiology. RECOMMENDATION: 1.  Ultrasound-guided biopsy of the LEFT breast/axilla x2. 2. Deferred  stereotactic biopsy (versus evaluation on pretreatment MRI) of likely posttraumatic, subtle LEFT breast distortion at 12 o'clock which is likely unchanged dating back to 2017, pending results of the above biopsy. I have discussed the findings and recommendations with the patient. If applicable, a reminder letter will be sent to the patient regarding the next appointment. BI-RADS CATEGORY  4: Suspicious. Electronically Signed   By: Norleen Croak M.D.   On: 07/17/2024 20:33   US  LIMITED ULTRASOUND INCLUDING AXILLA LEFT BREAST  Result Date: 07/17/2024 CLINICAL DATA:  LEFT breast callback for calcifications and possible mass EXAM: DIGITAL DIAGNOSTIC UNILATERAL LEFT MAMMOGRAM WITH TOMOSYNTHESIS AND CAD; ULTRASOUND LEFT BREAST LIMITED TECHNIQUE: Left digital diagnostic mammography and breast tomosynthesis was performed. The images were evaluated with computer-aided detection. ; Targeted ultrasound examination of the left breast was performed. COMPARISON:  Previous exam(s). ACR Breast Density Category c: The breasts are heterogeneously dense, which may obscure small masses. FINDINGS: Spot compression magnification views confirm the presence of approximately 2 mm grouped benign dystrophic calcifications at 12 o'clock in the LEFT breast. These have been present since at least 2017 and have coarsened over time. They have minimally increased in number since 2017, possibly due to increased conspicuity as result of the general coarsening, and have not increased in extent over that time. At the 12 o'clock position middle depth, spot compression views confirm a 5 mm round mass with indistinct margins, correlating with the area of concern on screening mammogram. Anterior and just inferior to the mass is an area of subtle architectural distortion seen both on the CC and MLO views, with several regional oil cysts. The patient reportedly had trauma to this region in March of 2016 (per January 2017 mammogram report), although she is  unable to confirm that history at this time. In retrospect, this region appears similar dating back to at least April 2017 (for example CC slice 27/74 on that examination). Findings are favored to reflect distortion as a result of prior trauma, with long-term stability reassuring. Targeted ultrasound of the LEFT breast was performed with a round 4 x 5 x 4 mm hypoechoic mass with indistinct margins at the 12 o'clock position 7 cm from the nipple, correlating with the mammographic mass. An oil cyst is noted regionally at 12 o'clock 4 cm from the nipple. There is vague shadowing without discrete suspicious mass in the region of chronic architectural distortion noted on the mammogram, also at 12 o'clock 4 cm from the nipple. In the LEFT axilla there are multiple enlarged LEFT axillary lymph nodes, in keeping with prior findings on April 27, 2021. Of note, a LEFT axillary lymph node was previously biopsied with benign results on May 06, 2021. Direct comparison of the lymph nodes on today's  examination to the prior is challenging, with lymph nodes favored to be slightly increased in size compared to 2022, with maximum cortical thickness today measuring up to 10 mm, previously 8 mm. A comparison RIGHT axillary ultrasound was briefly performed and confirmed the presence of persistent RIGHT axillary lymphadenopathy as well. IMPRESSION: 1. Suspicious 5 mm mass in the LEFT breast at 12 o'clock 7 cm from the nipple warrants further characterization with ultrasound-guided biopsy. 2. Enlarged LEFT axillary lymphadenopathy, stable to slightly increased from 2022 at which time it was biopsied with benign result, warrants repeat ultrasound-guided LEFT axillary biopsy to confirm benignity in the setting suspected ipsilateral breast cancer. Persistent RIGHT axillary lymphadenopathy was also noted on ultrasound performed for comparison sake. Recommend samples be sent in both formalin and saline so that flow cytometry can be performed.  3. Subtle architectural distortion and oil cysts in the superior LEFT breast in the region the highly suspicious LEFT breast mass. In retrospect the subtle distortion is likely unchanged dating back to April 2017, and the patient reportedly had trauma to this region in March of 2016, although she could not reconfirm that history today. Only subtle shadowing was noted in this region on targeted ultrasound, without definite discrete correlate. Given patient's difficulty with her last ultrasound-guided biopsy, including significant pain and likely vasovagal response, the decision was made to defer stereotactic biopsy pending results of the above recommended ultrasound-guided biopsies. If the above biopsy confirms LEFT breast malignancy, recommend further characterization with stereotactic biopsy of the subtle distortion; alternatively, if there is plan for pretreatment MRI, biopsy could be deferred pending results of the MRI as lack of suspicious finding in this region, in conjunction with long-term stability of the distortion, would strongly suggest benignity. 4. LEFT breast calcifications described at the time of screening are similar in extent to 2017 and have coarsened over time, today demonstrating a dystrophic morphology consistent with a benign etiology. RECOMMENDATION: 1.  Ultrasound-guided biopsy of the LEFT breast/axilla x2. 2. Deferred stereotactic biopsy (versus evaluation on pretreatment MRI) of likely posttraumatic, subtle LEFT breast distortion at 12 o'clock which is likely unchanged dating back to 2017, pending results of the above biopsy. I have discussed the findings and recommendations with the patient. If applicable, a reminder letter will be sent to the patient regarding the next appointment. BI-RADS CATEGORY  4: Suspicious. Electronically Signed   By: Norleen Croak M.D.   On: 07/17/2024 20:33   MM 3D SCREENING MAMMOGRAM BILATERAL BREAST Result Date: 07/12/2024 CLINICAL DATA:  Screening. EXAM:  DIGITAL SCREENING BILATERAL MAMMOGRAM WITH TOMOSYNTHESIS AND CAD TECHNIQUE: Bilateral screening digital craniocaudal and mediolateral oblique mammograms were obtained. Bilateral screening digital breast tomosynthesis was performed. The images were evaluated with computer-aided detection. COMPARISON:  Previous exam(s). ACR Breast Density Category b: There are scattered areas of fibroglandular density. FINDINGS: In the left breast, calcifications and a possible mass warrants further evaluation. In the right breast, no findings suspicious for malignancy. IMPRESSION: Further evaluation is suggested for calcifications and a possible mass in the left breast. RECOMMENDATION: Diagnostic mammogram and possibly ultrasound of the left breast. (Code:[id number]) The patient will be contacted regarding the findings, and additional imaging will be scheduled. BI-RADS CATEGORY  0: Incomplete: Need additional imaging evaluation. Electronically Signed   By: Reyes Phi M.D.   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  PLAN:    Clinical stage Ia ER/PR positive, HER2 negative invasive carcinoma of the left  breast: Patient has lumpectomy and sentinel biopsy scheduled for August 29, 2024.  Given the small size of malignancy, it is unlikely she will require Oncotype testing or adjuvant chemotherapy but will await final pathology results to make this determination.  Given that she is undergoing lumpectomy, she will benefit from adjuvant XRT and will arrange follow-up with radiation oncology approximately 2 weeks after her surgery.  Finally, the ER/PR positivity of her malignancy, she will benefit from letrozole for a total of 5 years starting at the conclusion of her XRT.  No further intervention is needed.  Return to clinic 2 weeks after her surgery to discuss her final pathology results and additional treatment planning if necessary.    I spent a total  of 60 minutes reviewing chart data, face-to-face evaluation with the patient, counseling and coordination of care as detailed above.  Patient expressed understanding and was in agreement with this plan. She also understands that She can call clinic at any time with any questions, concerns, or complaints.    Cancer Staging  Invasive ductal carcinoma of left breast [facility]) Staging form: Breast, AJCC 8th Edition - Clinical stage from 07/31/2024: Stage IA (cT1a, cN0, cM0, G2, ER+, PR+, HER2-) - Signed by Jacobo Evalene PARAS, MD on 07/31/2024 Stage prefix: Initial diagnosis Histologic grading system: 3 grade system   Evalene PARAS Jacobo, MD   07/31/2024 12:39 PM

## 2024-07-31 NOTE — Progress Notes (Signed)
 Patient is doing well, not really having any symptoms as of right now. She is worried about wether or not she will have to have chemo and radiation. She spoke with Dr. Loreli already.

## 2024-07-31 NOTE — Progress Notes (Signed)
 Accompanied patient and family to initial medical oncology appointment.   Reviewed Breast Cancer treatment handbook.   Care plan summary given to patient.   Reviewed outreach programs and cancer center services.

## 2024-08-06 ENCOUNTER — Ambulatory Visit
Admission: RE | Admit: 2024-08-06 | Discharge: 2024-08-06 | Disposition: A | Discharge status: Home / Self Care | Source: Ambulatory Visit | Attending: Surgery | Admitting: Surgery

## 2024-08-06 ENCOUNTER — Ambulatory Visit
Admission: RE | Admit: 2024-08-06 | Discharge: 2024-08-06 | Disposition: A | Discharge status: Home / Self Care | Source: Ambulatory Visit | Attending: Surgery

## 2024-08-06 ENCOUNTER — Other Ambulatory Visit: Payer: Self-pay | Admitting: Surgery

## 2024-08-06 DIAGNOSIS — C50112 Malignant neoplasm of central portion of left female breast: Secondary | ICD-10-CM | POA: Diagnosis present

## 2024-08-06 DIAGNOSIS — R928 Other abnormal and inconclusive findings on diagnostic imaging of breast: Secondary | ICD-10-CM

## 2024-08-06 DIAGNOSIS — Z170 Estrogen receptor positive status [ER+]: Secondary | ICD-10-CM | POA: Insufficient documentation

## 2024-08-06 HISTORY — PX: BREAST BIOPSY: SHX20

## 2024-08-06 MED ORDER — LIDOCAINE HCL (PF) 1 % IJ SOLN
5.0000 mL | Freq: Once | INTRAMUSCULAR | Status: AC
Start: 2024-08-06 — End: 2024-08-06
  Administered 2024-08-06: 5 mL
  Filled 2024-08-06: qty 5

## 2024-08-06 MED ORDER — LIDOCAINE HCL (PF) 1 % IJ SOLN
2.0000 mL | Freq: Once | INTRAMUSCULAR | Status: AC
Start: 2024-08-06 — End: 2024-08-06
  Administered 2024-08-06: 2 mL
  Filled 2024-08-06: qty 2

## 2024-08-14 ENCOUNTER — Ambulatory Visit: Attending: Oncology | Admitting: Occupational Therapy

## 2024-08-14 ENCOUNTER — Encounter: Payer: Self-pay | Admitting: Occupational Therapy

## 2024-08-14 DIAGNOSIS — R293 Abnormal posture: Secondary | ICD-10-CM | POA: Diagnosis present

## 2024-08-14 NOTE — Therapy (Signed)
 OUTPATIENT OCCUPATIONAL THERAPY BREAST CANCER BASELINE EVALUATION   Patient Name: Anita Reilly MRN: [id number] DOB:1946/04/22, 78 y.o., female Today's Date: 08/14/2024  END OF SESSION:  OT End of Session - 08/14/24 1539     Visit Number 1    Number of Visits 5    Date for Recertification  11/06/24    OT Start Time 1435    OT Stop Time 1521    OT Time Calculation (min) 46 min    Activity Tolerance Patient tolerated treatment well    Behavior During Therapy [facility] for tasks assessed/performed          Past Medical History:  Diagnosis Date   Arthritis    shoulders   Bell's palsy 2016   Vertigo    none in several years   Past Surgical History:  Procedure Laterality Date   BREAST BIOPSY Left 05/06/2021   u/s bx, hydromark, path pending   BREAST BIOPSY Left 07/24/2024   US  LT BREAST BX W LOC DEV 1ST LESION IMG BX SPEC US  GUIDE 07/24/2024 [facility]   BREAST BIOPSY  08/06/2024   MM LT BREAST SAVI/RF TAG EA ADD'L LESION MAMMO GUIDE 08/06/2024 [facility]   BREAST BIOPSY  08/06/2024   MM LT BREAST SAVI/RF TAG 1ST LESION MAMMO GUIDE 08/06/2024 [facility]   CARPAL TUNNEL RELEASE     left    CATARACT EXTRACTION W/PHACO Right 09/19/2018   Procedure: CATARACT EXTRACTION PHACO AND INTRAOCULAR LENS PLACEMENT (IOC) RIGHT;  Surgeon: Mittie Gaskin, MD;  Location: [facility];  Service: Ophthalmology;  Laterality: Right;  latex sensitivity   CATARACT EXTRACTION W/PHACO Left 10/10/2018   Procedure: CATARACT EXTRACTION PHACO AND INTRAOCULAR LENS PLACEMENT (IOC)  LEFT;  Surgeon: Mittie Gaskin, MD;  Location: [facility];  Service: Ophthalmology;  Laterality: Left;  Latex sensitivity requests arrival around 10 or 10:30   COLONOSCOPY     Patient Active Problem List   Diagnosis Date Noted   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/31/2024   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   Acute stroke due to ischemia [facility]) 04/27/2024   Right sided  weakness 08/16/2012   Facial droop 08/16/2012   Bell's palsy 08/16/2012    PCP: Dr Lenon MART PROVIDER: Dr Jacobo  REFERRING DIAG: L breast Cancer  THERAPY DIAG:  Abnormal posture  Rationale for Evaluation and Treatment: Rehabilitation  ONSET DATE: 07/24/24  SUBJECTIVE:                                                                                                                                                                                           SUBJECTIVE STATEMENT: Patient reports she  is here today after being refer by one of her medical team for her newly diagnosed left breast cancer.   PERTINENT HISTORY:  Patient was diagnosed with left  breast cancer - Pt schedule for L lumpectomy by Dr Tye  PATIENT GOALS:   reduce lymphedema risk and learn post op HEP.   PAIN:  Are you having pain? L breast tender since biopsy  PRECAUTIONS: Active CA ,L lymphedema    HAND DOMINANCE: right  WEIGHT BEARING RESTRICTIONS: No  FALLS:  Has patient fallen in last 6 months? No  LIVING ENVIRONMENT: Patient lives with: alone  OCCUPATION and LEISURE: Pt retired - prior to light stroke in June 25 pt mow her own lawn and done yardwork - since then gets tired and still weakness in L side per pt - independent  Has son and daughter in town to assist as needed      OBJECTIVE:  COGNITION: Overall cognitive status: Within functional limits for tasks assessed    POSTURE:  Forward head and rounded shoulders posture- L more than R   UPPER EXTREMITY AROM/PROM: Bilateral UE AROM WFL - 160 flexion and ABD  Ext rotation 80 CERVICAL AROM: All within normal limits:    Percent limited  Flexion   Extension   Right lateral flexion   Left lateral flexion   Right rotation   Left rotation     UPPER EXTREMITY STRENGTH: WFL   LYMPHEDEMA ASSESSMENTS: circumference NT today  L-DEX LYMPHEDEMA SCREENING:  The patient was assessed using the L-Dex machine today to produce  a lymphedema index baseline score. The patient will be reassessed on a regular basis (typically every 3 months) to obtain new L-Dex scores. If the score is > 6.5 points away from his/her baseline score indicating onset of subclinical lymphedema, it will be recommended to wear a compression garment for 4 weeks, 12 hours per day and then be reassessed. If the score continues to be > 6.5 points from baseline at reassessment, we will initiate lymphedema treatment. Assessing in this manner has a 95% rate of preventing clinically significant lymphedema.    L-DEX FLOWSHEETS - 08/14/24 1500       L-DEX LYMPHEDEMA SCREENING   Measurement Type Unilateral    L-DEX MEASUREMENT EXTREMITY Upper Extremity    POSITION  Standing    DOMINANT SIDE Right    At Risk Side Left    BASELINE SCORE (UNILATERAL) 4.7             PATIENT EDUCATION:  Education details: Lymphedema risk reduction and post op shoulder/posture HEP Person educated: Patient Education method: Programmer, multimedia, Demonstration, Handout Education comprehension: Patient verbalized understanding and returned demonstration  HOME EXERCISE PROGRAM: Patient can start active assisted range of motion about 5 to 7 days after surgery to 90 degrees and then gradually overhead when okayed by surgeon bilateral shoulder flexion using wand in supine as well as shoulder abduction.  10 reps pain-free As well as external rotation in supine using gravity 10 reps to 3 times a day As well as scapular squeezes up to 3 5 times a day for posture. Patient can start today with sit to stands 10 reps 2 times a day not using hands Sidestepping holding onto the kitchen counter 1 to 2 minutes left and right when fatigue or dragging left leg can stop. ASSESSMENT:  CLINICAL IMPRESSION: Her multidisciplinary medical team has met to assess and determine a recommended treatment plan. She is planning to have left lumpectomy on 08/29/2024 by Dr. Tye. She will  benefit from a post  op OT reassessment to determine needs and from L-Dex screens every 3 months for 2 years to detect subclinical lymphedema.  Pt will benefit from skilled therapeutic intervention to improve on the following deficits: Decreased knowledge of precautions and lymphedema education, impaired UE functional use, pain, decreased ROM, postural dysfunction.   OT treatment/interventions: ADL/self-care home management, pt/family education, therapeutic exercise,manual therapy  REHAB POTENTIAL: Good  CLINICAL DECISION MAKING: Stable/uncomplicated  EVALUATION COMPLEXITY: Low   GOALS: Goals reviewed with patient? YES  LONG TERM GOALS: (STG=LTG)    Name Target Date Goal status  1 Pt will be able to verbalize understanding of pertinent lymphedema risk reduction practices relevant to her dx specifically related to skin care.  Baseline:  No knowledge 6 weeks Initial  2 Pt will be able to return demo and/or verbalize understanding of the post op HEP related to regaining shoulder ROM. Baseline:  No knowledge Today Achieved at eval       4 Pt will demo she has regained full shoulder ROM and function post operatively compared to baselines.  Baseline: See objective measurements taken today. 12 weeks Initial    PLAN:  OT FREQUENCY/DURATION: EVAL and 1-4 follow up appointments for 12 weeks.   PLAN FOR NEXT SESSION: will reassess 2-3  weeks post op to determine needs.   Patient will follow up at outpatient cancer rehab 3-4 weeks following surgery.  If the patient requires occupational therapy at that time, a specific plan will be dictated and sent to the referring physician for approval.  Occupational Therapy Information for After Breast Cancer Surgery/Treatment:  Lymphedema is a swelling condition that you may be at risk for in your arm if you have lymph nodes removed from the armpit area.  After a sentinel node biopsy, the risk is approximately 5-9% and is higher after an axillary node dissection.  There is  treatment available for this condition and it is not life-threatening.  Contact your physician or occupational therapist with concerns. You may begin the 4 shoulder/posture exercises (see additional sheet) when permitted by your physician (typically a week after surgery).  If you have drains, you may need to wait until those are removed before beginning range of motion exercises.  A general recommendation is to not lift your arms above shoulder height until drains are removed.  These exercises should be done to your tolerance and gently.  This is not a no pain/no gain type of recovery so listen to your body and stretch into the range of motion that you can tolerate, stopping if you have pain.  If you are having immediate reconstruction, ask your plastic surgeon about doing exercises as he or she may want you to wait. .  While undergoing any medical procedure or treatment, try to avoid blood pressure being taken or needle sticks from occurring on the arm on the side of cancer.   This recommendation begins after surgery and continues for the rest of your life.  This may help reduce your risk of getting lymphedema (swelling in your arm). An excellent resource for those seeking information on lymphedema is the National Lymphedema Network's web site. It can be accessed at www.lymphnet.org If you notice swelling in your hand, arm or breast at any time following surgery (even if it is many years from now), please contact your doctor or occupational therapist to discuss this.  Lymphedema can be treated at any time but it is easier for you if it is treated early on.  If you feel like your shoulder motion is not returning to normal in a reasonable amount of time, please contact your surgeon or occupational therapist.  [facility] [phone]. [address], [place], [place] [address]       Ancel Peters, OTR/L,CLT 08/14/2024, 3:42 PM

## 2024-08-21 ENCOUNTER — Encounter
Admission: RE | Admit: 2024-08-21 | Discharge: 2024-08-21 | Disposition: A | Discharge status: Home / Self Care | Source: Ambulatory Visit | Attending: Surgery | Admitting: Surgery

## 2024-08-21 ENCOUNTER — Inpatient Hospital Stay (HOSPITAL_BASED_OUTPATIENT_CLINIC_OR_DEPARTMENT_OTHER): Admitting: Hospice and Palliative Medicine

## 2024-08-21 ENCOUNTER — Other Ambulatory Visit: Payer: Self-pay

## 2024-08-21 DIAGNOSIS — C50912 Malignant neoplasm of unspecified site of left female breast: Secondary | ICD-10-CM | POA: Insufficient documentation

## 2024-08-21 DIAGNOSIS — Z1721 Progesterone receptor positive status: Secondary | ICD-10-CM | POA: Insufficient documentation

## 2024-08-21 DIAGNOSIS — Z1732 Human epidermal growth factor receptor 2 negative status: Secondary | ICD-10-CM | POA: Insufficient documentation

## 2024-08-21 DIAGNOSIS — Z170 Estrogen receptor positive status [ER+]: Secondary | ICD-10-CM | POA: Insufficient documentation

## 2024-08-21 NOTE — Progress Notes (Signed)
 Multidisciplinary Oncology Council Documentation  Anita Reilly was presented by our [facility] on 08/21/2024, which included representatives from:  Palliative Care Dietitian  Physical/Occupational Therapist Nurse Navigator Genetics Social work Survivorship RN Financial Navigator Research RN   Anita Reilly currently presents with history of breast cancer  We reviewed previous medical and familial history, history of present illness, and recent lab results along with all available histopathologic and imaging studies. The MOC considered available treatment options and made the following recommendations/referrals:  Rehab screening  The MOC is a meeting of clinicians from various specialty areas who evaluate and discuss patients for whom a multidisciplinary approach is being considered. Final determinations in the plan of care are those of the provider(s).   Today's extended care, comprehensive team conference, Anita Reilly was not present for the discussion and was not examined.

## 2024-08-21 NOTE — Patient Instructions (Addendum)
 Your procedure is scheduled on: THURSDAY OCTOBER 9  Report to the Registration Desk on the 1st floor of the [facility] To find out your arrival time, please call [phone] between 1PM - 3PM on:  [facility] OCTOBER 8  If your arrival time is 6:00 am, do not arrive before that time as the [facility] entrance doors do not open until 6:00 am.  REMEMBER: Instructions that are not followed completely may result in serious medical risk, up to and including death; or upon the discretion of your surgeon and anesthesiologist your surgery may need to be rescheduled.  Do not eat food after midnight the night before surgery.  No gum chewing or hard candies.  You may however, drink CLEAR liquids up to 2 hours before you are scheduled to arrive for your surgery. Do not drink anything within 2 hours of your scheduled arrival time.  Clear liquids include: - water  - apple juice without pulp - gatorade (not RED colors) - black coffee or tea (Do NOT add milk or creamers to the coffee or tea) Do NOT drink anything that is not on this list.   One week prior to surgery: STARTING THURSDAY OCTOBER 2  Stop Anti-inflammatories (NSAIDS) such as Advil, Aleve, Ibuprofen, Motrin, Naproxen, Naprosyn and Aspirin  based products such as Excedrin, Goody's Powder, BC Powder. Stop ANY OVER THE COUNTER supplements until after surgery. Calcium  Carbonate-Vitamin D (CALTRATE 600+D PO)  Multiple Vitamins-Minerals   You may however, continue to take Tylenol  if needed for pain up until the day of surgery.  Continue taking all of your other prescription medications up until the day of surgery.  ON THE DAY OF SURGERY DO NOT TAKE ANY  MEDICATIONS   No Alcohol for 24 hours before or after surgery.  Do not use any recreational drugs for at least a week (preferably 2 weeks) before your surgery.  Please be advised that the combination of cocaine and anesthesia may have negative outcomes, up to and including death. If  you test positive for cocaine, your surgery will be cancelled.  On the morning of surgery brush your teeth with toothpaste and water, you may rinse your mouth with mouthwash if you wish. Do not swallow any toothpaste or mouthwash.  Use CHG Soap as directed on instruction sheet.  Do not wear jewelry, make-up, hairpins, clips or nail polish.  For welded (permanent) jewelry: bracelets, anklets, waist bands, etc.  Please have this removed prior to surgery.  If it is not removed, there is a chance that hospital personnel will need to cut it off on the day of surgery.  Do not wear lotions, powders, or perfumes.   Do not shave body hair from the neck down 48 hours before surgery.  Contact lenses, hearing aids and dentures may not be worn into surgery.  Do not bring valuables to the hospital. [facility] is not responsible for any missing/lost belongings or valuables.    Notify your doctor if there is any change in your medical condition (cold, fever, infection).  Wear comfortable clothing (specific to your surgery type) to the hospital.  After surgery, you can help prevent lung complications by doing breathing exercises.  Take deep breaths and cough every 1-2 hours.   If you are being discharged the day of surgery, you will not be allowed to drive home. You will need a responsible individual to drive you home and stay with you for 24 hours after surgery.   If you are taking public transportation, you  will need to have a responsible individual with you.  Please call the Pre-admissions Testing Dept. at [phone] if you have any questions about these instructions.  Surgery Visitation Policy:  Patients having surgery or a procedure may have two visitors.  Children under the age of 66 must have an adult with them who is not the patient.   Merchandiser, retail to address health-related social needs:  [email or web address]                                                                                                                Preparing for Surgery with CHLORHEXIDINE  GLUCONATE (CHG) Soap  Chlorhexidine  Gluconate (CHG) Soap  o An antiseptic cleaner that kills germs and bonds with the skin to continue killing germs even after washing  o Used for showering the night before surgery and morning of surgery  Before surgery, you can play an important role by reducing the number of germs on your skin.  CHG (Chlorhexidine  gluconate) soap is an antiseptic cleanser which kills germs and bonds with the skin to continue killing germs even after washing.  Please do not use if you have an allergy to CHG or antibacterial soaps. If your skin becomes reddened/irritated stop using the CHG.  1. Shower the NIGHT BEFORE SURGERY and the MORNING OF SURGERY with CHG soap.  2. If you choose to wash your hair, wash your hair first as usual with your normal shampoo.  3. After shampooing, rinse your hair and body thoroughly to remove the shampoo.  4. Use CHG as you would any other liquid soap. You can apply CHG directly to the skin and wash gently with a scrungie or a clean washcloth.  5. Apply the CHG soap to your body only from the neck down. Do not use on open wounds or open sores. Avoid contact with your eyes, ears, mouth, and genitals (private parts). Wash face and genitals (private parts) with your normal soap.  6. Wash thoroughly, paying special attention to the area where your surgery will be performed.  7. Thoroughly rinse your body with warm water.  8. Do not shower/wash with your normal soap after using and rinsing off the CHG soap.  9. Pat yourself dry with a clean towel.  10. Wear clean pajamas to bed the night before surgery.  11. Place clean sheets on your bed the night of your first shower and do not sleep with pets.  12. Shower again with the CHG soap on the day of surgery prior to arriving at the hospital.  13. Do not apply any  deodorants/lotions/powders.  14. Please wear clean clothes to the hospital.

## 2024-08-28 MED ORDER — ORAL CARE MOUTH RINSE: 15.0000 mL | Freq: Once | OROMUCOSAL | Status: AC

## 2024-08-28 MED ORDER — CEFAZOLIN SODIUM-DEXTROSE 2-4 GM/100ML-% IV SOLN
2.0000 g | INTRAVENOUS | Status: AC
  Administered 2024-08-29: 2 g via INTRAVENOUS

## 2024-08-28 MED ORDER — LACTATED RINGERS IV SOLN: INTRAVENOUS | Status: DC

## 2024-08-28 MED ORDER — CHLORHEXIDINE GLUCONATE 0.12 % MT SOLN
15.0000 mL | Freq: Once | OROMUCOSAL | Status: AC
Start: 2024-08-28 — End: 2024-08-29
  Administered 2024-08-29: 15 mL via OROMUCOSAL

## 2024-08-28 MED ORDER — CHLORHEXIDINE GLUCONATE CLOTH 2 % EX PADS
6.0000 | MEDICATED_PAD | Freq: Once | CUTANEOUS | Status: AC
  Administered 2024-08-29: 6 via TOPICAL

## 2024-08-29 ENCOUNTER — Other Ambulatory Visit: Payer: Self-pay

## 2024-08-29 ENCOUNTER — Ambulatory Visit
Admission: RE | Admit: 2024-08-29 | Discharge: 2024-08-29 | Disposition: A | Discharge status: Home / Self Care | Attending: Surgery | Admitting: Surgery

## 2024-08-29 ENCOUNTER — Ambulatory Visit: Payer: Self-pay | Admitting: Urgent Care

## 2024-08-29 ENCOUNTER — Encounter: Payer: Self-pay | Admitting: Surgery

## 2024-08-29 ENCOUNTER — Encounter: Admission: RE | Admit: 2024-08-29 | Discharge: 2024-08-29 | Attending: Surgery

## 2024-08-29 ENCOUNTER — Ambulatory Visit
Admission: RE | Admit: 2024-08-29 | Discharge: 2024-08-29 | Disposition: A | Source: Ambulatory Visit | Attending: Surgery

## 2024-08-29 ENCOUNTER — Encounter
Admission: RE | Disposition: A | Discharge status: Home / Self Care | Payer: Self-pay | Source: Home / Self Care | Attending: Surgery

## 2024-08-29 DIAGNOSIS — C50912 Malignant neoplasm of unspecified site of left female breast: Secondary | ICD-10-CM | POA: Diagnosis present

## 2024-08-29 DIAGNOSIS — C50112 Malignant neoplasm of central portion of left female breast: Secondary | ICD-10-CM | POA: Diagnosis not present

## 2024-08-29 DIAGNOSIS — J45909 Unspecified asthma, uncomplicated: Secondary | ICD-10-CM | POA: Diagnosis not present

## 2024-08-29 DIAGNOSIS — Z170 Estrogen receptor positive status [ER+]: Secondary | ICD-10-CM | POA: Insufficient documentation

## 2024-08-29 HISTORY — PX: AXILLARY SENTINEL NODE BIOPSY: SHX5738

## 2024-08-29 HISTORY — PX: BREAST LUMPECTOMY WITH RADIO FREQUENCY LOCALIZER: SHX6897

## 2024-08-29 SURGERY — BREAST LUMPECTOMY WITH RADIO FREQUENCY LOCALIZER
Anesthesia: General | Site: Breast | Laterality: Left

## 2024-08-29 MED ORDER — TECHNETIUM TC 99M TILMANOCEPT KIT
1.0000 | PACK | Freq: Once | INTRAVENOUS | Status: AC | PRN
  Administered 2024-08-29: 1.07 via INTRADERMAL

## 2024-08-29 MED ORDER — TRAMADOL HCL 50 MG PO TABS
50.0000 mg | ORAL_TABLET | Freq: Three times a day (TID) | ORAL | 0 refills | Status: AC | PRN
  Filled 2024-08-29: qty 6, 2d supply, fill #0

## 2024-08-29 MED ORDER — PHENYLEPHRINE 80 MCG/ML (10ML) SYRINGE FOR IV PUSH (FOR BLOOD PRESSURE SUPPORT)
PREFILLED_SYRINGE | INTRAVENOUS | Status: DC | PRN
  Administered 2024-08-29: 80 ug via INTRAVENOUS

## 2024-08-29 MED ORDER — ACETAMINOPHEN 10 MG/ML IV SOLN: 1000.0000 mg | Freq: Once | INTRAVENOUS | Status: DC | PRN

## 2024-08-29 MED ORDER — PROPOFOL 10 MG/ML IV BOLUS
INTRAVENOUS | Status: DC | PRN
  Administered 2024-08-29: 120 mg via INTRAVENOUS
  Administered 2024-08-29: 50 mg via INTRAVENOUS
  Administered 2024-08-29: 80 mg via INTRAVENOUS

## 2024-08-29 MED ORDER — LACTATED RINGERS IV SOLN: INTRAVENOUS | Status: DC | PRN

## 2024-08-29 MED ORDER — EPHEDRINE SULFATE-NACL 50-0.9 MG/10ML-% IV SOSY
PREFILLED_SYRINGE | INTRAVENOUS | Status: DC | PRN
  Administered 2024-08-29: 10 mg via INTRAVENOUS

## 2024-08-29 MED ORDER — CEFAZOLIN SODIUM-DEXTROSE 2-4 GM/100ML-% IV SOLN
INTRAVENOUS | Status: AC
  Filled 2024-08-29: qty 100

## 2024-08-29 MED ORDER — FENTANYL CITRATE (PF) 100 MCG/2ML IJ SOLN
INTRAMUSCULAR | Status: AC
  Filled 2024-08-29: qty 2

## 2024-08-29 MED ORDER — PROPOFOL 1000 MG/100ML IV EMUL
INTRAVENOUS | Status: AC
  Filled 2024-08-29: qty 100

## 2024-08-29 MED ORDER — BUPIVACAINE-EPINEPHRINE 0.5% -1:200000 IJ SOLN
INTRAMUSCULAR | Status: DC | PRN
  Administered 2024-08-29: 15 mL

## 2024-08-29 MED ORDER — FENTANYL CITRATE (PF) 100 MCG/2ML IJ SOLN: 25.0000 ug | INTRAMUSCULAR | Status: DC | PRN

## 2024-08-29 MED ORDER — LACTATED RINGERS IV SOLN: INTRAVENOUS | Status: DC

## 2024-08-29 MED ORDER — ONDANSETRON HCL 4 MG/2ML IJ SOLN: 4.0000 mg | Freq: Once | INTRAMUSCULAR | Status: DC | PRN

## 2024-08-29 MED ORDER — FENTANYL CITRATE (PF) 100 MCG/2ML IJ SOLN
INTRAMUSCULAR | Status: DC | PRN
  Administered 2024-08-29 (×2): 50 ug via INTRAVENOUS

## 2024-08-29 MED ORDER — PROPOFOL 500 MG/50ML IV EMUL
INTRAVENOUS | Status: DC | PRN
  Administered 2024-08-29: 125 ug/kg/min via INTRAVENOUS

## 2024-08-29 MED ORDER — TRAMADOL HCL 50 MG PO TABS
50.0000 mg | ORAL_TABLET | Freq: Once | ORAL | Status: AC
  Administered 2024-08-29: 50 mg via ORAL

## 2024-08-29 MED ORDER — BUPIVACAINE-EPINEPHRINE (PF) 0.5% -1:200000 IJ SOLN
INTRAMUSCULAR | Status: AC
  Filled 2024-08-29: qty 30

## 2024-08-29 MED ORDER — LIDOCAINE HCL (CARDIAC) PF 100 MG/5ML IV SOSY
PREFILLED_SYRINGE | INTRAVENOUS | Status: DC | PRN
  Administered 2024-08-29: 80 mg via INTRAVENOUS

## 2024-08-29 MED ORDER — TRAMADOL HCL 50 MG PO TABS
ORAL_TABLET | ORAL | Status: AC
  Filled 2024-08-29: qty 1

## 2024-08-29 MED ORDER — PROPOFOL 10 MG/ML IV BOLUS
INTRAVENOUS | Status: AC
  Filled 2024-08-29: qty 20

## 2024-08-29 MED ORDER — LIDOCAINE HCL (PF) 2 % IJ SOLN
INTRAMUSCULAR | Status: AC
  Filled 2024-08-29: qty 5

## 2024-08-29 MED ORDER — CHLORHEXIDINE GLUCONATE 0.12 % MT SOLN
OROMUCOSAL | Status: AC
  Filled 2024-08-29: qty 15

## 2024-08-29 SURGICAL SUPPLY — 32 items
BLADE PHOTON 3 ILLUMINATED (MISCELLANEOUS) ×2 IMPLANT
BLADE SURG 15 STRL LF DISP TIS (BLADE) ×2 IMPLANT
CHLORAPREP W/TINT 26 (MISCELLANEOUS) IMPLANT
COVER PROBE GAMMA FINDER SLV (MISCELLANEOUS) ×2 IMPLANT
DERMABOND ADVANCED .7 DNX12 (GAUZE/BANDAGES/DRESSINGS) ×2 IMPLANT
DEVICE DUBIN SPECIMEN MAMMOGRA (MISCELLANEOUS) ×2 IMPLANT
DRAPE LAPAROTOMY TRNSV 106X77 (MISCELLANEOUS) ×2 IMPLANT
ELECTRODE REM PT RTRN 9FT ADLT (ELECTROSURGICAL) ×2 IMPLANT
GAUZE 4X4 16PLY ~~LOC~~+RFID DBL (SPONGE) IMPLANT
GLOVE BIOGEL PI IND STRL 7.0 (GLOVE) ×2 IMPLANT
GLOVE SURG SYN 6.5 PF PI (GLOVE) ×6 IMPLANT
GOWN STRL REUS W/ TWL LRG LVL3 (GOWN DISPOSABLE) ×6 IMPLANT
KIT MARKER MARGIN INK (KITS) ×2 IMPLANT
KIT TURNOVER KIT A (KITS) ×2 IMPLANT
LABEL OR SOLS (LABEL) ×2 IMPLANT
LIGHT WAVEGUIDE WIDE FLAT (MISCELLANEOUS) IMPLANT
MANIFOLD NEPTUNE II (INSTRUMENTS) ×2 IMPLANT
MARKER MARGIN CORRECT CLIP (MARKER) ×2 IMPLANT
NDL HYPO 22X1.5 SAFETY MO (MISCELLANEOUS) ×4 IMPLANT
NEEDLE HYPO 22X1.5 SAFETY MO (MISCELLANEOUS) ×4 IMPLANT
PACK BASIN MINOR ARMC (MISCELLANEOUS) ×2 IMPLANT
SHEATH BREAST BIOPSY SKIN MKR (SHEATH) ×2 IMPLANT
SOLN STERILE WATER 1000 ML (IV SOLUTION) ×2 IMPLANT
SOLN STERILE WATER BTL 1000 ML (IV SOLUTION) ×2 IMPLANT
SUT SILK 2-0 30XBRD TIE 12 (SUTURE) IMPLANT
SUT SILK 3 0 12 30 (SUTURE) IMPLANT
SUT VIC AB 3-0 SH 27X BRD (SUTURE) ×2 IMPLANT
SUTURE MNCRL 4-0 27XMF (SUTURE) ×2 IMPLANT
SYR 20ML LL LF (SYRINGE) ×2 IMPLANT
TRAP FLUID SMOKE EVACUATOR (MISCELLANEOUS) ×2 IMPLANT
TRAP NEPTUNE SPECIMEN COLLECT (MISCELLANEOUS) ×2 IMPLANT
WATER STERILE IRR 500ML POUR (IV SOLUTION) ×2 IMPLANT

## 2024-08-29 NOTE — Anesthesia Postprocedure Evaluation (Signed)
 Anesthesia Post Note  Patient: Anita Reilly  Procedure(s) Performed: BREAST LUMPECTOMY WITH RADIO FREQUENCY LOCALIZER (Left) BIOPSY, LYMPH NODE, SENTINEL, AXILLARY (Left: Breast)  Patient location during evaluation: PACU Anesthesia Type: General Level of consciousness: awake and alert, oriented and patient cooperative Pain management: pain level controlled Vital Signs Assessment: post-procedure vital signs reviewed and stable Respiratory status: spontaneous breathing, nonlabored ventilation and respiratory function stable Cardiovascular status: blood pressure returned to baseline and stable Postop Assessment: adequate PO intake Anesthetic complications: no   No notable events documented.   Last Vitals:  Vitals:   08/29/24 1300 08/29/24 1315  BP: (!) 156/88 (!) 142/94  Pulse: 64 61  Resp: 14 19  Temp:  (!) 36.3 C  SpO2: 96% 97%    Last Pain:  Vitals:   08/29/24 1319  TempSrc:   PainSc: 4                  Khloei Spiker

## 2024-08-29 NOTE — Op Note (Signed)
 Preoperative diagnosis: Left breast carcinoma  Postoperative diagnosis: Same.   Procedure: SCOUT tag-localized left breast partial mastectomy.                      Left axillary Sentinel Lymph node biopsy  Anesthesia: GETA  Surgeon: Dr. Henriette Pierre  Wound Classification: Clean  Indications: Patient is a 78 y.o. female with a nonpalpable left breast mass noted on mammography with core biopsy demonstrating cancer, requires SCOUT localizer placement, partial mastectomy for treatment with sentinel lymph node biopsy.  Adjacent area of architectural distortion previously discussed plan to be excised en bloc.  Specimen: Left breast mass, left breast architectural distortion, Sentinel Lymph nodes x 3  Complications: None  Estimated Blood Loss: 15 mL  Findings: 1. Specimen mammography shows marker and SCOUT localizer on specimen 2. Pathology call refers gross examination of margins was positive anterior inferior, which is the location of the architectural distortion previously noted on mammogram.  Prior to callback, gross examination of the cavity noted some tissue changes in the general area so additional tissue was removed and sent to pathology.  Second callback of the architectural distortion was negative for any obvious malignancy 3. No other palpable mass or lymph node identified.   Operation performed with curative intent:Yes  Tracer(s) used to identify sentinel nodes in the upfront surgery (non-neoadjuvant) setting (select all that apply):Radioactive Tracer  Tracer(s) used to identify sentinel nodes in the neoadjuvant setting (select all that apply):N/A  All nodes (colored or non-colored) present at the end of a dye-filled lymphatic channel were removed:N/A  All significantly radioactive nodes were removed:Yes  All palpable suspicious nodes were removed:Yes  Biopsy-proven positive nodes marked with clips prior to chemotherapy were identified and removed:N/A    Description of  procedure: SCOUT localization was performed by radiology prior to procedure. In the nuclear medicine suite, the subareolar region was injected with Tc-99 sulfur colloid the morning of procedure. Localization studies were reviewed. The patient was taken to the operating room and placed supine on the operating table, and after general anesthesia the left breast and axilla were prepped and draped in the usual sterile fashion. A time-out was completed verifying correct patient, procedure, site, positioning, and implant(s) and/or special equipment prior to beginning this procedure.  By identifying the SCOUT localizer, the probable trajectory and location of the mass was visualized. A skin incision was planned in such a way as to minimize the amount of dissection to reach the mass.  The skin incision was made after infusion of local. Flaps were raised and  Sharp and blunt dissection was then taken down to the mass, taking care to include the entire SCOUT localizer and a margin of grossly normal tissue. The specimen was removed. The specimen was oriented with paint. Imaging reviewed and the entire target lesion had been resected, with biopsy clip and localizer within the specimen.Pathology call refers gross examination of margins was positive anterior inferior, which is the location of the architectural distortion previously noted on mammogram.    Prior to callback, gross examination of the cavity noted some tissue changes in the general area so additional tissue was removed and sent to pathology, labeled as architectural distortion.  Second callback of the architectural distortion specimen was negative for any obvious malignancy.  A hand-held gamma probe was used to identify the location of the hottest spot in the axilla. An incision was made around the caudal axillary hairline. Sharp and blunt Dissection was carried down to subdermal facias. The  probe was placed within wound and again, the point of maximal count  was found. Dissection continue until nodule was identified. The probe was placed in contact with the node and 300 counts were recorded. The node was excised in its entirety. Ex vivo, the node measured 350 counts when placed on the probe. The bed of the node measured less than 100 counts.  2 additional hot spot was detected and the node was excised in similar fashion. No additional hot spots were identified.  Area noted to have high counts also had clinically palpable enlarged nodes.  No additional enlarged nodes noted after excision of hotspots.  Both wounds irrigated, hemostasis was achieved and the wound closed in layers with  interrupted sutures of 3-0 Vicryl in deep dermal layer and a running subcuticular suture of Monocryl 4-0, then dressed with dermabond. The patient tolerated the procedure well and was taken to the postanesthesia care unit in stable condition. Sponge and instrument count correct at end of procedure.

## 2024-08-29 NOTE — Discharge Instructions (Signed)
 Removal, Care After This sheet gives you information about how to care for yourself after your procedure. Your health care provider may also give you more specific instructions. If you have problems or questions, contact your health care provider. What can I expect after the procedure? After the procedure, it is common to have: Soreness. Bruising. Itching. Follow these instructions at home: site care Follow instructions from your health care provider about how to take care of your site. Make sure you: Wash your hands with soap and water before and after you change your bandage (dressing). If soap and water are not available, use hand sanitizer. Leave stitches (sutures), skin glue, or adhesive strips in place. These skin closures may need to stay in place for 2 weeks or longer. If adhesive strip edges start to loosen and curl up, you may trim the loose edges. Do not remove adhesive strips completely unless your health care provider tells you to do that. If the area bleeds or bruises, apply gentle pressure for 10 minutes. OK TO SHOWER IN 24HRS  Check your site every day for signs of infection. Check for: Redness, swelling, or pain. Fluid or blood. Warmth. Pus or a bad smell.  General instructions Rest and then return to your normal activities as told by your health care provider.  tylenol  as needed for discomfort.   Use narcotics, if prescribed, only when tylenol  and motrin is not enough to control pain.  325-650mg  every 8hrs to max of 3000mg /24hrs (including the 325mg  in every norco dose) for the tylenol .   Resume aspirin  in 48 hours  keep all follow-up visits as told by your health care provider. This is important. Contact a health care provider if: You have redness, swelling, or pain around your site. You have fluid or blood coming from your site. Your site feels warm to the touch. You have pus or a bad smell coming from your site. You have a fever. Your sutures, skin glue, or  adhesive strips loosen or come off sooner than expected. Get help right away if: You have bleeding that does not stop with pressure or a dressing. Summary After the procedure, it is common to have some soreness, bruising, and itching at the site. Follow instructions from your health care provider about how to take care of your site. Check your site every day for signs of infection. Contact a health care provider if you have redness, swelling, or pain around your site, or your site feels warm to the touch. Keep all follow-up visits as told by your health care provider. This is important. This information is not intended to replace advice given to you by your health care provider. Make sure you discuss any questions you have with your health care provider. Document Released: 12/04/2015 Document Revised: 05/07/2018 Document Reviewed: 05/07/2018 Elsevier Interactive Patient Education  [employer].

## 2024-08-29 NOTE — Interval H&P Note (Signed)
 No change. Ok to proceed

## 2024-08-29 NOTE — Anesthesia Preprocedure Evaluation (Addendum)
 Anesthesia Evaluation  Patient identified by MRN, date of birth, ID band Patient awake    Reviewed: Allergy & Precautions, NPO status , Patient's Chart, lab work & pertinent test results  History of Anesthesia Complications Negative for: history of anesthetic complications  Airway Mallampati: I   Neck ROM: Full    Dental  (+) Missing, Implants   Pulmonary asthma    Pulmonary exam normal breath sounds clear to auscultation       Cardiovascular Exercise Tolerance: Good Normal cardiovascular exam Rhythm:Regular Rate:Normal  ECG 04/27/24: SR; low voltage  Echo 10/20/22:  NORMAL LEFT VENTRICULAR SYSTOLIC FUNCTION  NORMAL RIGHT VENTRICULAR SYSTOLIC FUNCTION  MILD VALVULAR REGURGITATION   NO VALVULAR STENOSIS     Neuro/Psych  PSYCHIATRIC DISORDERS  Depression    Vertigo  TIA (04/2024)   GI/Hepatic negative GI ROS,,,  Endo/Other  negative endocrine ROS    Renal/GU      Musculoskeletal  (+) Arthritis ,    Abdominal   Peds  Hematology negative hematology ROS (+)   Anesthesia Other Findings   Reproductive/Obstetrics                              Anesthesia Physical Anesthesia Plan  ASA: 3  Anesthesia Plan: General   Post-op Pain Management:    Induction: Intravenous  PONV Risk Score and Plan: 3 and Ondansetron , Dexamethasone  and Treatment may vary due to age or medical condition  Airway Management Planned: LMA  Additional Equipment:   Intra-op Plan:   Post-operative Plan: Extubation in OR  Informed Consent: I have reviewed the patients History and Physical, chart, labs and discussed the procedure including the risks, benefits and alternatives for the proposed anesthesia with the patient or authorized representative who has indicated his/her understanding and acceptance.     Dental advisory given  Plan Discussed with: CRNA  Anesthesia Plan Comments: (Patient consented for  risks of anesthesia including but not limited to:  - adverse reactions to medications - damage to eyes, teeth, lips or other oral mucosa - nerve damage due to positioning  - sore throat or hoarseness - damage to heart, brain, nerves, lungs, other parts of body or loss of life  Informed patient about role of CRNA in peri- and intra-operative care.  Patient voiced understanding.)         Anesthesia Quick Evaluation

## 2024-08-29 NOTE — Transfer of Care (Signed)
 Immediate Anesthesia Transfer of Care Note  Patient: [name]  Procedure(s) Performed: BREAST LUMPECTOMY WITH RADIO FREQUENCY LOCALIZER (Left) BIOPSY, LYMPH NODE, SENTINEL, AXILLARY (Left: Breast)  Patient Location: PACU  Anesthesia Type:General  Level of Consciousness: awake, alert , and oriented  Airway & Oxygen Therapy: Patient Spontanous Breathing and Patient connected to nasal cannula oxygen  Post-op Assessment: Report given to RN and Post -op Vital signs reviewed and stable  Post vital signs: stable  Last Vitals:  Vitals Value Taken Time  BP 122/104 08/29/24 12:30  Temp    Pulse 63 08/29/24 12:30  Resp 11 08/29/24 12:30  SpO2 96 % 08/29/24 12:30  Vitals shown include unfiled device data.  Last Pain:  Vitals:   08/29/24 1030  TempSrc: Temporal  PainSc:          Complications: No notable events documented.

## 2024-08-30 ENCOUNTER — Encounter: Payer: Self-pay | Admitting: Surgery

## 2024-09-02 LAB — SURGICAL PATHOLOGY

## 2024-09-12 ENCOUNTER — Ambulatory Visit
Admission: RE | Admit: 2024-09-12 | Discharge: 2024-09-12 | Disposition: A | Discharge status: Home / Self Care | Source: Ambulatory Visit | Attending: Radiation Oncology | Admitting: Radiation Oncology

## 2024-09-12 ENCOUNTER — Encounter: Payer: Self-pay | Admitting: *Deleted

## 2024-09-12 ENCOUNTER — Inpatient Hospital Stay (HOSPITAL_BASED_OUTPATIENT_CLINIC_OR_DEPARTMENT_OTHER): Admitting: Oncology

## 2024-09-12 ENCOUNTER — Encounter: Payer: Self-pay | Admitting: Radiation Oncology

## 2024-09-12 VITALS — BP 142/96 | HR 71

## 2024-09-12 VITALS — HR 66 | Temp 96.8°F | Resp 15 | Ht 64.0 in | Wt 175.2 lb

## 2024-09-12 DIAGNOSIS — Z1732 Human epidermal growth factor receptor 2 negative status: Secondary | ICD-10-CM | POA: Insufficient documentation

## 2024-09-12 DIAGNOSIS — I890 Lymphedema, not elsewhere classified: Secondary | ICD-10-CM | POA: Diagnosis not present

## 2024-09-12 DIAGNOSIS — Z1721 Progesterone receptor positive status: Secondary | ICD-10-CM | POA: Diagnosis not present

## 2024-09-12 DIAGNOSIS — R293 Abnormal posture: Secondary | ICD-10-CM | POA: Diagnosis not present

## 2024-09-12 DIAGNOSIS — C50912 Malignant neoplasm of unspecified site of left female breast: Secondary | ICD-10-CM | POA: Insufficient documentation

## 2024-09-12 DIAGNOSIS — Z170 Estrogen receptor positive status [ER+]: Secondary | ICD-10-CM | POA: Diagnosis not present

## 2024-09-12 NOTE — Consult Note (Signed)
 NEW PATIENT EVALUATION  Name: Anita Reilly  MRN: [id number]  Date:   09/12/2024     DOB: 03-09-1946   This 78 y.o. female patient presents to the clinic for initial evaluation of stage Ia (pT1b pN0 M0) invasive mammary carcinoma of the left breast status post wide local excision and sentinel node biopsy.  REFERRING PHYSICIAN: Jacobo Evalene PARAS, MD  CHIEF COMPLAINT:  Chief Complaint  Patient presents with   Breast Cancer    DIAGNOSIS: The encounter diagnosis was Malignant neoplasm of left female breast, unspecified estrogen receptor status, unspecified site of breast (HCC).   PREVIOUS INVESTIGATIONS:  Mammogram and ultrasound reviewed Pathology ports reviewed Clinical notes reviewed  HPI: Patient is a 78 year old female who presented with an abnormal screening mammogram.  This left diagnostic mammogram showing suspicious 5 mm mass in the left breast 12 o'clock position 7 cm to the nipple.  There is also enlarged left axillary lymph nodes.  She also had some subtle architectural distortion near the at area of suspicious malignancy.  She underwent core biopsy which was positive for ER positive PR borderline positive HER2 negative invasive mammary carcinoma overall grade 2 of 3.  1 lymph node showed granulomatous lymphadenitis.  She underwent a wide local excision and sentinel node biopsy for a 6 mm area of invasive ductal carcinoma.  Tumor was close margins were clear but close at 1 mm from the inferior margin.  5 lymph nodes were all negative for carcinoma 2 lymph nodes demonstrated again granulomatous lymphadenitis with necrosis.  Patient is tolerated her surgery well.  Based on the small size of her lesion she is not a candidate for systemic treatment.  She is seen today for radiation oncology opinion.  She states her breast is still somewhat sore in the area of excision.  She otherwise is without complaints.  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    PAST SURGICAL HISTORY:  Past Surgical History:  Procedure Laterality Date   AXILLARY SENTINEL NODE BIOPSY Left 08/29/2024   Procedure: BIOPSY, LYMPH NODE, SENTINEL, AXILLARY;  Surgeon: Tye Millet, DO;  Location: [facility];  Service: General;  Laterality: Left;   BREAST BIOPSY Left 05/06/2021   u/s bx, hydromark, path pending   BREAST BIOPSY Left 07/24/2024   US  LT BREAST BX W LOC DEV 1ST LESION IMG BX SPEC US  GUIDE 07/24/2024 [facility]   BREAST BIOPSY  08/06/2024   MM LT BREAST SAVI/RF TAG EA ADD'L LESION MAMMO GUIDE 08/06/2024 [facility]   BREAST BIOPSY  08/06/2024   MM LT BREAST SAVI/RF TAG 1ST LESION MAMMO GUIDE 08/06/2024 [facility]   BREAST LUMPECTOMY WITH RADIO FREQUENCY LOCALIZER Left 08/29/2024   Procedure: BREAST LUMPECTOMY WITH RADIO FREQUENCY LOCALIZER;  Surgeon: Tye Millet, DO;  Location: [facility];  Service: General;  Laterality: Left;  W/ RF tag   CARPAL TUNNEL RELEASE     left    CATARACT EXTRACTION W/PHACO Right 09/19/2018   Procedure: CATARACT EXTRACTION PHACO AND INTRAOCULAR LENS PLACEMENT (IOC) RIGHT;  Surgeon: Mittie Gaskin, MD;  Location: [facility];  Service: Ophthalmology;  Laterality: Right;  latex sensitivity   CATARACT EXTRACTION W/PHACO Left 10/10/2018   Procedure: CATARACT EXTRACTION PHACO AND INTRAOCULAR LENS PLACEMENT (IOC)  LEFT;  Surgeon: Mittie Gaskin, MD;  Location: [facility];  Service: Ophthalmology;  Laterality: Left;  Latex sensitivity requests arrival around 10 or 10:30   COLONOSCOPY      FAMILY HISTORY: family history is not on file.  SOCIAL HISTORY:  reports that she has never smoked. She has never used smokeless tobacco. She reports that she does  not drink alcohol and does not use drugs.  ALLERGIES: Latex, Sulfa antibiotics, Tylox [oxycodone-acetaminophen ], and Penicillins  MEDICATIONS:  Current Outpatient Medications  Medication Sig Dispense Refill   acetaminophen  (TYLENOL ) 500 MG tablet Take 500 mg by mouth every 6 (six) hours as needed (pain.).     lidocaine -prilocaine (EMLA) cream Apply topically.     aspirin  EC 81 MG tablet Take 81 mg by mouth in the morning. Swallow whole.     atorvastatin  (LIPITOR ) 20 MG tablet Take 20 mg by mouth daily in the afternoon.     Calcium  Carbonate-Vitamin D (CALTRATE 600+D PO) Take 1 tablet by mouth in the morning.     Cyanocobalamin (B-12 PO) Take 2 each by mouth in the morning. Gummies     losartan  (COZAAR ) 50 MG tablet Take 1 tablet (50 mg total) by mouth daily. 30 tablet 0   Multiple Vitamins-Minerals (ADULT ONE DAILY GUMMIES PO) Take 2 tablets by mouth in the morning.     pantoprazole  (PROTONIX ) 40 MG tablet Take 1 tablet (40 mg total) by mouth daily. 30 tablet 0   psyllium (METAMUCIL) 58.6 % packet Take 1 packet by mouth every other day. In the morning     traMADol (ULTRAM) 50 MG tablet Take 1 tablet (50 mg total) by mouth every 8 (eight) hours as needed. 6 tablet 0   No current facility-administered medications for this encounter.    ECOG PERFORMANCE STATUS:  0 - Asymptomatic  REVIEW OF SYSTEMS: Patient denies any weight loss, fatigue, weakness, fever, chills or night sweats. Patient denies any loss of vision, blurred vision. Patient denies any ringing  of the ears or hearing loss. No irregular heartbeat. Patient denies heart murmur or history of fainting. Patient denies any chest pain or pain radiating to her upper extremities. Patient denies any shortness of breath, difficulty breathing at night, cough or hemoptysis. Patient denies any swelling in the lower legs. Patient denies any nausea vomiting, vomiting of blood, or coffee ground material in the vomitus. Patient denies any stomach  pain. Patient states has had normal bowel movements no significant constipation or diarrhea. Patient denies any dysuria, hematuria or significant nocturia. Patient denies any problems walking, swelling in the joints or loss of balance. Patient denies any skin changes, loss of hair or loss of weight. Patient denies any excessive worrying or anxiety or significant depression. Patient denies any problems with insomnia. Patient denies excessive thirst, polyuria, polydipsia. Patient denies any swollen glands, patient denies easy bruising or easy bleeding. Patient denies any recent infections, allergies or URI. Patient s visual fields have not changed significantly in recent time.   PHYSICAL EXAM: Pulse 66   Temp (!) 96.8 F (36 C) (Tympanic)   Resp 15   Ht 5' 4 (1.626 m)   Wt 175 lb 3.2 oz (79.5 kg)   BMI 30.07 kg/m  Patient status post wide local excision of the left breast.  Area is well-healed.  No evidence of seroma is noted.  No dominant masses noted in either breast no axillary or supraclavicular adenopathy is appreciated.  Well-developed well-nourished patient in NAD. HEENT reveals PERLA, EOMI, discs not visualized.  Oral cavity is clear. No oral mucosal lesions are identified.  Neck is clear without evidence of cervical or supraclavicular adenopathy. Lungs are clear to A&P. Cardiac examination is essentially unremarkable with regular rate and rhythm without murmur rub or thrill. Abdomen is benign with no organomegaly or masses noted. Motor sensory and DTR levels are equal and symmetric in the upper and lower extremities. Cranial nerves II through XII are grossly intact. Proprioception is intact. No peripheral adenopathy or edema is identified. No motor or sensory levels are noted. Crude visual fields are within normal range.  LABORATORY DATA: Pathology reports reviewed    RADIOLOGY RESULTS: Mammogram and ultrasound reviewed compatible with above-stated findings   IMPRESSION: Stage Ia ER  positive invasive mammary carcinoma of the left breast status post wide local excision and sentinel node biopsy in 78 year old female  PLAN: At this time I have recommended hypofractionated course of whole breast radiation over 3 weeks.  Based on her close 1 mm margin would also boost her scar another 1000 cGy using photon beam therapy.  Risks and benefits of treatment were reviewed with the patient.  Side effects including skin reaction fatigue alteration blood counts possible inclusion of superficial lung all were reviewed in detail with the patient.  She comprehends my treatment plan well.  Patient also will be a candidate for endocrine therapy after completion of radiation.  I personally set up and ordered CT simulation for next week.  I would like to take this opportunity to thank you for allowing me to participate in the care of your patient.[name], MD

## 2024-09-12 NOTE — Progress Notes (Signed)
 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  INTERVAL HISTORY: Patient returns to clinic today for postop follow-up, discussion of her final pathology results, and treatment planning.  She tolerated her lumpectomy well.  She currently feels well and is asymptomatic.  She has no neurologic complaints.  She denies any recent fevers or illnesses.  She has a good appetite and denies weight loss.  She has no chest pain, shortness of breath, cough, or hemoptysis.  She denies any nausea, vomiting, constipation, or diarrhea.  She has no urinary complaints.  Patient offers no specific complaints today.  REVIEW OF SYSTEMS:   Review of Systems  Constitutional: Negative.  Negative for fever, malaise/fatigue and weight loss.  Respiratory: Negative.  Negative for cough, hemoptysis and shortness of breath.   Cardiovascular: Negative.  Negative for chest pain and leg swelling.  Gastrointestinal: Negative.  Negative for abdominal pain.  Genitourinary: Negative.  Negative for dysuria.  Musculoskeletal: Negative.  Negative for back pain.  Skin: Negative.  Negative for rash.  Neurological: Negative.  Negative for dizziness, focal weakness, weakness and headaches.  Psychiatric/Behavioral: Negative.  The patient is not nervous/anxious.     As per HPI. Otherwise, a complete review of systems is negative.  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   Vertigo    none in several years    PAST SURGICAL HISTORY: Past Surgical History:  Procedure Laterality Date   AXILLARY SENTINEL NODE BIOPSY Left 08/29/2024   Procedure: BIOPSY, LYMPH NODE, SENTINEL, AXILLARY;  Surgeon: Tye Millet, DO;  Location: [facility];  Service: General;  Laterality: Left;   BREAST BIOPSY Left 05/06/2021   u/s bx, hydromark, path pending   BREAST BIOPSY Left 07/24/2024   US  LT BREAST BX W LOC DEV 1ST LESION IMG BX SPEC US  GUIDE 07/24/2024 [facility]   BREAST BIOPSY  08/06/2024   MM LT BREAST SAVI/RF TAG EA ADD'L LESION MAMMO GUIDE 08/06/2024 [facility]   BREAST BIOPSY  08/06/2024   MM LT BREAST SAVI/RF TAG 1ST LESION MAMMO GUIDE 08/06/2024 [facility]   BREAST LUMPECTOMY WITH RADIO FREQUENCY LOCALIZER Left 08/29/2024   Procedure: BREAST LUMPECTOMY WITH RADIO FREQUENCY LOCALIZER;  Surgeon: Tye Millet, DO;  Location: [facility];  Service: General;  Laterality: Left;  W/ RF tag   CARPAL TUNNEL RELEASE     left    CATARACT EXTRACTION W/PHACO Right 09/19/2018   Procedure: CATARACT EXTRACTION PHACO AND INTRAOCULAR LENS PLACEMENT (IOC) RIGHT;  Surgeon: Mittie Gaskin, MD;  Location: [facility];  Service: Ophthalmology;  Laterality: Right;  latex sensitivity   CATARACT EXTRACTION W/PHACO Left 10/10/2018   Procedure: CATARACT EXTRACTION PHACO AND INTRAOCULAR LENS PLACEMENT (IOC)  LEFT;  Surgeon: Mittie Gaskin, MD;  Location: [facility];  Service: Ophthalmology;  Laterality: Left;  Latex sensitivity requests arrival around 10 or 10:30   COLONOSCOPY      FAMILY HISTORY: Family History  Problem Relation Age of Onset  Breast cancer Neg Hx     ADVANCED DIRECTIVES (Y/N):  N  HEALTH MAINTENANCE: Social History   Tobacco Use   Smoking status: Never   Smokeless tobacco: Never  Vaping Use   Vaping status: Never Used  Substance Use Topics   Alcohol use: No   Drug  use: No     Colonoscopy:  PAP:  Bone density:  Lipid panel:  Allergies  Allergen Reactions   Latex     Dental gloves caused lip swelling   Sulfa Antibiotics Swelling    lips   Tylox [Oxycodone-Acetaminophen ]    Penicillins Rash    Veetids caused rash    Current Outpatient Medications  Medication Sig Dispense Refill   acetaminophen  (TYLENOL ) 500 MG tablet Take 500 mg by mouth every 6 (six) hours as needed (pain.).     aspirin  EC 81 MG tablet Take 81 mg by mouth in the morning. Swallow whole.     atorvastatin  (LIPITOR ) 20 MG tablet Take 20 mg by mouth daily in the afternoon.     Calcium  Carbonate-Vitamin D (CALTRATE 600+D PO) Take 1 tablet by mouth in the morning.     Cyanocobalamin (B-12 PO) Take 2 each by mouth in the morning. Gummies     lidocaine -prilocaine (EMLA) cream Apply topically.     losartan  (COZAAR ) 50 MG tablet Take 1 tablet (50 mg total) by mouth daily. 30 tablet 0   Multiple Vitamins-Minerals (ADULT ONE DAILY GUMMIES PO) Take 2 tablets by mouth in the morning.     pantoprazole  (PROTONIX ) 40 MG tablet Take 1 tablet (40 mg total) by mouth daily. 30 tablet 0   psyllium (METAMUCIL) 58.6 % packet Take 1 packet by mouth every other day. In the morning     traMADol (ULTRAM) 50 MG tablet Take 1 tablet (50 mg total) by mouth every 8 (eight) hours as needed. 6 tablet 0   No current facility-administered medications for this visit.    OBJECTIVE: Vitals:   09/12/24 1033  BP: (!) 142/96  Pulse: 71     There is no height or weight on file to calculate BMI.    ECOG FS:0 - Asymptomatic  General: Well-developed, well-nourished, no acute distress. Eyes: Pink conjunctiva, anicteric sclera. HEENT: Normocephalic, moist mucous membranes. Lungs: No audible wheezing or coughing. Heart: Regular rate and rhythm. Abdomen: Soft, nontender, no obvious distention. Musculoskeletal: No edema, cyanosis, or clubbing. Neuro: Alert, answering all questions appropriately. Cranial nerves  grossly intact. Skin: No rashes or petechiae noted. Psych: Normal affect.  LAB RESULTS:  Lab Results  Component Value Date   NA 136 05/06/2024   K 4.4 05/06/2024   CL 102 05/06/2024   CO2 24 05/06/2024   GLUCOSE 100 (H) 05/06/2024   BUN 18 05/06/2024   CREATININE 1.05 (H) 05/06/2024   CALCIUM  9.0 05/06/2024   PROT 6.9 05/06/2024   ALBUMIN 3.4 (L) 05/06/2024   AST 22 05/06/2024   ALT 12 05/06/2024   ALKPHOS 107 05/06/2024   BILITOT 0.3 05/06/2024   GFRNONAA 54 (L) 05/06/2024   GFRAA >90 08/16/2012    Lab Results  Component Value Date   WBC 5.4 05/06/2024   NEUTROABS 2.4 05/06/2024   HGB 13.8 05/06/2024   HCT 40.0 05/06/2024   MCV 92.4 05/06/2024   PLT 360 05/06/2024     STUDIES: MM Breast Surgical Specimen Result Date: 08/29/2024 CLINICAL DATA:  Status post Savi Scout localized LEFT breast lumpectomy. Patient underwent ultrasound-guided biopsy of a LEFT breast mass which demonstrated invasive ductal carcinoma (  IDC). Additional area of architectural distortion was targeted and a SAVI scout was placed at the site. EXAM: SPECIMEN RADIOGRAPH OF THE LEFT BREAST COMPARISON:  Previous exam(s). FINDINGS: Status post excision of the LEFT breast. The 2 Savi Scout reflectors and RIBBON shaped clip are present within the specimen. IMPRESSION: Specimen radiograph of the LEFT breast. Electronically Signed   By: Corean Salter M.D.   On: 08/29/2024 11:36   NM Sentinel Node Inj-No Rpt (Breast) Result Date: 08/29/2024 Lymphoseek was injected by the Nuclear Medicine Technologist for sentinel lymph node localization.    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  The invasive component of the patient's malignancy was only 0.6 cm, therefore Oncotype testing was not necessary.  She also  does not require adjuvant chemotherapy.  She had consultation with radiation oncology today and will proceed with adjuvant XRT.  At the conclusion of XRT patient will return to clinic for further evaluation and discussion of initiation of an aromatase inhibitor.   Bone health: Will get bone marrow density study upon initiating aromatase inhibitor.  I spent a total of 20 minutes reviewing chart data, face-to-face evaluation with the patient, counseling and coordination of care as detailed above.   Patient expressed understanding and was in agreement with this plan. She also understands that She can call clinic at any time with any questions, concerns, or complaints.    Cancer Staging  Invasive ductal carcinoma of left breast [facility]) Staging form: Breast, AJCC 8th Edition - Clinical stage from 07/31/2024: Stage IA (cT1a, cN0, cM0, G2, ER+, PR+, HER2-) - Signed by Jacobo Evalene PARAS, MD on 07/31/2024 Stage prefix: Initial diagnosis Histologic grading system: 3 grade system   Evalene PARAS Jacobo, MD   09/12/2024 10:40 AM

## 2024-09-17 ENCOUNTER — Ambulatory Visit
Admission: RE | Admit: 2024-09-17 | Discharge: 2024-09-17 | Disposition: A | Source: Ambulatory Visit | Attending: Radiation Oncology | Admitting: Radiation Oncology

## 2024-09-17 ENCOUNTER — Encounter: Payer: Self-pay | Admitting: *Deleted

## 2024-09-17 DIAGNOSIS — C50912 Malignant neoplasm of unspecified site of left female breast: Secondary | ICD-10-CM | POA: Diagnosis not present

## 2024-09-18 ENCOUNTER — Inpatient Hospital Stay: Admitting: Occupational Therapy

## 2024-09-18 DIAGNOSIS — R293 Abnormal posture: Secondary | ICD-10-CM

## 2024-09-18 DIAGNOSIS — L905 Scar conditions and fibrosis of skin: Secondary | ICD-10-CM

## 2024-09-18 DIAGNOSIS — C50912 Malignant neoplasm of unspecified site of left female breast: Secondary | ICD-10-CM | POA: Diagnosis not present

## 2024-09-18 NOTE — Therapy (Signed)
 OUTPATIENT OCCUPATIONAL THERAPY BREAST CANCER TREATMENT   Patient Name: Anita Reilly MRN: [id number] DOB:May 08, 1946, 78 y.o., female Today's Date: 09/18/2024  END OF SESSION:  OT End of Session - 09/18/24 1216     Visit Number 2    Number of Visits 5    Date for Recertification  11/06/24    OT Start Time 1040    OT Stop Time 1105    OT Time Calculation (min) 25 min    Activity Tolerance Patient tolerated treatment well    Behavior During Therapy [facility]  for tasks assessed/performed          Past Medical History:  Diagnosis Date   Acute stroke due to ischemia (HCC) 04/27/2024   Arthritis    shoulders   Bell's palsy 2016   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/31/2024   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   Vertigo    none in several years   Past Surgical History:  Procedure Laterality Date   AXILLARY SENTINEL NODE BIOPSY Left 08/29/2024   Procedure: BIOPSY, LYMPH NODE, SENTINEL, AXILLARY;  Surgeon: Tye Millet, DO;  Location: [facility];  Service: General;  Laterality: Left;   BREAST BIOPSY Left 05/06/2021   u/s bx, hydromark, path pending   BREAST BIOPSY Left 07/24/2024   US  LT BREAST BX W LOC DEV 1ST LESION IMG BX SPEC US  GUIDE 07/24/2024 [facility]   BREAST BIOPSY  08/06/2024   MM LT BREAST SAVI/RF TAG EA ADD'L LESION MAMMO GUIDE 08/06/2024 [facility]   BREAST BIOPSY  08/06/2024   MM LT BREAST SAVI/RF TAG 1ST LESION MAMMO GUIDE 08/06/2024 [facility]   BREAST LUMPECTOMY WITH RADIO FREQUENCY LOCALIZER Left 08/29/2024   Procedure: BREAST LUMPECTOMY WITH RADIO FREQUENCY LOCALIZER;  Surgeon: Tye Millet, DO;  Location: [facility];  Service: General;  Laterality: Left;  W/ RF tag   CARPAL TUNNEL RELEASE     left    CATARACT EXTRACTION W/PHACO Right 09/19/2018   Procedure: CATARACT EXTRACTION PHACO AND INTRAOCULAR LENS PLACEMENT (IOC) RIGHT;  Surgeon: Mittie Gaskin, MD;  Location[facility];  Service: Ophthalmology;  Laterality:  Right;  latex sensitivity   CATARACT EXTRACTION W/PHACO Left 10/10/2018   Procedure: CATARACT EXTRACTION PHACO AND INTRAOCULAR LENS PLACEMENT (IOC)  LEFT;  Surgeon: Mittie Gaskin, MD;  Location: [facility];  Service: Ophthalmology;  Laterality: Left;  Latex sensitivity requests arrival around 10 or 10:30   COLONOSCOPY     Patient Active Problem List   Diagnosis Date Noted   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/31/2024   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   Acute stroke due to ischemia [facility]) 04/27/2024   Right sided weakness 08/16/2012   Facial droop 08/16/2012   Bell's palsy 08/16/2012    PCP: Dr Lenon MART PROVIDER: Dr Jacobo  REFERRING DIAG: L breast Cancer  THERAPY DIAG:  Abnormal posture  Scar tissue  Rationale for Evaluation and Treatment: Rehabilitation  ONSET DATE: 07/24/24  SUBJECTIVE:  SUBJECTIVE STATEMENT: I am really doing good.  The exercises you gave me were excellent.  I had my radiation simulation yesterday.  And could not get in the position-and also I feel stronger with some of the exercises you told me to do  PERTINENT HISTORY:  Patient was diagnosed with left  breast cancer - Pt had L lumpectomy by Dr Tye on 08/29/24  PATIENT GOALS:   reduce lymphedema risk and learn post op HEP.   PAIN:  Are you having pain? L breast tender 6/10 above and around the nipple with increased scar tissue where she has some bruising  PRECAUTIONS: Active CA ,L lymphedema    HAND DOMINANCE: right  WEIGHT BEARING RESTRICTIONS: No  FALLS:  Has patient fallen in last 6 months? No  LIVING ENVIRONMENT: Patient lives with: alone  OCCUPATION and LEISURE: Pt retired - prior to light stroke in June 25 pt mow her own lawn and done yardwork - since then  gets tired and still weakness in L side per pt - independent  Has son and daughter in town to assist as needed      OBJECTIVE:  COGNITION: Overall cognitive status: Within functional limits for tasks assessed    POSTURE:  Forward head and rounded shoulders posture- L more than R   UPPER EXTREMITY AROM/PROM: Bilateral UE AROM WFL - 160 flexion and ABD  Ext rotation 80 CERVICAL AROM: All within normal limits:     UPPER EXTREMITY STRENGTH: WFL   LYMPHEDEMA ASSESSMENTS: circumference NT today  L-DEX LYMPHEDEMA SCREENING:  The patient was assessed using the L-Dex machine today to produce a lymphedema index baseline score. The patient will be reassessed on a regular basis (typically every 3 months) to obtain new L-Dex scores. If the score is > 6.5 points away from his/her baseline score indicating onset of subclinical lymphedema, it will be recommended to wear a compression garment for 4 weeks, 12 hours per day and then be reassessed. If the score continues to be > 6.5 points from baseline at reassessment, we will initiate lymphedema treatment. Assessing in this manner has a 95% rate of preventing clinically significant lymphedema.   L-DEX FLOWSHEETS - 09/18/24 1200       L-DEX LYMPHEDEMA SCREENING   Measurement Type Unilateral    L-DEX MEASUREMENT EXTREMITY Upper Extremity    POSITION  Standing    DOMINANT SIDE Right    At Risk Side Left    BASELINE SCORE (UNILATERAL) 4.7    L-DEX SCORE (UNILATERAL) 2.8    VALUE CHANGE (UNILAT) -1.9            SESSION 09/18/24: Patient arrive with great progress in active range of motion for shoulder flexion abduction and external rotation on the left.  Slight pull over the lateral and superior left breast with endrange shoulder flexion abduction.  Patient with some scar tissue and tenderness above and around the nipple.  Where she had some bruising.  Fabricated patient Chip bag to use 3 to 4 hours a day ultra tolerance to decrease fibrosis  and scar tissue and can attempt in a few days to do some gentle manual massage.  6/10 tenderness.  Patient guarding it. Educate patient on doing active assisted range of motion for shoulder flexion abduction using the wall 10 reps daily prior to radiation. L-Dex score within normal range.   PATIENT EDUCATION:  Education details: Lymphedema risk reduction and post op shoulder/posture HEP Person educated: Patient Education method: Explanation, Demonstration, Handout Education comprehension: Patient verbalized understanding  and returned demonstration  ASSESSMENT:  CLINICAL IMPRESSION: Patient had left lumpectomy on 08/29/2024 by Dr. Tye.  Patient arrived with great progress in shoulder range of motion.  Still slight pulling discomfort over lateral breast with endrange shoulder flexion abduction.  Patient had her radiation simulation yesterday.  Reviewed with patient to do active assisted range of motion for shoulder flexion and abduction using the wall 10 reps each prior to radiation every day.  Provided patient with chip bag she will benefit from  Follow up after radiation -  and from L-Dex screens and there after every 3 months for 2 years to detect subclinical lymphedema.  Pt will benefit from skilled therapeutic intervention to improve on the following deficits: Decreased knowledge of precautions and lymphedema education, impaired UE functional use, pain, decreased ROM, postural dysfunction.   OT treatment/interventions: ADL/self-care home management, pt/family education, therapeutic exercise,manual therapy  REHAB POTENTIAL: Good  CLINICAL DECISION MAKING: Stable/uncomplicated  EVALUATION COMPLEXITY: Low   GOALS: Goals reviewed with patient? YES  LONG TERM GOALS: (STG=LTG)    Name Target Date Goal status  1 Pt will be able to verbalize understanding of pertinent lymphedema risk reduction practices relevant to her dx specifically related to skin care.  Baseline:  No knowledge 6  weeks Initial  2 Pt will be able to return demo and/or verbalize understanding of the post op HEP related to regaining shoulder ROM. Baseline:  No knowledge Today Achieved at eval       4 Pt will demo she has regained full shoulder ROM and function post operatively compared to baselines.  Baseline: See objective measurements taken today. 12 weeks Initial    PLAN:  OT FREQUENCY/DURATION: EVAL and 1-4 follow up appointments for 12 weeks.   PLAN FOR NEXT SESSION: will reassess 2-3  weeks post op to determine needs.   Patient will follow up at outpatient cancer rehab 3-4 weeks following surgery.  If the patient requires occupational therapy at that time, a specific plan will be dictated and sent to the referring physician for approval.  Occupational Therapy Information for After Breast Cancer Surgery/Treatment:  Lymphedema is a swelling condition that you may be at risk for in your arm if you have lymph nodes removed from the armpit area.  After a sentinel node biopsy, the risk is approximately 5-9% and is higher after an axillary node dissection.  There is treatment available for this condition and it is not life-threatening.  Contact your physician or occupational therapist with concerns. You may begin the 4 shoulder/posture exercises (see additional sheet) when permitted by your physician (typically a week after surgery).  If you have drains, you may need to wait until those are removed before beginning range of motion exercises.  A general recommendation is to not lift your arms above shoulder height until drains are removed.  These exercises should be done to your tolerance and gently.  This is not a no pain/no gain type of recovery so listen to your body and stretch into the range of motion that you can tolerate, stopping if you have pain.  If you are having immediate reconstruction, ask your plastic surgeon about doing exercises as he or she may want you to wait. .  While undergoing any  medical procedure or treatment, try to avoid blood pressure being taken or needle sticks from occurring on the arm on the side of cancer.   This recommendation begins after surgery and continues for the rest of your life.  This may help reduce  your risk of getting lymphedema (swelling in your arm). An excellent resource for those seeking information on lymphedema is the National Lymphedema Network's web site. It can be accessed at www.lymphnet.org If you notice swelling in your hand, arm or breast at any time following surgery (even if it is many years from now), please contact your doctor or occupational therapist to discuss this.  Lymphedema can be treated at any time but it is easier for you if it is treated early on.  If you feel like your shoulder motion is not returning to normal in a reasonable amount of time, please contact your surgeon or occupational therapist.  [facility] [phone]. [address], [place], [place] [address]       Ancel Peters, OTR/L,CLT 09/18/2024, 4:08 PM

## 2024-09-20 ENCOUNTER — Other Ambulatory Visit: Payer: Self-pay | Admitting: *Deleted

## 2024-09-20 DIAGNOSIS — C50912 Malignant neoplasm of unspecified site of left female breast: Secondary | ICD-10-CM

## 2024-09-24 DIAGNOSIS — R293 Abnormal posture: Secondary | ICD-10-CM | POA: Insufficient documentation

## 2024-09-24 DIAGNOSIS — Z170 Estrogen receptor positive status [ER+]: Secondary | ICD-10-CM | POA: Insufficient documentation

## 2024-09-24 DIAGNOSIS — Z1721 Progesterone receptor positive status: Secondary | ICD-10-CM | POA: Insufficient documentation

## 2024-09-24 DIAGNOSIS — Z1732 Human epidermal growth factor receptor 2 negative status: Secondary | ICD-10-CM | POA: Insufficient documentation

## 2024-09-24 DIAGNOSIS — I890 Lymphedema, not elsewhere classified: Secondary | ICD-10-CM | POA: Insufficient documentation

## 2024-09-24 DIAGNOSIS — C50912 Malignant neoplasm of unspecified site of left female breast: Secondary | ICD-10-CM | POA: Insufficient documentation

## 2024-09-25 ENCOUNTER — Ambulatory Visit
Admission: RE | Admit: 2024-09-25 | Discharge: 2024-09-25 | Disposition: A | Discharge status: Home / Self Care | Source: Ambulatory Visit | Attending: Radiation Oncology | Admitting: Radiation Oncology

## 2024-09-26 ENCOUNTER — Other Ambulatory Visit: Payer: Self-pay

## 2024-09-26 ENCOUNTER — Ambulatory Visit
Admission: RE | Admit: 2024-09-26 | Discharge: 2024-09-26 | Disposition: A | Source: Ambulatory Visit | Attending: Radiation Oncology | Admitting: Radiation Oncology

## 2024-09-26 LAB — RAD ONC ARIA SESSION SUMMARY
Course Elapsed Days: 0
Plan Fractions Treated to Date: 1
Plan Prescribed Dose Per Fraction: 2.66 Gy
Plan Total Fractions Prescribed: 16
Plan Total Prescribed Dose: 42.56 Gy
Reference Point Dosage Given to Date: 2.66 Gy
Reference Point Session Dosage Given: 2.66 Gy
Session Number: 1

## 2024-09-27 ENCOUNTER — Other Ambulatory Visit: Payer: Self-pay

## 2024-09-27 ENCOUNTER — Ambulatory Visit
Admission: RE | Admit: 2024-09-27 | Discharge: 2024-09-27 | Disposition: A | Discharge status: Home / Self Care | Source: Ambulatory Visit | Attending: Radiation Oncology | Admitting: Radiation Oncology

## 2024-09-27 LAB — RAD ONC ARIA SESSION SUMMARY
Course Elapsed Days: 1
Plan Fractions Treated to Date: 2
Plan Prescribed Dose Per Fraction: 2.66 Gy
Plan Total Fractions Prescribed: 16
Plan Total Prescribed Dose: 42.56 Gy
Reference Point Dosage Given to Date: 5.32 Gy
Reference Point Session Dosage Given: 2.66 Gy
Session Number: 2

## 2024-09-30 ENCOUNTER — Ambulatory Visit
Admission: RE | Admit: 2024-09-30 | Discharge: 2024-09-30 | Disposition: A | Discharge status: Home / Self Care | Source: Ambulatory Visit | Attending: Radiation Oncology | Admitting: Radiation Oncology

## 2024-09-30 ENCOUNTER — Other Ambulatory Visit: Payer: Self-pay

## 2024-09-30 LAB — RAD ONC ARIA SESSION SUMMARY
Course Elapsed Days: 4
Plan Fractions Treated to Date: 3
Plan Prescribed Dose Per Fraction: 2.66 Gy
Plan Total Fractions Prescribed: 16
Plan Total Prescribed Dose: 42.56 Gy
Reference Point Dosage Given to Date: 7.98 Gy
Reference Point Session Dosage Given: 2.66 Gy
Session Number: 3

## 2024-10-01 ENCOUNTER — Other Ambulatory Visit: Payer: Self-pay

## 2024-10-01 ENCOUNTER — Ambulatory Visit
Admission: RE | Admit: 2024-10-01 | Discharge: 2024-10-01 | Disposition: A | Discharge status: Home / Self Care | Source: Ambulatory Visit | Attending: Radiation Oncology | Admitting: Radiation Oncology

## 2024-10-01 LAB — RAD ONC ARIA SESSION SUMMARY
Course Elapsed Days: 5
Plan Fractions Treated to Date: 4
Plan Prescribed Dose Per Fraction: 2.66 Gy
Plan Total Fractions Prescribed: 16
Plan Total Prescribed Dose: 42.56 Gy
Reference Point Dosage Given to Date: 10.64 Gy
Reference Point Session Dosage Given: 2.66 Gy
Session Number: 4

## 2024-10-02 ENCOUNTER — Ambulatory Visit
Admission: RE | Admit: 2024-10-02 | Discharge: 2024-10-02 | Disposition: A | Discharge status: Home / Self Care | Source: Ambulatory Visit | Attending: Radiation Oncology | Admitting: Radiation Oncology

## 2024-10-02 ENCOUNTER — Other Ambulatory Visit: Payer: Self-pay

## 2024-10-02 LAB — RAD ONC ARIA SESSION SUMMARY
Course Elapsed Days: 6
Plan Fractions Treated to Date: 5
Plan Prescribed Dose Per Fraction: 2.66 Gy
Plan Total Fractions Prescribed: 16
Plan Total Prescribed Dose: 42.56 Gy
Reference Point Dosage Given to Date: 13.3 Gy
Reference Point Session Dosage Given: 2.66 Gy
Session Number: 5

## 2024-10-03 ENCOUNTER — Ambulatory Visit
Admission: RE | Admit: 2024-10-03 | Discharge: 2024-10-03 | Disposition: A | Discharge status: Home / Self Care | Source: Ambulatory Visit | Attending: Radiation Oncology | Admitting: Radiation Oncology

## 2024-10-03 ENCOUNTER — Inpatient Hospital Stay: Attending: Oncology

## 2024-10-03 ENCOUNTER — Other Ambulatory Visit: Payer: Self-pay

## 2024-10-03 DIAGNOSIS — Z1732 Human epidermal growth factor receptor 2 negative status: Secondary | ICD-10-CM | POA: Insufficient documentation

## 2024-10-03 DIAGNOSIS — Z170 Estrogen receptor positive status [ER+]: Secondary | ICD-10-CM | POA: Insufficient documentation

## 2024-10-03 DIAGNOSIS — C50912 Malignant neoplasm of unspecified site of left female breast: Secondary | ICD-10-CM | POA: Insufficient documentation

## 2024-10-03 DIAGNOSIS — Z1721 Progesterone receptor positive status: Secondary | ICD-10-CM | POA: Insufficient documentation

## 2024-10-03 LAB — RAD ONC ARIA SESSION SUMMARY
Course Elapsed Days: 7
Plan Fractions Treated to Date: 6
Plan Prescribed Dose Per Fraction: 2.66 Gy
Plan Total Fractions Prescribed: 16
Plan Total Prescribed Dose: 42.56 Gy
Reference Point Dosage Given to Date: 15.96 Gy
Reference Point Session Dosage Given: 2.66 Gy
Session Number: 6

## 2024-10-03 LAB — CBC (CANCER CENTER ONLY)
HCT: 40.2 % (ref 36.0–46.0)
Hemoglobin: 13.7 g/dL (ref 12.0–15.0)
MCH: 31 pg (ref 26.0–34.0)
MCHC: 34.1 g/dL (ref 30.0–36.0)
MCV: 91 fL (ref 80.0–100.0)
Platelet Count: 243 K/uL (ref 150–400)
RBC: 4.42 MIL/uL (ref 3.87–5.11)
RDW: 12.5 % (ref 11.5–15.5)
WBC Count: 4.9 K/uL (ref 4.0–10.5)
nRBC: 0 % (ref 0.0–0.2)

## 2024-10-04 ENCOUNTER — Ambulatory Visit
Admission: RE | Admit: 2024-10-04 | Discharge: 2024-10-04 | Disposition: A | Discharge status: Home / Self Care | Source: Ambulatory Visit | Attending: Radiation Oncology | Admitting: Radiation Oncology

## 2024-10-04 ENCOUNTER — Other Ambulatory Visit: Payer: Self-pay

## 2024-10-04 LAB — RAD ONC ARIA SESSION SUMMARY
Course Elapsed Days: 8
Plan Fractions Treated to Date: 7
Plan Prescribed Dose Per Fraction: 2.66 Gy
Plan Total Fractions Prescribed: 16
Plan Total Prescribed Dose: 42.56 Gy
Reference Point Dosage Given to Date: 18.62 Gy
Reference Point Session Dosage Given: 2.66 Gy
Session Number: 7

## 2024-10-07 ENCOUNTER — Ambulatory Visit
Admission: RE | Admit: 2024-10-07 | Discharge: 2024-10-07 | Disposition: A | Discharge status: Home / Self Care | Source: Ambulatory Visit | Attending: Radiation Oncology | Admitting: Radiation Oncology

## 2024-10-07 ENCOUNTER — Other Ambulatory Visit: Payer: Self-pay

## 2024-10-07 LAB — RAD ONC ARIA SESSION SUMMARY
Course Elapsed Days: 11
Plan Fractions Treated to Date: 8
Plan Prescribed Dose Per Fraction: 2.66 Gy
Plan Total Fractions Prescribed: 16
Plan Total Prescribed Dose: 42.56 Gy
Reference Point Dosage Given to Date: 21.28 Gy
Reference Point Session Dosage Given: 2.66 Gy
Session Number: 8

## 2024-10-08 ENCOUNTER — Ambulatory Visit
Admission: RE | Admit: 2024-10-08 | Discharge: 2024-10-08 | Disposition: A | Source: Ambulatory Visit | Attending: Radiation Oncology | Admitting: Radiation Oncology

## 2024-10-08 ENCOUNTER — Other Ambulatory Visit: Payer: Self-pay

## 2024-10-08 LAB — RAD ONC ARIA SESSION SUMMARY
Course Elapsed Days: 12
Plan Fractions Treated to Date: 9
Plan Prescribed Dose Per Fraction: 2.66 Gy
Plan Total Fractions Prescribed: 16
Plan Total Prescribed Dose: 42.56 Gy
Reference Point Dosage Given to Date: 23.94 Gy
Reference Point Session Dosage Given: 2.66 Gy
Session Number: 9

## 2024-10-09 ENCOUNTER — Ambulatory Visit
Admission: RE | Admit: 2024-10-09 | Discharge: 2024-10-09 | Disposition: A | Discharge status: Home / Self Care | Source: Ambulatory Visit | Attending: Radiation Oncology | Admitting: Radiation Oncology

## 2024-10-09 ENCOUNTER — Other Ambulatory Visit: Payer: Self-pay

## 2024-10-09 LAB — RAD ONC ARIA SESSION SUMMARY
Course Elapsed Days: 13
Plan Fractions Treated to Date: 10
Plan Prescribed Dose Per Fraction: 2.66 Gy
Plan Total Fractions Prescribed: 16
Plan Total Prescribed Dose: 42.56 Gy
Reference Point Dosage Given to Date: 26.6 Gy
Reference Point Session Dosage Given: 2.66 Gy
Session Number: 10

## 2024-10-10 ENCOUNTER — Ambulatory Visit
Admission: RE | Admit: 2024-10-10 | Discharge: 2024-10-10 | Disposition: A | Source: Ambulatory Visit | Attending: Radiation Oncology | Admitting: Radiation Oncology

## 2024-10-10 ENCOUNTER — Other Ambulatory Visit: Payer: Self-pay

## 2024-10-10 LAB — RAD ONC ARIA SESSION SUMMARY
Course Elapsed Days: 14
Plan Fractions Treated to Date: 11
Plan Prescribed Dose Per Fraction: 2.66 Gy
Plan Total Fractions Prescribed: 16
Plan Total Prescribed Dose: 42.56 Gy
Reference Point Dosage Given to Date: 29.26 Gy
Reference Point Session Dosage Given: 2.66 Gy
Session Number: 11

## 2024-10-11 ENCOUNTER — Ambulatory Visit
Admission: RE | Admit: 2024-10-11 | Discharge: 2024-10-11 | Disposition: A | Discharge status: Home / Self Care | Source: Ambulatory Visit | Attending: Radiation Oncology | Admitting: Radiation Oncology

## 2024-10-11 ENCOUNTER — Other Ambulatory Visit: Payer: Self-pay

## 2024-10-11 LAB — RAD ONC ARIA SESSION SUMMARY
Course Elapsed Days: 15
Plan Fractions Treated to Date: 12
Plan Prescribed Dose Per Fraction: 2.66 Gy
Plan Total Fractions Prescribed: 16
Plan Total Prescribed Dose: 42.56 Gy
Reference Point Dosage Given to Date: 31.92 Gy
Reference Point Session Dosage Given: 2.66 Gy
Session Number: 12

## 2024-10-14 ENCOUNTER — Other Ambulatory Visit: Payer: Self-pay

## 2024-10-14 ENCOUNTER — Ambulatory Visit
Admission: RE | Admit: 2024-10-14 | Discharge: 2024-10-14 | Disposition: A | Source: Ambulatory Visit | Attending: Radiation Oncology | Admitting: Radiation Oncology

## 2024-10-14 LAB — RAD ONC ARIA SESSION SUMMARY
Course Elapsed Days: 18
Plan Fractions Treated to Date: 13
Plan Prescribed Dose Per Fraction: 2.66 Gy
Plan Total Fractions Prescribed: 16
Plan Total Prescribed Dose: 42.56 Gy
Reference Point Dosage Given to Date: 34.58 Gy
Reference Point Session Dosage Given: 2.66 Gy
Session Number: 13

## 2024-10-15 ENCOUNTER — Ambulatory Visit
Admission: RE | Admit: 2024-10-15 | Discharge: 2024-10-15 | Disposition: A | Discharge status: Home / Self Care | Source: Ambulatory Visit | Attending: Radiation Oncology | Admitting: Radiation Oncology

## 2024-10-15 ENCOUNTER — Ambulatory Visit

## 2024-10-15 ENCOUNTER — Other Ambulatory Visit: Payer: Self-pay

## 2024-10-15 LAB — RAD ONC ARIA SESSION SUMMARY
Course Elapsed Days: 19
Plan Fractions Treated to Date: 14
Plan Prescribed Dose Per Fraction: 2.66 Gy
Plan Total Fractions Prescribed: 16
Plan Total Prescribed Dose: 42.56 Gy
Reference Point Dosage Given to Date: 37.24 Gy
Reference Point Session Dosage Given: 2.66 Gy
Session Number: 14

## 2024-10-16 ENCOUNTER — Other Ambulatory Visit: Payer: Self-pay

## 2024-10-16 ENCOUNTER — Ambulatory Visit
Admission: RE | Admit: 2024-10-16 | Discharge: 2024-10-16 | Disposition: A | Source: Ambulatory Visit | Attending: Radiation Oncology | Admitting: Radiation Oncology

## 2024-10-16 ENCOUNTER — Inpatient Hospital Stay

## 2024-10-16 DIAGNOSIS — C50912 Malignant neoplasm of unspecified site of left female breast: Secondary | ICD-10-CM

## 2024-10-16 LAB — RAD ONC ARIA SESSION SUMMARY
Course Elapsed Days: 20
Plan Fractions Treated to Date: 15
Plan Prescribed Dose Per Fraction: 2.66 Gy
Plan Total Fractions Prescribed: 16
Plan Total Prescribed Dose: 42.56 Gy
Reference Point Dosage Given to Date: 39.9 Gy
Reference Point Session Dosage Given: 2.66 Gy
Session Number: 15

## 2024-10-16 LAB — CBC (CANCER CENTER ONLY)
HCT: 39.2 % (ref 36.0–46.0)
Hemoglobin: 13.4 g/dL (ref 12.0–15.0)
MCH: 31.1 pg (ref 26.0–34.0)
MCHC: 34.2 g/dL (ref 30.0–36.0)
MCV: 91 fL (ref 80.0–100.0)
Platelet Count: 219 K/uL (ref 150–400)
RBC: 4.31 MIL/uL (ref 3.87–5.11)
RDW: 12.5 % (ref 11.5–15.5)
WBC Count: 3.9 K/uL — ABNORMAL LOW (ref 4.0–10.5)
nRBC: 0 % (ref 0.0–0.2)

## 2024-10-21 ENCOUNTER — Ambulatory Visit
Admission: RE | Admit: 2024-10-21 | Discharge: 2024-10-21 | Disposition: A | Source: Ambulatory Visit | Attending: Radiation Oncology | Admitting: Radiation Oncology

## 2024-10-21 ENCOUNTER — Other Ambulatory Visit: Payer: Self-pay

## 2024-10-21 DIAGNOSIS — Z1721 Progesterone receptor positive status: Secondary | ICD-10-CM | POA: Diagnosis not present

## 2024-10-21 DIAGNOSIS — Z1732 Human epidermal growth factor receptor 2 negative status: Secondary | ICD-10-CM | POA: Insufficient documentation

## 2024-10-21 DIAGNOSIS — R293 Abnormal posture: Secondary | ICD-10-CM | POA: Diagnosis not present

## 2024-10-21 DIAGNOSIS — Z170 Estrogen receptor positive status [ER+]: Secondary | ICD-10-CM | POA: Diagnosis not present

## 2024-10-21 DIAGNOSIS — I890 Lymphedema, not elsewhere classified: Secondary | ICD-10-CM | POA: Diagnosis not present

## 2024-10-21 DIAGNOSIS — C50912 Malignant neoplasm of unspecified site of left female breast: Secondary | ICD-10-CM | POA: Diagnosis present

## 2024-10-21 LAB — RAD ONC ARIA SESSION SUMMARY
Course Elapsed Days: 25
Plan Fractions Treated to Date: 16
Plan Prescribed Dose Per Fraction: 2.66 Gy
Plan Total Fractions Prescribed: 16
Plan Total Prescribed Dose: 42.56 Gy
Reference Point Dosage Given to Date: 42.56 Gy
Reference Point Session Dosage Given: 2.66 Gy
Session Number: 16

## 2024-10-22 ENCOUNTER — Ambulatory Visit
Admission: RE | Admit: 2024-10-22 | Discharge: 2024-10-22 | Disposition: A | Source: Ambulatory Visit | Attending: Radiation Oncology | Admitting: Radiation Oncology

## 2024-10-22 ENCOUNTER — Other Ambulatory Visit: Payer: Self-pay

## 2024-10-22 DIAGNOSIS — C50912 Malignant neoplasm of unspecified site of left female breast: Secondary | ICD-10-CM | POA: Diagnosis not present

## 2024-10-22 LAB — RAD ONC ARIA SESSION SUMMARY
Course Elapsed Days: 26
Plan Fractions Treated to Date: 1
Plan Prescribed Dose Per Fraction: 2 Gy
Plan Total Fractions Prescribed: 5
Plan Total Prescribed Dose: 10 Gy
Reference Point Dosage Given to Date: 2 Gy
Reference Point Session Dosage Given: 2 Gy
Session Number: 17

## 2024-10-23 ENCOUNTER — Ambulatory Visit
Admission: RE | Admit: 2024-10-23 | Discharge: 2024-10-23 | Disposition: A | Source: Ambulatory Visit | Attending: Radiation Oncology | Admitting: Radiation Oncology

## 2024-10-23 ENCOUNTER — Other Ambulatory Visit: Payer: Self-pay

## 2024-10-23 DIAGNOSIS — C50912 Malignant neoplasm of unspecified site of left female breast: Secondary | ICD-10-CM | POA: Diagnosis not present

## 2024-10-23 LAB — RAD ONC ARIA SESSION SUMMARY
Course Elapsed Days: 27
Plan Fractions Treated to Date: 2
Plan Prescribed Dose Per Fraction: 2 Gy
Plan Total Fractions Prescribed: 5
Plan Total Prescribed Dose: 10 Gy
Reference Point Dosage Given to Date: 4 Gy
Reference Point Session Dosage Given: 2 Gy
Session Number: 18

## 2024-10-24 ENCOUNTER — Other Ambulatory Visit: Payer: Self-pay

## 2024-10-24 ENCOUNTER — Ambulatory Visit
Admission: RE | Admit: 2024-10-24 | Discharge: 2024-10-24 | Disposition: A | Source: Ambulatory Visit | Attending: Radiation Oncology | Admitting: Radiation Oncology

## 2024-10-24 DIAGNOSIS — C50912 Malignant neoplasm of unspecified site of left female breast: Secondary | ICD-10-CM | POA: Diagnosis not present

## 2024-10-24 LAB — RAD ONC ARIA SESSION SUMMARY
Course Elapsed Days: 28
Plan Fractions Treated to Date: 3
Plan Prescribed Dose Per Fraction: 2 Gy
Plan Total Fractions Prescribed: 5
Plan Total Prescribed Dose: 10 Gy
Reference Point Dosage Given to Date: 6 Gy
Reference Point Session Dosage Given: 2 Gy
Session Number: 19

## 2024-10-25 ENCOUNTER — Other Ambulatory Visit: Payer: Self-pay

## 2024-10-25 ENCOUNTER — Ambulatory Visit
Admission: RE | Admit: 2024-10-25 | Discharge: 2024-10-25 | Disposition: A | Source: Ambulatory Visit | Attending: Radiation Oncology | Admitting: Radiation Oncology

## 2024-10-25 DIAGNOSIS — C50912 Malignant neoplasm of unspecified site of left female breast: Secondary | ICD-10-CM | POA: Diagnosis not present

## 2024-10-25 LAB — RAD ONC ARIA SESSION SUMMARY
Course Elapsed Days: 29
Plan Fractions Treated to Date: 4
Plan Prescribed Dose Per Fraction: 2 Gy
Plan Total Fractions Prescribed: 5
Plan Total Prescribed Dose: 10 Gy
Reference Point Dosage Given to Date: 8 Gy
Reference Point Session Dosage Given: 2 Gy
Session Number: 20

## 2024-10-28 ENCOUNTER — Ambulatory Visit
Admission: RE | Admit: 2024-10-28 | Discharge: 2024-10-28 | Disposition: A | Source: Ambulatory Visit | Attending: Radiation Oncology | Admitting: Radiation Oncology

## 2024-10-28 ENCOUNTER — Other Ambulatory Visit: Payer: Self-pay

## 2024-10-28 ENCOUNTER — Inpatient Hospital Stay: Attending: Oncology | Admitting: Oncology

## 2024-10-28 ENCOUNTER — Encounter: Payer: Self-pay | Admitting: Oncology

## 2024-10-28 ENCOUNTER — Encounter: Payer: Self-pay | Admitting: *Deleted

## 2024-10-28 VITALS — BP 133/90 | HR 66 | Temp 97.2°F | Resp 18 | Ht 64.0 in | Wt 180.0 lb

## 2024-10-28 DIAGNOSIS — Z923 Personal history of irradiation: Secondary | ICD-10-CM | POA: Insufficient documentation

## 2024-10-28 DIAGNOSIS — C50912 Malignant neoplasm of unspecified site of left female breast: Secondary | ICD-10-CM | POA: Diagnosis not present

## 2024-10-28 DIAGNOSIS — Z1732 Human epidermal growth factor receptor 2 negative status: Secondary | ICD-10-CM | POA: Insufficient documentation

## 2024-10-28 DIAGNOSIS — Z79811 Long term (current) use of aromatase inhibitors: Secondary | ICD-10-CM | POA: Insufficient documentation

## 2024-10-28 DIAGNOSIS — Z1721 Progesterone receptor positive status: Secondary | ICD-10-CM | POA: Insufficient documentation

## 2024-10-28 DIAGNOSIS — Z170 Estrogen receptor positive status [ER+]: Secondary | ICD-10-CM | POA: Insufficient documentation

## 2024-10-28 LAB — RAD ONC ARIA SESSION SUMMARY
Course Elapsed Days: 32
Plan Fractions Treated to Date: 5
Plan Prescribed Dose Per Fraction: 2 Gy
Plan Total Fractions Prescribed: 5
Plan Total Prescribed Dose: 10 Gy
Reference Point Dosage Given to Date: 10 Gy
Reference Point Session Dosage Given: 2 Gy
Session Number: 21

## 2024-10-28 MED ORDER — LETROZOLE 2.5 MG PO TABS: 2.5000 mg | ORAL_TABLET | Freq: Every day | ORAL | 3 refills | Status: AC

## 2024-10-28 NOTE — Progress Notes (Signed)
 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  INTERVAL HISTORY: Patient returns to clinic today at the conclusion of her XRT for further evaluation and initiation of letrozole .  She tolerated her treatments well without significant side effects.  She currently feels well and is asymptomatic.  She has no neurologic complaints.  She denies any recent fevers or illnesses.  She has a good appetite and denies weight loss.  She has no chest pain, shortness of breath, cough, or hemoptysis.  She denies any nausea, vomiting, constipation, or diarrhea.  She has no urinary complaints.  Patient offers no specific complaints today.  REVIEW OF SYSTEMS:   Review of Systems  Constitutional: Negative.  Negative for fever, malaise/fatigue and weight loss.  Respiratory: Negative.  Negative for cough, hemoptysis and shortness of breath.   Cardiovascular: Negative.  Negative for chest pain and leg swelling.  Gastrointestinal: Negative.  Negative for abdominal pain.  Genitourinary: Negative.  Negative for dysuria.  Musculoskeletal: Negative.  Negative for back pain.  Skin: Negative.  Negative for rash.  Neurological: Negative.  Negative for dizziness, focal weakness, weakness and headaches.  Psychiatric/Behavioral: Negative.  The patient is not nervous/anxious.     As per HPI. Otherwise, a complete review of systems is negative.  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   Vertigo    none in several years    PAST SURGICAL HISTORY: Past Surgical History:  Procedure Laterality Date   AXILLARY SENTINEL NODE BIOPSY Left 08/29/2024   Procedure: BIOPSY, LYMPH NODE, SENTINEL, AXILLARY;  Surgeon: Tye Millet, DO;  Location: [facility];  Service: General;  Laterality: Left;   BREAST BIOPSY Left 05/06/2021   u/s bx, hydromark, path pending   BREAST BIOPSY Left 07/24/2024   US  LT BREAST BX W LOC DEV 1ST LESION IMG BX SPEC US  GUIDE 07/24/2024 [facility]   BREAST BIOPSY  08/06/2024   MM LT BREAST SAVI/RF TAG EA ADD'L LESION MAMMO GUIDE 08/06/2024 [facility]   BREAST BIOPSY  08/06/2024   MM LT BREAST SAVI/RF TAG 1ST LESION MAMMO GUIDE 08/06/2024 [facility]   BREAST LUMPECTOMY WITH RADIO FREQUENCY LOCALIZER Left 08/29/2024   Procedure: BREAST LUMPECTOMY WITH RADIO FREQUENCY LOCALIZER;  Surgeon: Tye Millet, DO;  Location: [facility];  Service: General;  Laterality: Left;  W/ RF tag   CARPAL TUNNEL RELEASE     left    CATARACT EXTRACTION W/PHACO Right 09/19/2018   Procedure: CATARACT EXTRACTION PHACO AND INTRAOCULAR LENS PLACEMENT (IOC) RIGHT;  Surgeon: Mittie Gaskin, MD;  Location: [facility];  Service: Ophthalmology;  Laterality: Right;  latex sensitivity   CATARACT EXTRACTION W/PHACO Left 10/10/2018   Procedure: CATARACT EXTRACTION PHACO AND INTRAOCULAR LENS PLACEMENT (IOC)  LEFT;  Surgeon: Mittie Gaskin, MD;  Location: [facility];  Service: Ophthalmology;  Laterality: Left;  Latex sensitivity requests arrival around 10 or 10:30   COLONOSCOPY  FAMILY HISTORY: Family History  Problem Relation Age of Onset   Breast cancer Neg Hx     ADVANCED DIRECTIVES (Y/N):  N  HEALTH MAINTENANCE: Social History   Tobacco Use   Smoking status: Never   Smokeless tobacco: Never  Vaping  Use   Vaping status: Never Used  Substance Use Topics   Alcohol use: No   Drug use: No     Colonoscopy:  PAP:  Bone density:  Lipid panel:  Allergies  Allergen Reactions   Latex     Dental gloves caused lip swelling   Sulfa Antibiotics Swelling    lips   Tylox [Oxycodone-Acetaminophen ]    Penicillins Rash    Veetids caused rash    Current Outpatient Medications  Medication Sig Dispense Refill   acetaminophen  (TYLENOL ) 500 MG tablet Take 500 mg by mouth every 6 (six) hours as needed (pain.).     aspirin  EC 81 MG tablet Take 81 mg by mouth in the morning. Swallow whole.     atorvastatin  (LIPITOR ) 20 MG tablet Take 20 mg by mouth daily in the afternoon.     Calcium  Carbonate-Vitamin D (CALTRATE 600+D PO) Take 1 tablet by mouth in the morning.     Cyanocobalamin (B-12 PO) Take 2 each by mouth in the morning. Gummies     letrozole  (FEMARA ) 2.5 MG tablet Take 1 tablet (2.5 mg total) by mouth daily. 30 tablet 3   lidocaine -prilocaine (EMLA) cream Apply topically.     losartan  (COZAAR ) 50 MG tablet Take 1 tablet (50 mg total) by mouth daily. 30 tablet 0   Multiple Vitamins-Minerals (ADULT ONE DAILY GUMMIES PO) Take 2 tablets by mouth in the morning.     pantoprazole  (PROTONIX ) 40 MG tablet Take 1 tablet (40 mg total) by mouth daily. 30 tablet 0   psyllium (METAMUCIL) 58.6 % packet Take 1 packet by mouth every other day. In the morning     traMADol  (ULTRAM ) 50 MG tablet Take 1 tablet (50 mg total) by mouth every 8 (eight) hours as needed. (Patient not taking: Reported on 10/28/2024) 6 tablet 0   No current facility-administered medications for this visit.    OBJECTIVE: Vitals:   10/28/24 1047 10/28/24 1048  BP: (!) 151/97 (!) 133/90  Pulse: 66   Resp: 18   Temp: (!) 97.2 F (36.2 C)   SpO2: 98%      Body mass index is 30.9 kg/m.    ECOG FS:0 - Asymptomatic  General: Well-developed, well-nourished, no acute distress. Eyes: Pink conjunctiva, anicteric sclera. HEENT:  Normocephalic, moist mucous membranes. Lungs: No audible wheezing or coughing. Heart: Regular rate and rhythm. Abdomen: Soft, nontender, no obvious distention. Musculoskeletal: No edema, cyanosis, or clubbing. Neuro: Alert, answering all questions appropriately. Cranial nerves grossly intact. Skin: No rashes or petechiae noted. Psych: Normal affect.  LAB RESULTS:  Lab Results  Component Value Date   NA 136 05/06/2024   K 4.4 05/06/2024   CL 102 05/06/2024   CO2 24 05/06/2024   GLUCOSE 100 (H) 05/06/2024   BUN 18 05/06/2024   CREATININE 1.05 (H) 05/06/2024   CALCIUM  9.0 05/06/2024   PROT 6.9 05/06/2024   ALBUMIN 3.4 (L) 05/06/2024   AST 22 05/06/2024   ALT 12 05/06/2024   ALKPHOS 107 05/06/2024   BILITOT 0.3 05/06/2024   GFRNONAA 54 (L) 05/06/2024   GFRAA >90 08/16/2012    Lab Results  Component Value Date   WBC 3.9 (L) 10/16/2024   NEUTROABS 2.4 05/06/2024   HGB  13.4 10/16/2024   HCT 39.2 10/16/2024   MCV 91.0 10/16/2024   PLT 219 10/16/2024     STUDIES: No results found.   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  The invasive component of the patient's malignancy was only 0.6 cm, therefore Oncotype testing was not necessary.  She did not require adjuvant chemotherapy.  Patient completed adjuvant XRT in December 2025.  She was given a prescription for letrozole  today and recommended to take 5 years of treatment through December 2030.  No further intervention is needed.  Return to clinic in 3 months for routine evaluation.   Bone health: Will get a baseline bone mineral density in the next 1 to 2 weeks.   Patient expressed understanding and was in agreement with this plan. She also understands that She can call clinic at any time with any questions, concerns,  or complaints.    Cancer Staging  Invasive ductal carcinoma of left breast [facility]) Staging form: Breast, AJCC 8th Edition - Clinical stage from 07/31/2024: Stage IA (cT1a, cN0, cM0, G2, ER+, PR+, HER2-) - Signed by Jacobo Evalene PARAS, MD on 07/31/2024 Stage prefix: Initial diagnosis Histologic grading system: 3 grade system   Evalene PARAS Jacobo, MD   10/28/2024 3:22 PM

## 2024-10-29 NOTE — Radiation Completion Notes (Signed)
 Patient Name: Anita Reilly, Anita Reilly MRN: [id number] Date of Birth: 08/02/1946 Referring Physician: EVALENE REUSING, M.D. Date of Service: 2024-10-29 Radiation Oncologist: Marcey Penton, M.D.  [facility] [place]                             RADIATION ONCOLOGY END OF TREATMENT NOTE     Diagnosis: C50.912 Malignant neoplasm of unspecified site of left female breast Staging on 2024-07-31: Invasive ductal carcinoma of left breast (HCC) T=cT1a, N=cN0, M=cM0 Intent: Curative     HPI: Patient is a 78 year old female who presented with an abnormal screening mammogram.  This left diagnostic mammogram showing suspicious 5 mm mass in the left breast 12 o'clock position 7 cm to the nipple.  There is also enlarged left axillary lymph nodes.  She also had some subtle architectural distortion near the at area of suspicious malignancy.  She underwent core biopsy which was positive for ER positive PR borderline positive HER2 negative invasive mammary carcinoma overall grade 2 of 3.  1 lymph node showed granulomatous lymphadenitis.  She underwent a wide local excision and sentinel node biopsy for a 6 mm area of invasive ductal carcinoma.  Tumor was close margins were clear but close at 1 mm from the inferior margin.  5 lymph nodes were all negative for carcinoma 2 lymph nodes demonstrated again granulomatous lymphadenitis with necrosis.  Patient is tolerated her surgery well.  Based on the small size of her lesion she is not a candidate for systemic treatment.  She is seen today for radiation oncology opinion.  She states her breast is still somewhat sore in the area of excision.  She otherwise is without complaints.      ==========DELIVERED PLANS==========  First Treatment Date: 2024-09-26 Last Treatment Date: 2024-10-28   Plan Name: Breast_L Site: Breast, Left Technique: 3D Mode: Photon Dose Per Fraction: 2.66 Gy Prescribed Dose (Delivered / Prescribed): 42.56 Gy / 42.56 Gy Prescribed Fxs  (Delivered / Prescribed): 16 / 16   Plan Name: Breast_Lt_Bst Site: Breast, Left Technique: 3D Mode: Photon Dose Per Fraction: 2 Gy Prescribed Dose (Delivered / Prescribed): 10 Gy / 10 Gy Prescribed Fxs (Delivered / Prescribed): 5 / 5     ==========ON TREATMENT VISIT DATES========== 2024-10-01, 2024-10-08, 2024-10-15, 2024-10-22     ==========UPCOMING VISITS========== 11/27/2024 [facility] RAD ONCOLOGY FOLLOW UP 30 Chrystal, Marcey, MD  11/08/2024 [facility]  10/30/2024 [facility] Ancel Peters, OT        ==========APPENDIX - ON TREATMENT VISIT NOTES==========   See weekly On Treatment Notes in Epic for details in the Media tab (listed as Progress notes on the On Treatment Visit Dates listed above).

## 2024-10-30 ENCOUNTER — Inpatient Hospital Stay: Admitting: Occupational Therapy

## 2024-10-30 DIAGNOSIS — L905 Scar conditions and fibrosis of skin: Secondary | ICD-10-CM

## 2024-10-30 DIAGNOSIS — R293 Abnormal posture: Secondary | ICD-10-CM

## 2024-10-30 DIAGNOSIS — C50912 Malignant neoplasm of unspecified site of left female breast: Secondary | ICD-10-CM | POA: Diagnosis not present

## 2024-10-30 NOTE — Therapy (Signed)
 OUTPATIENT OCCUPATIONAL THERAPY BREAST CANCER TREATMENT   Patient Name: Anita Reilly MRN: [id number] DOB:1946/09/01, 78 y.o., female Today's Date: 10/30/2024  END OF SESSION:  OT End of Session - 10/30/24 1012     Visit Number 3    Number of Visits 5    Date for Recertification  11/06/24    OT Start Time 0930    OT Stop Time 1002    OT Time Calculation (min) 32 min    Activity Tolerance Patient tolerated treatment well    Behavior During Therapy [facility] for tasks assessed/performed          Past Medical History:  Diagnosis Date   Acute stroke due to ischemia (HCC) 04/27/2024   Arthritis    shoulders   Bell's palsy 2016   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/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   Vertigo    none in several years   Past Surgical History:  Procedure Laterality Date   AXILLARY SENTINEL NODE BIOPSY Left 08/29/2024   Procedure: BIOPSY, LYMPH NODE, SENTINEL, AXILLARY;  Surgeon: Tye Millet, DO;  Location: [facility];  Service: General;  Laterality: Left;   BREAST BIOPSY Left 05/06/2021   u/s bx, hydromark, path pending   BREAST BIOPSY Left 07/24/2024   US  LT BREAST BX W LOC DEV 1ST LESION IMG BX SPEC US  GUIDE 07/24/2024 [facility]   BREAST BIOPSY  08/06/2024   MM LT BREAST SAVI/RF TAG EA ADD'L LESION MAMMO GUIDE 08/06/2024 [facility]   BREAST BIOPSY  08/06/2024   MM LT BREAST SAVI/RF TAG 1ST LESION MAMMO GUIDE 08/06/2024 [facility]   BREAST LUMPECTOMY WITH RADIO FREQUENCY LOCALIZER Left 08/29/2024   Procedure: BREAST LUMPECTOMY WITH RADIO FREQUENCY LOCALIZER;  Surgeon: Tye Millet, DO;  Location: [facility];  Service: General;  Laterality: Left;  W/ RF tag   CARPAL TUNNEL RELEASE     left    CATARACT EXTRACTION W/PHACO Right 09/19/2018   Procedure: CATARACT EXTRACTION PHACO AND INTRAOCULAR LENS PLACEMENT (IOC) RIGHT;  Surgeon: Mittie Gaskin, MD;  Location: [facility];  Service: Ophthalmology;  Laterality:  Right;  latex sensitivity   CATARACT EXTRACTION W/PHACO Left 10/10/2018   Procedure: CATARACT EXTRACTION PHACO AND INTRAOCULAR LENS PLACEMENT (IOC)  LEFT;  Surgeon: Mittie Gaskin, MD;  Location: [facility];  Service: Ophthalmology;  Laterality: Left;  Latex sensitivity requests arrival around 10 or 10:30   COLONOSCOPY     Patient Active Problem List   Diagnosis Date Noted   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/31/2024   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  Just tired.  I ended radiation on Monday.  I am putting that lotion on the gave me.  The exercises you gave me were excellent.  The ones from the shoulders but also for my balance.  I am going to start walking with a friend of mine in January every day or at least 3 times a week at [employer].  PERTINENT HISTORY:  Patient was diagnosed with left  breast cancer - Pt had L lumpectomy by Dr Tye on 08/29/24  PATIENT GOALS:   reduce lymphedema risk and learn post op HEP.   PAIN:  Are you having pain?  No pain just postradiation soreness or tenderness PRECAUTIONS: Active CA ,L lymphedema    HAND DOMINANCE: right  WEIGHT BEARING RESTRICTIONS: No  FALLS:  Has patient fallen in last 6 months? No  LIVING ENVIRONMENT: Patient lives with: alone  OCCUPATION and LEISURE: Pt retired - prior to light stroke in June 25 pt  mow her own lawn and done yardwork - since then gets tired and still weakness in L side per pt - independent  Has son and daughter in town to assist as needed      OBJECTIVE:  COGNITION: Overall cognitive status: Within functional limits for tasks assessed    POSTURE:  Forward head and rounded shoulders posture- L more than R   UPPER EXTREMITY AROM/PROM: Bilateral UE AROM WFL - 160 flexion and ABD  Ext rotation 80 CERVICAL AROM: All within normal limits:     UPPER EXTREMITY STRENGTH: WFL   LYMPHEDEMA ASSESSMENTS: circumference NT today  L-DEX LYMPHEDEMA SCREENING:  The patient was assessed using the L-Dex machine today to produce a lymphedema index baseline score. The patient will be reassessed on a regular basis (typically every 3 months) to obtain new L-Dex scores. If the score is > 6.5 points away from his/her baseline score indicating onset of subclinical lymphedema, it will be recommended to wear a compression garment for 4 weeks, 12 hours per day and then be reassessed. If the score continues to be > 6.5 points from baseline at reassessment, we will initiate lymphedema treatment. Assessing in this manner has a 95% rate of preventing clinically significant lymphedema.   L-DEX FLOWSHEETS - 10/30/24 1000       L-DEX LYMPHEDEMA SCREENING   Measurement Type Unilateral    L-DEX MEASUREMENT EXTREMITY Upper Extremity    POSITION  Standing    DOMINANT SIDE Right    At Risk Side Left    BASELINE SCORE (UNILATERAL) 4.7    L-DEX SCORE (UNILATERAL) 1.4    VALUE CHANGE (UNILAT) -3.3            SESSION 10/30/24: Patient follow-up today after finished  radiation 2 days ago.  Patient's bilateral shoulder active range of motion within normal limits.  Patient reports scar tissue and pain better since seen last time.  Suggest redness and tenderness since finishing radiation.  Patient continues to do active assisted range of motion for shoulder flexion and abduction and external  rotation.  Encouraged for patient to continue with.  Balance test done patient at low risk for falling.  But patient do report still not having her endurance since she had a stroke and then finishing radiation.  Patient not interested at this time for exercise program but is going to start walking with a friend in January 3-5 times per week at the mall.  Reviewed with patient again signs and symptoms as well as  prevention precautions for lymphedema.  Education was done the first 2 sessions.  And handout was provided and reviewed.   L-Dex score was repeated again today and was within normal range.   PATIENT EDUCATION:  Education details: Lymphedema risk reduction and post op shoulder/posture HEP Person educated: Patient Education method: Explanation, Demonstration, Handout Education comprehension: Patient verbalized understanding and returned demonstration  ASSESSMENT:  CLINICAL IMPRESSION: Patient had left lumpectomy on 08/29/2024 by Dr. Tye.  Patient finished radiation 10/28/2024.  Patient shoulder active range of motion within normal range.  Strength 4+ to 5 -/5 for shoulder flexion abduction external rotation.  Patient not interested at this time with her exercise program.  Will put her on the list for [employer] program in May to contact her -  patient is going to start walking with her friend in January-  3 to 5 times a day.  Encourage patient to continue with active assisted range of motion for shoulder flexion abduction and external rotation.  Patient's L-Dex score for lymphedema prevention was within normal range.  No need for further follow-up with OT at this time.  Patient will continue to follow-up with breast navigator repeat L-Dex screens  every 3 months for 2 years to detect subclinical lymphedema.  Pt will benefit from skilled therapeutic intervention to improve on the following deficits: Decreased knowledge of precautions and lymphedema education, impaired UE functional use, pain,  decreased ROM, postural dysfunction.   OT treatment/interventions: ADL/self-care home management, pt/family education, therapeutic exercise,manual therapy  REHAB POTENTIAL: Good  CLINICAL DECISION MAKING: Stable/uncomplicated  EVALUATION COMPLEXITY: Low   GOALS: Goals reviewed with patient? YES  LONG TERM GOALS: (STG=LTG)    Name Target Date Goal status  1 Pt will be able to verbalize understanding of pertinent lymphedema risk reduction practices relevant to her dx specifically related to skin care.  Baseline:  No knowledge 6 weeks Met  2 Pt will be able to return demo and/or verbalize understanding of the post op HEP related to regaining shoulder ROM. Baseline:  No knowledge Today Met       4 Pt will demo she has regained full shoulder ROM and function post operatively compared to baselines.  Baseline: See objective measurements taken today. 12 weeks Met    PLAN:  OT FREQUENCY/DURATION: EVAL and 1-4 follow up appointments for 12 weeks.   PLAN FOR NEXT SESSION: will reassess 2-3  weeks post op to determine needs.   Patient will follow up at outpatient cancer rehab 3-4 weeks following surgery.  If the patient requires occupational therapy at that time, a specific plan will be dictated and sent to the referring physician for approval.  Occupational Therapy Information for After Breast Cancer Surgery/Treatment:  Lymphedema is a swelling condition that you may be at risk for in your arm if you have lymph nodes removed from the armpit area.  After a sentinel node biopsy, the risk is approximately 5-9% and is higher after an axillary node dissection.  There is treatment available for this condition and it is not life-threatening.  Contact your physician or occupational therapist with concerns. You may begin the 4 shoulder/posture exercises (see additional sheet) when permitted by your physician (typically a week after surgery).  If you have drains, you may need to wait until those are  removed before beginning range of motion exercises.  A general recommendation is to not lift your arms above shoulder height until drains are removed.  These exercises should be done to your tolerance and gently.  This is not a no pain/no gain type of recovery so listen to your body and stretch into the range of motion that you can tolerate, stopping if you have pain.  If you are having immediate reconstruction, ask your plastic surgeon about doing exercises as he or she may want you to wait. .  While undergoing any medical procedure or treatment, try to avoid blood pressure being taken or needle sticks from occurring on the arm on the side of cancer.   This recommendation begins after surgery and continues for the rest of your life.  This may help reduce your risk of getting lymphedema (swelling in your arm). An excellent resource for those seeking information on lymphedema is the National Lymphedema Network's web site. It can be accessed at www.lymphnet.org If you notice swelling in your hand, arm or breast at any time following surgery (even if it is many years from now), please contact your doctor or occupational therapist to discuss this.  Lymphedema can be treated at any time but it is easier for you if it is treated early on.  If you feel like your shoulder motion is not returning to normal in a reasonable amount of time, please contact your surgeon or occupational therapist.  [facility] [phone]. [address] [place], [place] [address]       Ancel Peters, OTR/L,CLT 10/30/2024, 10:14 AM

## 2024-10-31 ENCOUNTER — Encounter: Payer: Self-pay | Admitting: *Deleted

## 2024-11-08 ENCOUNTER — Ambulatory Visit
Admission: RE | Admit: 2024-11-08 | Discharge: 2024-11-08 | Disposition: A | Discharge status: Home / Self Care | Source: Ambulatory Visit | Attending: Oncology | Admitting: Oncology

## 2024-11-08 DIAGNOSIS — M8589 Other specified disorders of bone density and structure, multiple sites: Secondary | ICD-10-CM | POA: Insufficient documentation

## 2024-11-08 DIAGNOSIS — C50912 Malignant neoplasm of unspecified site of left female breast: Secondary | ICD-10-CM | POA: Diagnosis present

## 2024-11-27 ENCOUNTER — Encounter: Payer: Self-pay | Admitting: Radiation Oncology

## 2024-11-27 ENCOUNTER — Ambulatory Visit
Admission: RE | Admit: 2024-11-27 | Discharge: 2024-11-27 | Disposition: A | Discharge status: Home / Self Care | Source: Ambulatory Visit | Attending: Radiation Oncology | Admitting: Radiation Oncology

## 2024-11-27 VITALS — BP 136/81 | HR 67 | Temp 98.5°F | Resp 16 | Wt 178.0 lb

## 2024-11-27 DIAGNOSIS — Z923 Personal history of irradiation: Secondary | ICD-10-CM | POA: Diagnosis not present

## 2024-11-27 DIAGNOSIS — C50912 Malignant neoplasm of unspecified site of left female breast: Secondary | ICD-10-CM | POA: Insufficient documentation

## 2024-11-27 DIAGNOSIS — Z170 Estrogen receptor positive status [ER+]: Secondary | ICD-10-CM | POA: Diagnosis not present

## 2024-11-27 DIAGNOSIS — Z79811 Long term (current) use of aromatase inhibitors: Secondary | ICD-10-CM | POA: Insufficient documentation

## 2024-11-27 NOTE — Progress Notes (Signed)
 Radiation Oncology Follow up Note  Name: Anita Reilly   Date:   11/27/2024 MRN:  [id number] DOB: [birth year]    This 79 y.o. female presents to the clinic today for 1 month follow-up status post whole breast radiation to her left breast for stage Ia ER positive invasive mammary carcinoma.  REFERRING PROVIDER: Lenon Layman ORN, MD  HPI: Patient is a an 79 year old female now out 1 month having completed whole breast radiation to her left breast for stage Ia (pT1b N0 M0) invasive mammary carcinoma left breast seen today in routine follow-up she is doing well.  She specifically denies breast tenderness cough or bone pain..  She is currently on Femara  tolerating well without side effects.  COMPLICATIONS OF TREATMENT: none  FOLLOW UP COMPLIANCE: keeps appointments   PHYSICAL EXAM:  BP 136/81   Pulse 67   Temp 98.5 F (36.9 C) (Tympanic)   Resp 16   Wt 178 lb (80.7 kg)   BMI 30.55 kg/m  Lungs are clear to A&P cardiac examination essentially unremarkable with regular rate and rhythm. No dominant mass or nodularity is noted in either breast in 2 positions examined. Incision is well-healed. No axillary or supraclavicular adenopathy is appreciated. Cosmetic result is excellent.  Well-developed well-nourished patient in NAD. HEENT reveals PERLA, EOMI, discs not visualized.  Oral cavity is clear. No oral mucosal lesions are identified. Neck is clear without evidence of cervical or supraclavicular adenopathy. Lungs are clear to A&P. Cardiac examination is essentially unremarkable with regular rate and rhythm without murmur rub or thrill. Abdomen is benign with no organomegaly or masses noted. Motor sensory and DTR levels are equal and symmetric in the upper and lower extremities. Cranial nerves II through XII are grossly intact. Proprioception is intact. No peripheral adenopathy or edema is identified. No motor or sensory levels are noted. Crude visual fields are within normal  range.  RADIOLOGY RESULTS: No current films for review  PLAN: At the present time patient is doing well 1 month out from whole breast radiation and pleased with her overall progress.  Of asked to see her back in 6 months for follow-up.  Patient knows to call sooner with any concerns.  She continues on Femara  without side effect.  I would like to take this opportunity to thank you for allowing me to participate in the care of your patient.[name], MD

## 2024-11-27 NOTE — Progress Notes (Signed)
 Survivorship Care Plan visit completed.  Treatment summary reviewed and given to patient.  ASCO answers booklet reviewed and given to patient.  CARE program and Cancer Transitions discussed with patient along with other resources cancer center offers to patients and caregivers.  Patient verbalized understanding.

## 2024-12-24 ENCOUNTER — Encounter: Payer: Self-pay | Admitting: *Deleted

## 2024-12-24 NOTE — Progress Notes (Signed)
 Anita Reilly called with complaints of joint achiness in arms/shoulders/feet.   She is asking if it is ok to stop the letrozole  until she sees Dr. Jacobo in March.  Per Dr. Jacobo it is ok to stop letrozole  until her next appt.

## 2025-02-03 ENCOUNTER — Inpatient Hospital Stay: Admitting: *Deleted

## 2025-02-03 ENCOUNTER — Inpatient Hospital Stay: Admitting: Oncology

## 2025-02-04 ENCOUNTER — Inpatient Hospital Stay: Admitting: Oncology

## 2025-02-04 ENCOUNTER — Inpatient Hospital Stay: Admitting: *Deleted

## 2025-05-27 ENCOUNTER — Ambulatory Visit: Admitting: Radiation Oncology
